# Patient Record
Sex: Female | Born: 1955 | Race: Black or African American | Hispanic: No | Marital: Married | Smoking: Former smoker
Health system: Western US, Academic
[De-identification: ages and names within clinical notes are randomized; demographics above are authoritative.]

## PROBLEM LIST (undated history)

## (undated) ENCOUNTER — Ambulatory Visit (HOSPITAL_BASED_OUTPATIENT_CLINIC_OR_DEPARTMENT_OTHER)
Admission: RE | Payer: No Typology Code available for payment source | Source: Ambulatory Visit | Admitting: Pain Medicine

## (undated) ENCOUNTER — Encounter (HOSPITAL_BASED_OUTPATIENT_CLINIC_OR_DEPARTMENT_OTHER): Admission: RE | Payer: Self-pay | Source: Ambulatory Visit

## (undated) DIAGNOSIS — I10 Essential (primary) hypertension: Secondary | ICD-10-CM

## (undated) DIAGNOSIS — R011 Cardiac murmur, unspecified: Secondary | ICD-10-CM

## (undated) DIAGNOSIS — E119 Type 2 diabetes mellitus without complications: Secondary | ICD-10-CM

## (undated) DIAGNOSIS — M1990 Unspecified osteoarthritis, unspecified site: Secondary | ICD-10-CM

## (undated) DIAGNOSIS — K5792 Diverticulitis of intestine, part unspecified, without perforation or abscess without bleeding: Secondary | ICD-10-CM

## (undated) DIAGNOSIS — D869 Sarcoidosis, unspecified: Secondary | ICD-10-CM

## (undated) DIAGNOSIS — D860 Sarcoidosis of lung: Secondary | ICD-10-CM

## (undated) HISTORY — PX: CHOLECYSTECTOMY: SHX55

## (undated) HISTORY — PX: ABDOMINAL EXPLORATION SURGERY: SHX538

## (undated) HISTORY — PX: ABDOMINAL HYSTERECTOMY: SHX81

## (undated) HISTORY — PX: APPENDECTOMY: SHX54

## (undated) HISTORY — PX: KNEE SURGERY: SHX244

## (undated) HISTORY — PX: HYSTERECTOMY: SHX81

## (undated) HISTORY — PX: KNEE ARTHROSCOPY: SHX1858B

## (undated) HISTORY — DX: Essential (primary) hypertension: I10

## (undated) HISTORY — DX: Sarcoidosis of lung (CMS-HCC): D86.0

## (undated) HISTORY — DX: Type 2 diabetes mellitus without complications (CMS-HCC): E11.9

## (undated) SURGERY — PAIN TPI (2 OR MORE MUSCLES)
Laterality: Bilateral

## (undated) MED ORDER — ALPRAZOLAM 0.5 MG OR TABS
ORAL_TABLET | ORAL | Status: AC
Start: 2020-10-12 — End: ?

## (undated) MED ORDER — LOSARTAN POTASSIUM 50 MG OR TABS
50.00 mg | ORAL_TABLET | Freq: Every day | ORAL | 0 refills | Status: AC
Start: 2022-02-18 — End: ?

## (undated) MED ORDER — INSULIN LISPRO (1 UNIT DIAL) 100 UNIT/ML SC SOPN
PEN_INJECTOR | SUBCUTANEOUS | 5 refills | Status: AC
Start: 2021-09-20 — End: ?

## (undated) MED ORDER — LOSARTAN POTASSIUM 50 MG OR TABS
50.00 mg | ORAL_TABLET | Freq: Every day | ORAL | 0 refills | Status: AC
Start: 2022-07-20 — End: ?

## (undated) MED ORDER — PREGABALIN 75 MG OR CAPS
ORAL_CAPSULE | ORAL | Status: AC
Start: 2021-11-05 — End: ?

## (undated) MED ORDER — PAROXETINE HCL 10 MG OR TABS
10.0000 mg | ORAL_TABLET | Freq: Every day | ORAL | 1 refills | Status: AC
Start: 2020-11-16 — End: ?

## (undated) MED ORDER — DEXCOM G6 SENSOR MISC
4 refills | Status: AC
Start: 2020-12-12 — End: ?

## (undated) MED ORDER — INSULIN GLARGINE SOLOSTAR 100 UNIT/ML SC SOPN
55.00 [IU] | PEN_INJECTOR | Freq: Every evening | SUBCUTANEOUS | 3 refills | Status: AC
Start: 2021-10-30 — End: ?

## (undated) MED ORDER — PREDNISONE 10 MG OR TABS
10.00 mg | ORAL_TABLET | Freq: Every day | ORAL | 1 refills | Status: AC
Start: 2021-05-16 — End: ?

## (undated) MED ORDER — ALPRAZOLAM 0.5 MG OR TABS
0.2500 mg | ORAL_TABLET | Freq: Every evening | ORAL | 0 refills | Status: AC | PRN
Start: 2020-11-16 — End: ?

## (undated) MED ORDER — TECHLITE PEN NEEDLES 32G X 4 MM MISC
1.00 | Freq: Every day | 3 refills | Status: AC
Start: 2021-10-30 — End: ?

## (undated) MED ORDER — LANTUS SOLOSTAR 100 UNIT/ML SC SOLN
PEN_INJECTOR | SUBCUTANEOUS | 0 refills | Status: AC
Start: 2022-05-27 — End: ?

## (undated) MED ORDER — PREGABALIN 75 MG OR CAPS
ORAL_CAPSULE | ORAL | Status: AC
Start: 2021-04-04 — End: ?

## (undated) MED ORDER — DEXCOM G6 RECEIVER DEVI
0 refills | Status: AC
Start: 2020-11-30 — End: ?

## (undated) MED ORDER — MUPIROCIN 2 % EX OINT
1.00 | TOPICAL_OINTMENT | Freq: Two times a day (BID) | CUTANEOUS | 1 refills | Status: AC
Start: 2021-09-20 — End: ?

## (undated) MED ORDER — OXYCODONE-ACETAMINOPHEN 10-325 MG OR TABS
0.50 | ORAL_TABLET | Freq: Four times a day (QID) | ORAL | 0 refills | Status: AC | PRN
Start: 2021-10-30 — End: ?

## (undated) MED ORDER — ELIQUIS 5 MG PO TABS
5.0000 mg | ORAL_TABLET | Freq: Two times a day (BID) | ORAL | 1 refills | Status: AC
Start: 2023-03-19 — End: ?

## (undated) MED ORDER — DEXCOM G6 TRANSMITTER MISC
0 refills | Status: AC
Start: 2020-10-26 — End: ?

## (undated) MED ORDER — MIRTAZAPINE 15 MG OR TABS
15.0000 mg | ORAL_TABLET | Freq: Every evening | ORAL | 1 refills | Status: AC | PRN
Start: 2022-03-19 — End: ?

## (undated) MED ORDER — DEXCOM G6 TRANSMITTER MISC
4 refills | Status: AC
Start: 2020-12-12 — End: ?

## (undated) MED ORDER — DEXCOM G6 SENSOR MISC
0 refills | Status: AC
Start: 2020-10-26 — End: ?

## (undated) MED ORDER — LOSARTAN POTASSIUM 50 MG OR TABS
ORAL_TABLET | ORAL | 1 refills | Status: AC
Start: 2021-02-05 — End: ?

## (undated) MED ORDER — OXYCODONE-ACETAMINOPHEN 10-325 MG OR TABS
1.0000 | ORAL_TABLET | Freq: Three times a day (TID) | ORAL | 0 refills | Status: AC | PRN
Start: 2020-10-12 — End: ?

## (undated) MED ORDER — INSULIN GLARGINE SOLOSTAR 100 UNIT/ML SC SOPN
PEN_INJECTOR | SUBCUTANEOUS | 0 refills | Status: AC
Start: 2021-09-25 — End: ?

## (undated) MED ORDER — HYDROXYZINE HCL 50 MG OR TABS
ORAL_TABLET | ORAL | 1 refills | Status: AC
Start: 2020-11-23 — End: ?

## (undated) MED ORDER — LOSARTAN POTASSIUM 50 MG OR TABS
50.0000 mg | ORAL_TABLET | Freq: Every day | ORAL | 3 refills | Status: AC
Start: 2020-11-05 — End: ?

## (undated) MED ORDER — GUAIFENESIN-CODEINE 100-10 MG/5ML OR SOLN
5.00 mL | Freq: Four times a day (QID) | ORAL | 0 refills | Status: AC | PRN
Start: 2022-02-06 — End: ?

## (undated) MED ORDER — NOVOLOG FLEXPEN 100 UNIT/ML SC SOPN
PEN_INJECTOR | SUBCUTANEOUS | 11 refills | Status: AC
Start: 2021-06-10 — End: ?

## (undated) MED ORDER — OXYCODONE-ACETAMINOPHEN 10-325 MG OR TABS
0.5000 | ORAL_TABLET | Freq: Four times a day (QID) | ORAL | 0 refills | Status: AC | PRN
Start: 2021-10-21 — End: ?

## (undated) MED ORDER — PREGABALIN 75 MG OR CAPS
ORAL_CAPSULE | ORAL | 2 refills | Status: AC
Start: 2021-10-21 — End: ?

## (undated) MED ORDER — ZOSTER VAC RECOMB ADJUVANTED 50 MCG/0.5ML IM SUSR
0.50 mL | Freq: Once | INTRAMUSCULAR | 1 refills | Status: AC
Start: 2020-07-09 — End: 2020-07-09

---

## 2015-11-01 ENCOUNTER — Observation Stay: Payer: Self-pay

## 2015-11-01 ENCOUNTER — Encounter: Payer: Self-pay | Admitting: Medical Oncology

## 2015-11-01 ENCOUNTER — Emergency Department: Payer: Self-pay

## 2015-11-01 ENCOUNTER — Observation Stay
Admission: EM | Admit: 2015-11-01 | Discharge: 2015-11-02 | Disposition: A | Discharge status: Home / Self Care | Payer: Self-pay | Attending: Internal Medicine | Admitting: Internal Medicine

## 2015-11-01 DIAGNOSIS — D869 Sarcoidosis, unspecified: Secondary | ICD-10-CM | POA: Insufficient documentation

## 2015-11-01 DIAGNOSIS — G459 Transient cerebral ischemic attack, unspecified: Secondary | ICD-10-CM

## 2015-11-01 DIAGNOSIS — Z882 Allergy status to sulfonamides status: Secondary | ICD-10-CM | POA: Insufficient documentation

## 2015-11-01 DIAGNOSIS — I700 Atherosclerosis of aorta: Secondary | ICD-10-CM | POA: Insufficient documentation

## 2015-11-01 DIAGNOSIS — K5730 Diverticulosis of large intestine without perforation or abscess without bleeding: Secondary | ICD-10-CM | POA: Insufficient documentation

## 2015-11-01 DIAGNOSIS — E119 Type 2 diabetes mellitus without complications: Secondary | ICD-10-CM | POA: Insufficient documentation

## 2015-11-01 DIAGNOSIS — I10 Essential (primary) hypertension: Secondary | ICD-10-CM | POA: Insufficient documentation

## 2015-11-01 DIAGNOSIS — Z90710 Acquired absence of both cervix and uterus: Secondary | ICD-10-CM | POA: Insufficient documentation

## 2015-11-01 DIAGNOSIS — F1910 Other psychoactive substance abuse, uncomplicated: Secondary | ICD-10-CM

## 2015-11-01 DIAGNOSIS — R51 Headache: Secondary | ICD-10-CM

## 2015-11-01 DIAGNOSIS — I371 Nonrheumatic pulmonary valve insufficiency: Secondary | ICD-10-CM | POA: Insufficient documentation

## 2015-11-01 DIAGNOSIS — I083 Combined rheumatic disorders of mitral, aortic and tricuspid valves: Secondary | ICD-10-CM | POA: Insufficient documentation

## 2015-11-01 DIAGNOSIS — R0789 Other chest pain: Secondary | ICD-10-CM

## 2015-11-01 DIAGNOSIS — E785 Hyperlipidemia, unspecified: Secondary | ICD-10-CM | POA: Insufficient documentation

## 2015-11-01 DIAGNOSIS — R079 Chest pain, unspecified: Secondary | ICD-10-CM

## 2015-11-01 DIAGNOSIS — Z9049 Acquired absence of other specified parts of digestive tract: Secondary | ICD-10-CM | POA: Insufficient documentation

## 2015-11-01 DIAGNOSIS — R42 Dizziness and giddiness: Secondary | ICD-10-CM | POA: Insufficient documentation

## 2015-11-01 DIAGNOSIS — R202 Paresthesia of skin: Secondary | ICD-10-CM

## 2015-11-01 DIAGNOSIS — R011 Cardiac murmur, unspecified: Secondary | ICD-10-CM | POA: Insufficient documentation

## 2015-11-01 DIAGNOSIS — G43909 Migraine, unspecified, not intractable, without status migrainosus: Principal | ICD-10-CM | POA: Insufficient documentation

## 2015-11-01 DIAGNOSIS — R1032 Left lower quadrant pain: Secondary | ICD-10-CM

## 2015-11-01 DIAGNOSIS — Z79899 Other long term (current) drug therapy: Secondary | ICD-10-CM | POA: Insufficient documentation

## 2015-11-01 DIAGNOSIS — G894 Chronic pain syndrome: Secondary | ICD-10-CM | POA: Insufficient documentation

## 2015-11-01 DIAGNOSIS — Z7982 Long term (current) use of aspirin: Secondary | ICD-10-CM | POA: Insufficient documentation

## 2015-11-01 DIAGNOSIS — I4581 Long QT syndrome: Secondary | ICD-10-CM | POA: Insufficient documentation

## 2015-11-01 DIAGNOSIS — R519 Headache, unspecified: Secondary | ICD-10-CM

## 2015-11-01 DIAGNOSIS — M1990 Unspecified osteoarthritis, unspecified site: Secondary | ICD-10-CM | POA: Insufficient documentation

## 2015-11-01 HISTORY — DX: Unspecified osteoarthritis, unspecified site: M19.90

## 2015-11-01 HISTORY — DX: Diverticulitis of intestine, part unspecified, without perforation or abscess without bleeding: K57.92

## 2015-11-01 HISTORY — DX: Essential (primary) hypertension: I10

## 2015-11-01 HISTORY — DX: Cardiac murmur, unspecified: R01.1

## 2015-11-01 HISTORY — DX: Sarcoidosis, unspecified: D86.9

## 2015-11-01 LAB — DIFFERENTIAL
BASOS ABS: 0.1 10*3/uL (ref 0–0.1)
BASOS PCT: 2 %
EOS ABS: 0.2 10*3/uL (ref 0–0.7)
Eosinophils Relative: 4 %
Lymphocytes Relative: 32 %
Lymphs Abs: 1.8 10*3/uL (ref 1.0–3.6)
MONOS PCT: 14 %
Monocytes Absolute: 0.8 10*3/uL (ref 0.2–0.9)
NEUTROS ABS: 2.7 10*3/uL (ref 1.4–6.5)
Neutrophils Relative %: 48 %

## 2015-11-01 LAB — URINE DRUG SCREEN, QUALITATIVE (ARMC ONLY)
Amphetamines, Ur Screen: NOT DETECTED
BARBITURATES, UR SCREEN: NOT DETECTED
BENZODIAZEPINE, UR SCRN: NOT DETECTED
CANNABINOID 50 NG, UR ~~LOC~~: NOT DETECTED
Cocaine Metabolite,Ur ~~LOC~~: NOT DETECTED
MDMA (ECSTASY) UR SCREEN: NOT DETECTED
Methadone Scn, Ur: NOT DETECTED
Opiate, Ur Screen: POSITIVE — AB
PHENCYCLIDINE (PCP) UR S: NOT DETECTED
TRICYCLIC, UR SCREEN: POSITIVE — AB

## 2015-11-01 LAB — CBC
HCT: 39.7 % (ref 35.0–47.0)
Hemoglobin: 13.4 g/dL (ref 12.0–16.0)
MCH: 29.5 pg (ref 26.0–34.0)
MCHC: 33.8 g/dL (ref 32.0–36.0)
MCV: 87.1 fL (ref 80.0–100.0)
PLATELETS: 241 10*3/uL (ref 150–440)
RBC: 4.56 MIL/uL (ref 3.80–5.20)
RDW: 13.8 % (ref 11.5–14.5)
WBC: 5.5 10*3/uL (ref 3.6–11.0)

## 2015-11-01 LAB — COMPREHENSIVE METABOLIC PANEL
ALT: 19 U/L (ref 14–54)
AST: 21 U/L (ref 15–41)
Albumin: 4.5 g/dL (ref 3.5–5.0)
Alkaline Phosphatase: 78 U/L (ref 38–126)
Anion gap: 8 (ref 5–15)
BUN: 15 mg/dL (ref 6–20)
CHLORIDE: 103 mmol/L (ref 101–111)
CO2: 27 mmol/L (ref 22–32)
CREATININE: 0.66 mg/dL (ref 0.44–1.00)
Calcium: 9.5 mg/dL (ref 8.9–10.3)
Glucose, Bld: 88 mg/dL (ref 65–99)
Potassium: 3.8 mmol/L (ref 3.5–5.1)
Sodium: 138 mmol/L (ref 135–145)
TOTAL PROTEIN: 7.8 g/dL (ref 6.5–8.1)
Total Bilirubin: 0.6 mg/dL (ref 0.3–1.2)

## 2015-11-01 LAB — PROTIME-INR
INR: 0.97
PROTHROMBIN TIME: 12.9 s (ref 11.4–15.2)

## 2015-11-01 LAB — TROPONIN I

## 2015-11-01 LAB — SEDIMENTATION RATE: Sed Rate: 23 mm/hr (ref 0–30)

## 2015-11-01 LAB — APTT: APTT: 31 s (ref 24–36)

## 2015-11-01 LAB — GLUCOSE, CAPILLARY: Glucose-Capillary: 71 mg/dL (ref 65–99)

## 2015-11-01 MED ORDER — BUTALBITAL-APAP-CAFFEINE 50-325-40 MG PO TABS
1.0000 | ORAL_TABLET | Freq: Once | ORAL | Status: AC
  Administered 2015-11-01: 20:00:00 1 via ORAL
  Filled 2015-11-01: qty 1

## 2015-11-01 MED ORDER — STROKE: EARLY STAGES OF RECOVERY BOOK
Freq: Once | Status: AC
  Administered 2015-11-01: 20:00:00

## 2015-11-01 MED ORDER — SENNOSIDES-DOCUSATE SODIUM 8.6-50 MG PO TABS: 1.0000 | ORAL_TABLET | Freq: Every evening | ORAL | Status: DC | PRN

## 2015-11-01 MED ORDER — KETOROLAC TROMETHAMINE 30 MG/ML IJ SOLN
30.0000 mg | Freq: Once | INTRAMUSCULAR | Status: DC
Start: 2015-11-01 — End: 2015-11-02
  Filled 2015-11-01 (×2): qty 1

## 2015-11-01 MED ORDER — LOSARTAN POTASSIUM 50 MG PO TABS
100.0000 mg | ORAL_TABLET | Freq: Every day | ORAL | Status: DC
  Administered 2015-11-02: 100 mg via ORAL
  Filled 2015-11-01: qty 2

## 2015-11-01 MED ORDER — OXYCODONE HCL 5 MG PO TABS
5.0000 mg | ORAL_TABLET | Freq: Four times a day (QID) | ORAL | Status: DC | PRN
  Administered 2015-11-01 – 2015-11-02 (×2): 5 mg via ORAL
  Filled 2015-11-01 (×2): qty 1

## 2015-11-01 MED ORDER — ASPIRIN EC 81 MG PO TBEC
81.0000 mg | DELAYED_RELEASE_TABLET | Freq: Every day | ORAL | Status: DC
Start: 2015-11-02 — End: 2015-11-02
  Administered 2015-11-02: 11:00:00 81 mg via ORAL
  Filled 2015-11-01: qty 1

## 2015-11-01 MED ORDER — ENOXAPARIN SODIUM 40 MG/0.4ML ~~LOC~~ SOLN
40.0000 mg | SUBCUTANEOUS | Status: DC
  Administered 2015-11-01: 40 mg via SUBCUTANEOUS
  Filled 2015-11-01: qty 0.4

## 2015-11-01 MED ORDER — SODIUM CHLORIDE 0.9 % IV SOLN
INTRAVENOUS | Status: DC
  Administered 2015-11-01: 20:00:00 via INTRAVENOUS

## 2015-11-01 MED ORDER — ONDANSETRON HCL 4 MG/2ML IJ SOLN
4.0000 mg | Freq: Once | INTRAMUSCULAR | Status: AC
  Administered 2015-11-01: 4 mg via INTRAVENOUS
  Filled 2015-11-01: qty 2

## 2015-11-01 MED ORDER — MORPHINE SULFATE (PF) 4 MG/ML IV SOLN
4.0000 mg | Freq: Once | INTRAVENOUS | Status: AC
  Administered 2015-11-01: 4 mg via INTRAVENOUS
  Filled 2015-11-01: qty 1

## 2015-11-01 NOTE — ED Notes (Signed)
[facility]

## 2015-11-01 NOTE — ED Notes (Signed)
[facility]16. Notified that it was specimens from code stroke.

## 2015-11-01 NOTE — ED Notes (Signed)
Pt back from CT

## 2015-11-01 NOTE — ED Triage Notes (Signed)
Pt reports that she began having headache around 1130 today. Pt states that with the headache she began having dizziness and tingling to the left side of her face. Pt talking in complete sentences.

## 2015-11-01 NOTE — H&P (Signed)
[place] @ [facility] Admission History and Physical [employer], D.O.  ---------------------------------------------------------------------------------------------------------------------   PATIENT NAMEJaquila Woods MR#: [id number] DATE OF BIRTH: Nov 15, 1955 DATE OF ADMISSION: 11/01/2015 PRIMARY CARE PHYSICIAN: No PCP Per Patient  REQUESTING/REFERRING PHYSICIAN: ED Dr. Cinda Quest   CHIEF COMPLAINT: Chief Complaint  Patient presents with  . Migraine  . Numbness    HISTORY OF PRESENT ILLNESS: Adriana Woods is a 60 y.o. female with a known history of HTN, sarcoidosis, OA was in a usual state of health until  11:30 this morning when she began experiencing headache associated with dizziness and tingling on the left side of her face in the temporal region. Symptoms are also associated with photophobia, but no nausea or vomiting.    She also reports that as the day is going on her left left feels "heavy" and weak.   She also reports central chest pressure which she states is chronic for her and is usually attributed to a strained muscle or reflux.  Pain is dull aching, nonradiating and not associated with SOB or palpitations.    Finally she complains of left lower quadrant abdominal pain similar to her diverticulitis in the past. Bowel movements have been normal, nonbloody with no diarrhea.   She has recently travelled to and from [place] for a funeral where she was in contact with her sick sister, but she herself has not been ill.   Otherwise there has been no change in status. Patient has been taking medication as prescribed and there has been no recent change in medication or diet.    Patient denies fevers/chills, weakness, shortness of breath, N/V/C/D, dysuria/frequency, changes in mental status.   PAST MEDICAL HISTORY: Past Medical History:  Diagnosis Date  . Arthritis   . Diverticulitis   . Heart murmur   . Hypertension   . Sarcoidosis ([place])        PAST SURGICAL HISTORY: Past Surgical History:  Procedure Laterality Date  . ABDOMINAL EXPLORATION SURGERY    . ABDOMINAL HYSTERECTOMY    . APPENDECTOMY    . CHOLECYSTECTOMY    . KNEE SURGERY        SOCIAL HISTORY: Social History  Substance Use Topics  . Smoking status: Never Smoker  . Smokeless tobacco: Never Used  . Alcohol use No     FAMILY HISTORY: No family history on file.   MEDICATIONS AT HOME: Prior to Admission medications   Medication Sig Start Date End Date Taking? Authorizing Provider  aspirin EC 81 MG tablet Take 81 mg by mouth daily.   Yes Historical Provider, MD  losartan (COZAAR) 100 MG tablet Take 100 mg by mouth daily.   Yes Historical Provider, MD      DRUG ALLERGIES: Allergies  Allergen Reactions  . Sulfa Antibiotics Itching and Swelling     REVIEW OF SYSTEMS: CONSTITUTIONAL: No fever/chills. No fatigue, weakness. No weight gain, no weight loss. (+) headache EYES: No blurry or double vision. (+) photophobia ENT: No tinnitus. No postnasal drip. No redness or soreness of the oropharynx. RESPIRATORY: No cough, no wheeze, no hemoptysis. No dyspnea. CARDIOVASCULAR: (+) chest pain. No orthopnea. No palpitations. No syncope. GASTROINTESTINAL: No nausea, no vomiting or diarrhea. No abdominal pain. No melena or hematochezia. GENITOURINARY: No dysuria or hematuria. ENDOCRINE: No polyuria or nocturia. No heat or cold intolerance. HEMATOLOGY: No anemia. No bruising. No bleeding. INTEGUMENTARY: No rashes. No lesions. MUSCULOSKELETAL: No arthritis. No swelling. No gout. NEUROLOGIC: (+) numbness/tingling, weakness or ataxia. No seizure-type activity. PSYCHIATRIC: No  anxiety. No depression. No insomnia.  PHYSICAL EXAMINATION: VITAL SIGNS: Blood pressure (!) 143/82, pulse 88, temperature 97.9 F (36.6 C), resp. rate 14, height 5' 4" (1.626 m), weight 88.9 kg (196 lb), SpO2 98 %.  GENERAL: 60 y.o.-year-old black female patient, well-developed,  well-nourished lying in the bed in no acute distress. EYES: Pupils equal, round, reactive to light and accommodation. (+) light sensitivity No scleral icterus. Extraocular muscles intact. HEENT: Head atraumatic, normocephalic. Oropharynx and nasopharynx clear. Mucus membranes moist.  (+) left temporal tenderness to palpation.   NECK: Supple, full range of motion. No JVD, no bruit heard. No thyroid enlargement, no tenderness, no lymphadenopathy. CHEST: Normal breath sounds bilaterally, no wheezing, rales, rhonchi or crepitation. No use of accessory muscles of respiration.  No reproducible chest wall tenderness.  CARDIOVASCULAR: S1, S2 normal. No murmurs, rubs, or gallops. Cap refill <2 seconds. ABDOMEN: Soft, nontender, nondistended. No rebound, guarding, rigidity. Normoactive bowel sounds present in all four quadrants. No organomegaly or mass. EXTREMITIES: Full range of motion. No pedal edema, cyanosis, or clubbing.  NEUROLOGIC: Cranial nerves II through XII are grossly intact with no focal sensorimotor deficit. Muscle strength 5/5 in all extremities. Sensation intact. Gait not checked.  RLE strength 5/5, LLE strength 4/5.  PSYCHIATRIC: The patient is alert and oriented x 3. Normal affect, mood, thought content. SKIN: Warm, dry, and intact without obvious rash, lesion, or ulcer.  LABORATORY PANEL:  CBC  Recent Labs Lab 11/01/15 1612  WBC 5.5  HGB 13.4  HCT 39.7  PLT 241   ----------------------------------------------------------------------------------------------------------------- Chemistries  Recent Labs Lab 11/01/15 1612  NA 138  K 3.8  CL 103  CO2 27  GLUCOSE 88  BUN 15  CREATININE 0.66  CALCIUM 9.5  AST 21  ALT 19  ALKPHOS 78  BILITOT 0.6   ------------------------------------------------------------------------------------------------------------------ Cardiac Enzymes  Recent Labs Lab 11/01/15 1612  TROPONINI <0.03    ------------------------------------------------------------------------------------------------------------------  RADIOLOGY: Ct Head Code Stroke W/o Cm  Result Date: 11/01/2015 CLINICAL DATA:  Code stroke. 60 year old female with headache, dizziness and left face tingling beginning at 1130 hours today. Initial encounter. EXAM: CT HEAD WITHOUT CONTRAST TECHNIQUE: Contiguous axial images were obtained from the base of the skull through the vertex without intravenous contrast. COMPARISON:  None. FINDINGS: Mild right sphenoid sinus mucosal thickening. Other visible sinuses and mastoid air cells are clear. No acute osseous abnormality identified. Negative orbit and scalp soft tissues. Calcified atherosclerosis at the skull base. Cerebral volume is within normal limits for age. No midline shift, ventriculomegaly, mass effect, evidence of mass lesion, intracranial hemorrhage or evidence of cortically based acute infarction. Gray-white matter differentiation is within normal limits throughout the brain. No suspicious intracranial vascular hyperdensity. ASPECTS Total score (0-10 with 10 being normal): 10 IMPRESSION: 1. Normal for age noncontrast CT appearance of the brain. 2. ASPECTS score 10 3. Study discussed by telephone with Dr. Wells Guiles LORD on 11/01/2015 at 16:26 . Electronically Signed   By: Genevie Ann M.D.   On: 11/01/2015 16:28    EKG: Sinus with LAD and non specific ST T wave changes.    IMPRESSION AND PLAN:  This is a 60 y.o. female with a history of HTN. OA, sarcoidosis now being admitted with: 1. TIA - Teleneurology recommendations appreciated.  Will admit to telemetry for workup including MRI, MRA, echo, carotids, lipids, TFTs. Aspirin already rec'd in ED will continue 81 mg daily.  2. Chest pain rule out ACS - trend troponins, cardiology consult.  3. Headache - likely migraine, rule out  temporal arteritis.  Will give trial of Fioricet and check ESR.  4. LLQ abdominal pain - Chest CT abdomen/  pelvis, UA.   Fluids: IVNS Diet/Nutrition: Heart healthy pending swallow screen DVT Px: Lovenox, SCDs  All the records are reviewed and case discussed with ED provider. Management plans discussed with the patient and/or family who express understanding and agree with plan of care.  CODE STATUS: Full  TOTAL TIME TAKING CARE OF THIS PATIENT: 60 minutes.   Ajayla Iglesias D.O. on 11/01/2015 at 5:56 PM Between 7am to 6pm - Pager - [phone] After 6pm go to [email or web address] - [employer] [place] [facility] Office [phone] CC: Primary care physician; No PCP Per Patient     Note: This dictation was prepared with Dragon dictation along with smaller phrase technology. Any transcriptional errors that result from this process are unintentional.

## 2015-11-01 NOTE — ED Notes (Signed)
EDP at bedside. Pt reports headache left sided facial numbness and tingling that began at 1100 today. Pt reports neck tenderness.

## 2015-11-01 NOTE — ED Notes (Signed)
Report to Malka, RN.  

## 2015-11-01 NOTE — ED Provider Notes (Signed)
[facility] Department Provider Note   ____________________________________________   First MD Initiated Contact with Patient 11/01/15 1621     (approximate)  I have reviewed the triage vital signs and the nursing notes.   HISTORY  Chief Complaint Migraine and Numbness    HPI Adriana Woods is a 60 y.o. female who reports gradual onset of a headache starting with neck tightness progressing up into the head at about 11:30 this morning she did then became somewhat dizzy feeling and had tingling in the left side of her face. At present she still has a bad headache which is mostly left-sided and tingling on the left side of her face. The headache is just a headache. Tingling feels like her face made a call to sleep. She thinks she may have a little bit of left leg weakness. Patient says the tingling is better than it was. Patient also reports a little bit of chest heaviness and left lower quadrant abdominal pain. The left lower quadrant abdominal pain has been going on for several days. She says she has a history of diabetes particular is.   Past Medical History:  Diagnosis Date  . Arthritis   . Diverticulitis   . Heart murmur   . Hypertension   . Sarcoidosis ([facility])     There are no active problems to display for this patient.   Past Surgical History:  Procedure Laterality Date  . ABDOMINAL EXPLORATION SURGERY    . ABDOMINAL HYSTERECTOMY    . APPENDECTOMY    . CHOLECYSTECTOMY    . KNEE SURGERY      Prior to Admission medications   Medication Sig Start Date End Date Taking? Authorizing Provider  aspirin EC 81 MG tablet Take 81 mg by mouth daily.   Yes Historical Provider, MD  losartan (COZAAR) 100 MG tablet Take 100 mg by mouth daily.   Yes Historical Provider, MD    Allergies Sulfa antibiotics  No family history on file.  Social History Social History  Substance Use Topics  . Smoking status: Never Smoker  . Smokeless tobacco:  Never Used  . Alcohol use No    Review of Systems Constitutional: No fever/chills Eyes: No visual changes. ENT: No sore throat. Cardiovascular:chest pain. Respiratory: Denies shortness of breath. Gastrointestinal: No abdominal pain.  No nausea, no vomiting.  No diarrhea.  No constipation. Genitourinary: Negative for dysuria. Musculoskeletal: Negative for back pain. Skin: Negative for rash. Neurological: Negative for headaches, focal weakness or numbness.  10-point ROS otherwise negative.  ____________________________________________   PHYSICAL EXAM:  VITAL SIGNS: ED Triage Vitals  Enc Vitals Group     BP 11/01/15 1603 132/73     Pulse Rate 11/01/15 1603 84     Resp 11/01/15 1603 18     Temp 11/01/15 1603 97.9 F (36.6 C)     Temp Source 11/01/15 1603 Oral     SpO2 11/01/15 1603 100 %     Weight 11/01/15 1603 196 lb (88.9 kg)     Height 11/01/15 1603 5\' 4"  (1.626 m)     Head Circumference --      Peak Flow --      Pain Score 11/01/15 1610 8     Pain Loc --      Pain Edu? --      Excl. in GC? --     Constitutional: Alert and oriented. Well appearing and in no acute distress. Eyes: Conjunctivae are normal. PERRL. EOMI. Head: Atraumatic. Nose: No congestion/rhinnorhea. Mouth/Throat:  Mucous membranes are moist.  Oropharynx non-erythematous. Neck: No stridor.  Cardiovascular: Normal rate, regular rhythm. Grossly normal heart sounds.  Good peripheral circulation. Respiratory: Normal respiratory effort.  No retractions. Lungs CTAB. Gastrointestinal: Soft and nontenderExcept for minimally in the extreme left lower quadrant. No distention. No abdominal bruits. No CVA tenderness. Musculoskeletal: No lower extremity tenderness nor edema.  No joint effusions. Neurologic:  Normal speech and language. No gross focal neurologic deficits are appreciated. Cranial nerves II through XII are intact cerebellar finger-nose rapid alternating movements and hand are normal motor strength  is 5 over 5 throughout sensory patient reports some tingling in the left side of the face. Skin:  Skin is warm, dry and intact. No rash noted. Psychiatric: Mood and affect are normal. Speech and behavior are normal.  ____________________________________________   LABS (all labs ordered are listed, but only abnormal results are displayed)  Labs Reviewed  PROTIME-INR  APTT  CBC  DIFFERENTIAL  COMPREHENSIVE METABOLIC PANEL  TROPONIN I  GLUCOSE, CAPILLARY  CBG MONITORING, ED   ____________________________________________  EKG  EKG read and interpreted by me shows sinus rhythm rate of 9085 left axis no acute ST-T wave changes there is poor R-wave progression ____________________________________________  RADIOLOGY  CT read by radiology as no acute disease except for some sphenoid thickening ____________________________________________   PROCEDURES  Procedure(s) performed: Neurologist on call recommends admission for resolving ischemic deficit  Procedures  Critical Care performed:   ____________________________________________   INITIAL IMPRESSION / ASSESSMENT AND PLAN / ED COURSE  Pertinent labs & imaging results that were available during my care of the patient were reviewed by me and considered in my medical decision making (see chart for details).    Clinical Course     ____________________________________________   FINAL CLINICAL IMPRESSION(S) / ED DIAGNOSES  Final diagnoses:  Paresthesia  Left lower quadrant pain  Chest pain, unspecified chest pain type      NEW MEDICATIONS STARTED DURING THIS VISIT:  New Prescriptions   No medications on file     Note:  This document was prepared using Dragon voice recognition software and may include unintentional dictation errors.    Arnaldo Natal, MD 11/01/15 [phone]

## 2015-11-02 ENCOUNTER — Encounter: Payer: Self-pay | Admitting: Physician Assistant

## 2015-11-02 ENCOUNTER — Observation Stay: Payer: Self-pay

## 2015-11-02 ENCOUNTER — Observation Stay (HOSPITAL_BASED_OUTPATIENT_CLINIC_OR_DEPARTMENT_OTHER)
Admit: 2015-11-02 | Discharge: 2015-11-02 | Disposition: A | Discharge status: Home / Self Care | Payer: Self-pay | Attending: Family Medicine | Admitting: Family Medicine

## 2015-11-02 DIAGNOSIS — R1032 Left lower quadrant pain: Secondary | ICD-10-CM

## 2015-11-02 DIAGNOSIS — R519 Headache, unspecified: Secondary | ICD-10-CM

## 2015-11-02 DIAGNOSIS — G43909 Migraine, unspecified, not intractable, without status migrainosus: Secondary | ICD-10-CM

## 2015-11-02 DIAGNOSIS — R0789 Other chest pain: Secondary | ICD-10-CM

## 2015-11-02 DIAGNOSIS — R202 Paresthesia of skin: Secondary | ICD-10-CM

## 2015-11-02 DIAGNOSIS — R42 Dizziness and giddiness: Secondary | ICD-10-CM

## 2015-11-02 DIAGNOSIS — G459 Transient cerebral ischemic attack, unspecified: Secondary | ICD-10-CM

## 2015-11-02 DIAGNOSIS — F1910 Other psychoactive substance abuse, uncomplicated: Secondary | ICD-10-CM

## 2015-11-02 DIAGNOSIS — R51 Headache: Secondary | ICD-10-CM

## 2015-11-02 LAB — URINALYSIS COMPLETE WITH MICROSCOPIC (ARMC ONLY)
Bilirubin Urine: NEGATIVE
GLUCOSE, UA: NEGATIVE mg/dL
Ketones, ur: NEGATIVE mg/dL
Nitrite: NEGATIVE
Protein, ur: NEGATIVE mg/dL
RBC / HPF: NONE SEEN RBC/hpf (ref 0–5)
Specific Gravity, Urine: 1.008 (ref 1.005–1.030)
pH: 5 (ref 5.0–8.0)

## 2015-11-02 LAB — TROPONIN I
Troponin I: <0.03 ng/mL (ref <0.03)
Troponin I: <0.03 ng/mL (ref <0.03)

## 2015-11-02 LAB — THYROID PANEL WITH TSH
Free Thyroxine Index: 1.6 (ref 1.2–4.9)
T3 UPTAKE RATIO: 23 % — AB (ref 24–39)
T4 TOTAL: 7 ug/dL (ref 4.5–12.0)
TSH: 3.43 u[IU]/mL (ref 0.450–4.500)

## 2015-11-02 LAB — HEMOGLOBIN A1C: Hgb A1c MFr Bld: 7.7 % — ABNORMAL HIGH (ref 4.0–6.0)

## 2015-11-02 LAB — LIPID PANEL
Cholesterol: 292 mg/dL — ABNORMAL HIGH (ref 0–200)
HDL: 33 mg/dL — ABNORMAL LOW (ref >40)
LDL CALC: 203 mg/dL — AB (ref 0–99)
Total CHOL/HDL Ratio: 8.8 RATIO
Triglycerides: 278 mg/dL — ABNORMAL HIGH (ref <150)
VLDL: 56 mg/dL — AB (ref 0–40)

## 2015-11-02 LAB — ECHOCARDIOGRAM COMPLETE
HEIGHTINCHES: 65 in
WEIGHTICAEL: 3040 [oz_av]

## 2015-11-02 MED ORDER — MECLIZINE HCL 25 MG PO TABS: 25.0000 mg | ORAL_TABLET | Freq: Three times a day (TID) | ORAL | 0 refills | Status: AC | PRN

## 2015-11-02 MED ORDER — KETOROLAC TROMETHAMINE 30 MG/ML IJ SOLN
30.0000 mg | Freq: Once | INTRAMUSCULAR | Status: AC
  Administered 2015-11-02: 14:00:00 30 mg via INTRAVENOUS

## 2015-11-02 MED ORDER — DIPHENHYDRAMINE HCL 25 MG PO CAPS
25.0000 mg | ORAL_CAPSULE | Freq: Every evening | ORAL | Status: DC | PRN
  Administered 2015-11-02: 25 mg via ORAL
  Filled 2015-11-02: qty 1

## 2015-11-02 MED ORDER — BUTALBITAL-APAP-CAFFEINE 50-325-40 MG PO TABS: 1.0000 | ORAL_TABLET | ORAL | 0 refills | Status: AC | PRN

## 2015-11-02 MED ORDER — ORPHENADRINE CITRATE ER 100 MG PO TB12: 100.0000 mg | ORAL_TABLET | Freq: Two times a day (BID) | ORAL | 0 refills | Status: DC

## 2015-11-02 MED ORDER — LOSARTAN POTASSIUM 100 MG PO TABS: 100.0000 mg | ORAL_TABLET | Freq: Every day | ORAL | 0 refills | Status: DC

## 2015-11-02 MED ORDER — ORPHENADRINE CITRATE ER 100 MG PO TB12
100.0000 mg | ORAL_TABLET | Freq: Two times a day (BID) | ORAL | Status: DC
  Administered 2015-11-02: 11:00:00 100 mg via ORAL
  Filled 2015-11-02 (×2): qty 1

## 2015-11-02 MED ORDER — OXYCODONE-ACETAMINOPHEN 5-325 MG PO TABS: 1.0000 | ORAL_TABLET | Freq: Three times a day (TID) | ORAL | 0 refills | Status: DC | PRN

## 2015-11-02 MED ORDER — BUTALBITAL-APAP-CAFFEINE 50-325-40 MG PO TABS
1.0000 | ORAL_TABLET | ORAL | Status: DC | PRN
  Administered 2015-11-02: 08:00:00 1 via ORAL
  Filled 2015-11-02: qty 1

## 2015-11-02 MED ORDER — GADOBENATE DIMEGLUMINE 529 MG/ML IV SOLN
20.0000 mL | Freq: Once | INTRAVENOUS | Status: AC | PRN
  Administered 2015-11-02: 18 mL via INTRAVENOUS

## 2015-11-02 MED ORDER — MECLIZINE HCL 25 MG PO TABS: 25.0000 mg | ORAL_TABLET | Freq: Three times a day (TID) | ORAL | Status: DC | PRN

## 2015-11-02 NOTE — Progress Notes (Addendum)
OT Cancellation Note  Patient Details Name: Adriana Woods MRN: [id number] DOB: Jul 22, 1955   Cancelled Treatment:    Reason Eval/Treat Not Completed: Patient at procedure or test/ unavailable. Attempted to see x3. Pt out of room for MRI. Will attempt to complete evaluation/tx at later date/time when pt is available.  Eliezer BottomJamie L Stiller, OTR/L 11/02/2015, 10:59 AM

## 2015-11-02 NOTE — Care Management (Signed)
Placed in observation for sx concerning for TIA.  Work up is negative. Independent in all adls, denies issues accessing medical care, obtaining medications or with transportation.  Current with her PCP.  Provided her with application to med management and [facility] along with contacts for sliding scale clinics.  States she and her husband are unemployed

## 2015-11-02 NOTE — Progress Notes (Signed)
*  PRELIMINARY RESULTS* Echocardiogram 2D Echocardiogram has been performed.  Cristela BlueHege, Daviel Allegretto 11/02/2015, 8:29 AM

## 2015-11-02 NOTE — Consult Note (Signed)
Cardiology Consultation Note  Patient ID: Adriana Woods, MRN: [id number], DOB/AGE: 11/13/1955 60 y.o. Admit date: 11/01/2015   Date of Consult: 11/02/2015 Primary Physician: No PCP Per Patient Primary Cardiologist: New to [facility] Requesting Physician: Dr. Emmit Pomfret, MD  Chief Complaint: headache, long standing chest pain, abdominal pain Reason for Consult: Chest pain  HPI: 60 y.o. female with h/o HTN, sarcoidosis, migraine disorder, and chronic pain disorder who presented to [facility] with numerous complaints as above. Cardiology is asked to see her for chronic chest pain.   Patient developed a left sided migraine with associated photophobia and left-sided weakness. This led for her to come to the hospital. While in the ED she mentioned having a history of chronic chest pain that is nonexertional and has been felt to be 2/2 MSK and GERD. No associated symptoms. Lastly, she mentioned LLQ abdominal pain that has been c/w her prior diverticulitis.   Upon the patient's arrival to [facility] they were found to have negative troponin x 4, normal sed rate, normal thyroid studies, normal cmet, normal cbc, LDL 203. ECG as below, head CT negative, abdominal/pelvic CT nonacute, CXR showed showed possible PAH otherwise was negative. She is pending for MRI/MRA of the brain/neck as well as carotid ultrasound. EKG as below. Currently,    Past Medical History:  Diagnosis Date  . Arthritis   . Diverticulitis   . Heart murmur   . Hypertension   . Sarcoidosis (HCC)       Most Recent Cardiac Studies: none   Surgical History:  Past Surgical History:  Procedure Laterality Date  . ABDOMINAL EXPLORATION SURGERY    . ABDOMINAL HYSTERECTOMY    . APPENDECTOMY    . CHOLECYSTECTOMY    . KNEE SURGERY       Home Meds: Prior to Admission medications   Medication Sig Start Date End Date Taking? Authorizing Provider  aspirin EC 81 MG tablet Take 81 mg by mouth daily.   Yes Historical Provider, MD  losartan (COZAAR)  100 MG tablet Take 100 mg by mouth daily.   Yes Historical Provider, MD  butalbital-acetaminophen-caffeine (FIORICET, ESGIC) 50-325-40 MG tablet Take 1 tablet by mouth every 4 (four) hours as needed for headache or migraine. 11/02/15   Katharina Caper, MD  meclizine (ANTIVERT) 25 MG tablet Take 1 tablet (25 mg total) by mouth 3 (three) times daily as needed for dizziness. 11/02/15   Katharina Caper, MD  orphenadrine (NORFLEX) 100 MG tablet Take 1 tablet (100 mg total) by mouth 2 (two) times daily. 11/02/15   Katharina Caper, MD    Inpatient Medications:  . aspirin EC  81 mg Oral Daily  . enoxaparin (LOVENOX) injection  40 mg Subcutaneous Q24H  . ketorolac  30 mg Intravenous Once  . losartan  100 mg Oral Daily  . orphenadrine  100 mg Oral BID   . sodium chloride 75 mL/hr at 11/01/15 1953    Allergies:  Allergies  Allergen Reactions  . Sulfa Antibiotics Itching and Swelling    Social History   Social History  . Marital status: Married    Spouse name: N/A  . Number of children: N/A  . Years of education: N/A   Occupational History  . Not on file.   Social History Main Topics  . Smoking status: Never Smoker  . Smokeless tobacco: Never Used  . Alcohol use No  . Drug use: Unknown  . Sexual activity: Not on file   Other Topics Concern  . Not on file   Social  History Narrative  . No narrative on file     Family History  Problem Relation Age of Onset  . Hypertension Mother      Review of Systems: Review of Systems  Constitutional: Positive for malaise/fatigue and weight loss. Negative for chills, diaphoresis and fever.  HENT: Negative for congestion.   Eyes: Negative for discharge and redness.  Respiratory: Negative for cough, hemoptysis, sputum production, shortness of breath and wheezing.   Cardiovascular: Positive for chest pain. Negative for palpitations, orthopnea, claudication, leg swelling and PND.  Gastrointestinal: Positive for abdominal pain, heartburn and nausea.  Negative for blood in stool, melena and vomiting.  Genitourinary: Negative for hematuria.  Musculoskeletal: Positive for joint pain and myalgias. Negative for falls.  Skin: Negative for rash.  Neurological: Positive for dizziness, tingling, sensory change, focal weakness, weakness and headaches. Negative for tremors, speech change and loss of consciousness.  Endo/Heme/Allergies: Does not bruise/bleed easily.  Psychiatric/Behavioral: Positive for substance abuse. The patient is nervous/anxious.   All other systems reviewed and are negative.   Labs:  Recent Labs  11/01/15 1612 11/01/15 1849 11/02/15 0045 11/02/15 0701  TROPONINI <0.03 <0.03 <0.03 <0.03   Lab Results  Component Value Date   WBC 5.5 11/01/2015   HGB 13.4 11/01/2015   HCT 39.7 11/01/2015   MCV 87.1 11/01/2015   PLT 241 11/01/2015     Recent Labs Lab 11/01/15 1612  NA 138  K 3.8  CL 103  CO2 27  BUN 15  CREATININE 0.66  CALCIUM 9.5  PROT 7.8  BILITOT 0.6  ALKPHOS 78  ALT 19  AST 21  GLUCOSE 88   Lab Results  Component Value Date   CHOL 292 (H) 11/02/2015   HDL 33 (L) 11/02/2015   LDLCALC 203 (H) 11/02/2015   TRIG 278 (H) 11/02/2015   No results found for: DDIMER  Radiology/Studies:  Ct Abdomen Pelvis Wo Contrast  Result Date: 11/01/2015 CLINICAL DATA:  History of hypertension, sarcoidosis. Started experiencing headache, dizziness, left-sided facial tingling, photophobia. Left lower quadrant abdominal pain similar to diverticulitis in the past. Symptoms started at 11:30 a.m. EXAM: CT ABDOMEN AND PELVIS WITHOUT CONTRAST TECHNIQUE: Multidetector CT imaging of the abdomen and pelvis was performed following the standard protocol without IV contrast. COMPARISON:  None. FINDINGS: Lower chest: Small calcified granuloma is identified in the right middle lobe. There is mild bibasilar atelectasis or fibrosis at the lung bases. Hepatobiliary: Small calcified granulomata are identified within the liver. No  suspicious liver lesions are present. Gallbladder is surgically absent. Pancreas: Within normal noncontrast appearance. Spleen: Small calcified splenic granulomata.  Otherwise, normal. Renal/Adrenal: Adrenal glands are normal in appearance. No intrarenal calculi. No hydronephrosis. Gastrointestinal tract: The stomach and small bowel loops are normal in appearance. Numerous colonic diverticula are present. No acute diverticulitis. Status post appendectomy. Reproductive/Pelvis: Status post hysterectomy. No adnexal mass. No free pelvic fluid. Vascular/Lymphatic: There is atherosclerotic calcification of the abdominal aorta. No aneurysm. Small, nonspecific lymph nodes are identified in the upper abdomen, largest identified in the gastrohepatic ligament measuring 1.5 x 0.8 cm. Musculoskeletal/Abdominal wall: Abdominal wall is unremarkable. Visualized osseous structures have a normal appearance. Other: none IMPRESSION: 1.  No evidence for acute  abnormality. 2. Calcified granulomata within the liver, spleen. 3. Fibrotic changes at the lung bases. 4. Nonspecific small upper abdominal lymph nodes. 5. Colonic diverticulosis without evidence for acute diverticulitis. 6. Status post appendectomy, hysterectomy, cholecystectomy. Electronically Signed   By: Norva PavlovElizabeth  Brown M.D.   On: 11/01/2015 19:31   Dg  Chest 2 View  Result Date: 11/01/2015 CLINICAL DATA:  Central chest pain. Weakness and shortness of breath. Numbness in the face and neck. EXAM: CHEST  2 VIEW COMPARISON:  None. FINDINGS: Cardiomediastinal silhouette is normal. Mediastinal contours appear intact. There is prominence of the bilateral pulmonary arteries. There is no evidence of focal airspace consolidation, pleural effusion or pneumothorax. Osseous structures are without acute abnormality. Soft tissues are grossly normal. IMPRESSION: Prominence of the bilateral pulmonary arteries which may be seen with pulmonary arterial hypertension. No evidence of  cardiomegaly or pulmonary edema. Electronically Signed   By: Ted Mcalpine M.D.   On: 11/01/2015 19:36   Ct Head Code Stroke W/o Cm  Result Date: 11/01/2015 CLINICAL DATA:  Code stroke. 60 year old female with headache, dizziness and left face tingling beginning at 1130 hours today. Initial encounter. EXAM: CT HEAD WITHOUT CONTRAST TECHNIQUE: Contiguous axial images were obtained from the base of the skull through the vertex without intravenous contrast. COMPARISON:  None. FINDINGS: Mild right sphenoid sinus mucosal thickening. Other visible sinuses and mastoid air cells are clear. No acute osseous abnormality identified. Negative orbit and scalp soft tissues. Calcified atherosclerosis at the skull base. Cerebral volume is within normal limits for age. No midline shift, ventriculomegaly, mass effect, evidence of mass lesion, intracranial hemorrhage or evidence of cortically based acute infarction. Gray-white matter differentiation is within normal limits throughout the brain. No suspicious intracranial vascular hyperdensity. ASPECTS Total score (0-10 with 10 being normal): 10 IMPRESSION: 1. Normal for age noncontrast CT appearance of the brain. 2. ASPECTS score 10 3. Study discussed by telephone with Dr. Lurena Joiner LORD on 11/01/2015 at 16:26 . Electronically Signed   By: Odessa Fleming M.D.   On: 11/01/2015 16:28    EKG: Interpreted by me showed: NSR, 85 bpm, nonspecific inferolateral st/t changes. Today EKG NSR, 66 bpm, no acute st;t changes  Weights: Filed Weights   11/01/15 1603 11/01/15 1851  Weight: 196 lb (88.9 kg) 190 lb (86.2 kg)     Physical Exam: Blood pressure (!) 142/84, pulse 73, temperature 97.7 F (36.5 C), temperature source Oral, resp. rate (!) 24, height 5\' 5"  (1.651 m), weight 190 lb (86.2 kg), SpO2 100 %. Body mass index is 31.62 kg/m. General: Well developed, well nourished, in no acute distress. Head: Normocephalic, atraumatic, sclera non-icteric, no xanthomas, nares are  without discharge.  Neck: Negative for carotid bruits. JVD not elevated. Lungs: Clear bilaterally to auscultation without wheezes, rales, or rhonchi. Breathing is unlabored. Heart: RRR with S1 S2. No murmurs, rubs, or gallops appreciated. Abdomen: Soft, non-tender, non-distended with normoactive bowel sounds. No hepatomegaly. No rebound/guarding. No obvious abdominal masses. Msk:  Strength and tone appear normal for age. Extremities: No clubbing or cyanosis. No edema. Distal pedal pulses are 2+ and equal bilaterally. Neuro: Alert and oriented X 3. No facial asymmetry. No focal deficit. Moves all extremities spontaneously. Psych:  Responds to questions appropriately with a normal affect.    Assessment and Plan:  Principal Problem:   TIA (transient ischemic attack) Active Problems:   Headache   Dizziness   Tingling sensation   Drug abuse   Atypical chest pain   LLQ abdominal pain    1. Atypical chest pain: -Long standing and unchanged from prior -She has ruled out -Check echo -Can plan for outpatient nuclear stress testing  2. Migraine disorder, chronic pain syndrome, possible undiagnosed fibromyalgia: -Migraine was the patient's main reason for coming to the hospital  -Per IM -Labs do not reveal any acute pathology  to date  3. HLD:   -Recommend statin  4. TIA: -Per IM -MRI/MRA pedning   Signed, Eula Listen, PA-C [facility] Pager: [phone] 11/02/2015, 9:31 AM

## 2015-11-02 NOTE — Progress Notes (Signed)
SLP Note  Patient Details Name: Darin EngelsCharmaine Honsinger MRN: [id number] DOB: 04-Mar-1956   SLP NOTE:       Reason Eval/Treat Not Completed: SLP screened, no needs identified, will sign off Reviewed chart and spoke with nsg and pt. Pt is a 60 y.o. female with h/o HTN, sarcoidosis, migraine disorder, heart murmur, and chronic pain disorder who [facility] with headache, neck tightness, dizziness, tingling L side of face, L sided heaviness and weakness, long standing chest pain, and abdominal pain. Pt denies any dysphagia or speech/language deficits. Nsg reports pt has been eating without difficulty. No ST indicated at this time. Please re-consult if further concerns arise.    [place],Rifky Lapre 11/02/2015, 12:57 PM

## 2015-11-02 NOTE — Progress Notes (Signed)
Pt has been discharged home. Discharge instructions given and explained to pt. Pt verbalized understanding. Meds reviewed with pt. No F/U appointment at this time. Pt [facility] for [facility]. RX given. Pt escorted in a wheelchair.

## 2015-11-02 NOTE — Evaluation (Addendum)
Occupational Therapy Evaluation Patient Details Name: Adriana Woods MRN: [id number] DOB: 08/01/1955 Today's Date: 11/02/2015    History of Present Illness Pt is a 60 y.o. female was admitted with a TIA. Pt. PMHx includes: HTN, sarcoidosis, migraine disorder, heart murmur, and chronic pain disorder who [facility] with headache, neck tightness, dizziness, tingling L side of face, L sided heaviness and weakness, long standing chest pain, and abdominal pain.   Clinical Impression   Pt. Is a 60 y.o. Female who was admitted with a TIA. Pt. Presents with 7/10 headache pain which hinders participation in ADL/IADL tasks. Pt. Is able to complete basic ADL care needs. Pt. plans to return home with her husband today. No further OT services are indicated. No further treatment plan, or goals are warranted at this time Pt. Is in agreement.     Follow Up Recommendations       Equipment Recommendations       Recommendations for Other Services       Precautions / Restrictions Precautions Precautions: Fall Restrictions Weight Bearing Restrictions: No              ADL Overall ADL's : Independent                                       General ADL Comments: Pt. reports being independent with Basic ADL tasks.     Vision     Perception     Praxis      Pertinent Vitals/Pain Pain Assessment: 0-10 Pain Score: 7  Pain Location: Headache Pain Descriptors / Indicators: Headache Pain Intervention(s): Limited activity within patient's tolerance     Hand Dominance Right   Extremity/Trunk Assessment Upper Extremity Assessment Upper Extremity Assessment: Overall WFL for tasks assessed         Communication Communication Communication: No difficulties   Cognition Arousal/Alertness: Awake/alert Behavior During Therapy: WFL for tasks assessed/performed Overall Cognitive Status: Within Functional Limits for tasks assessed                      General Comments       Exercises       Shoulder Instructions      Home Living Family/patient expects to be discharged to:: Private residence Living Arrangements: Spouse/significant other Available Help at Discharge: Family Type of Home: House Home Access: Stairs to enter Secretary/administratorntrance Stairs-Number of Steps: 5 Entrance Stairs-Rails: Left Home Layout: One level     Bathroom Shower/Tub: Walk-in shower;Door     Bathroom Accessibility: Yes   Home Equipment: None          Prior Functioning/Environment Level of Independence: Independent        Comments: Pt reports not working currently.    OT Diagnosis:     OT Problem List:     OT Treatment/Interventions:      OT Goals(Current goals can be found in the care plan section) Acute Rehab OT Goals Patient Stated Goal: To go home and get some rest. OT Goal Formulation: With patient Time For Goal Achievement: 11/02/15 Potential to Achieve Goals: Good  OT Frequency:     Barriers to D/C:            Co-evaluation              End of Session    Activity Tolerance:   Pt. Tolerated session well. Patient left:  In bed with a  call bell close by.   Time: 1430-1445 OT Time Calculation (min): 15 min Charges:  OT General Charges $OT Visit: 1 Procedure OT Evaluation $OT Eval Low Complexity: 1 Procedure G-Codes: OT G-codes **NOT FOR INPATIENT CLASS** Functional Assessment Tool Used: clinical judgement based upon pt. current functional stautus. Functional Limitation: Self care Self Care Current Status [phone]): At least 1 percent but less than 20 percent impaired, limited or restricted Self Care Goal Status (O9629): At least 1 percent but less than 20 percent impaired, limited or restricted Self Care Discharge Status [phone]): At least 1 percent but less than 20 percent impaired, limited or restricted  Olegario Messier, MS, OTR/L 11/02/2015, 3:14 PM

## 2015-11-02 NOTE — Progress Notes (Signed)
[facility] - [place] at [facility]   PATIENT NAME: Adriana Woods    MR#:  [id number]  DATE OF BIRTH:  03-19-1956  SUBJECTIVE:  CHIEF COMPLAINT:   Chief Complaint  Patient presents with  . Migraine  . Numbness  The patient is 60 year old Caucasian female with past medical history significant for history of Arthritis, diverticulitis, hypertension, sarcoidosis, who presents to the hospital with complaints of headache, dizziness, tingling sensation of left face and temporal region. chest pain, abdominal pain. On arrival to the hospital, she was found to have negative troponins 4, normal sedimentation rate, normal thyroid studies, normal seem bad and CBC, LDH of 203. EKG revealed normal sinus rhythm at 85 bpm, nonspecific inferior lateral ST-T changes. Patient still complains of some tingling sensation of left facial area, headache, surrounding all cranium. Review of Systems  Constitutional: Negative for chills, fever and weight loss.  HENT: Negative for congestion.   Eyes: Negative for blurred vision and double vision.  Respiratory: Negative for cough, sputum production, shortness of breath and wheezing.   Cardiovascular: Negative for chest pain, palpitations, orthopnea, leg swelling and PND.  Gastrointestinal: Negative for abdominal pain, blood in stool, constipation, diarrhea, nausea and vomiting.  Genitourinary: Negative for dysuria, frequency, hematuria and urgency.  Musculoskeletal: Negative for falls.  Neurological: Positive for sensory change and headaches. Negative for dizziness, tremors and focal weakness.  Endo/Heme/Allergies: Does not bruise/bleed easily.  Psychiatric/Behavioral: Negative for depression. The patient does not have insomnia.     VITAL SIGNS: Blood pressure 140/84, pulse 64, temperature 98.2 F (36.8 C), temperature source Oral, resp. rate 16, height 5\' 5"  (1.651 m), weight 86.2 kg (190 lb), SpO2 98 %.  PHYSICAL EXAMINATION:    GENERAL:  60 y.o.-year-old patient lying in the bed with no acute distress.  EYES: Pupils equal, round, reactive to light and accommodation. No scleral icterus. Extraocular muscles intact.  HEENT: Head atraumatic, normocephalic. Oropharynx and nasopharynx clear.  NECK:  Supple, no jugular venous distention. No thyroid enlargement, no tenderness.  LUNGS: Normal breath sounds bilaterally, no wheezing, rales,rhonchi or crepitation. No use of accessory muscles of respiration.  CARDIOVASCULAR: S1, S2 normal. No murmurs, rubs, or gallops.  ABDOMEN: Soft, mild discomfort in left lower quadrant, no rebound or guarding noted, nondistended. Bowel sounds present. No organomegaly or mass.  EXTREMITIES: No pedal edema, cyanosis, or clubbing.  NEUROLOGIC: Cranial nerves II through XII are intact. Muscle strength 5/5 in all extremities. Sensation intact. Gait not checked.  PSYCHIATRIC: The patient is alert and oriented x 3.  SKIN: No obvious rash, lesion, or ulcer.   ORDERS/RESULTS REVIEWED:   CBC  Recent Labs Lab 11/01/15 1612  WBC 5.5  HGB 13.4  HCT 39.7  PLT 241  MCV 87.1  MCH 29.5  MCHC 33.8  RDW 13.8  LYMPHSABS 1.8  MONOABS 0.8  EOSABS 0.2  BASOSABS 0.1   ------------------------------------------------------------------------------------------------------------------  Chemistries   Recent Labs Lab 11/01/15 1612  NA 138  K 3.8  CL 103  CO2 27  GLUCOSE 88  BUN 15  CREATININE 0.66  CALCIUM 9.5  AST 21  ALT 19  ALKPHOS 78  BILITOT 0.6   ------------------------------------------------------------------------------------------------------------------ estimated creatinine clearance is 83.1 mL/min (by C-G formula based on SCr of 0.8 mg/dL). ------------------------------------------------------------------------------------------------------------------  Recent Labs  11/01/15 1849  TSH 3.430  T4TOTAL 7.0    Cardiac Enzymes  Recent Labs Lab 11/01/15 1849  11/02/15 0045 11/02/15 0701  TROPONINI <0.03 <0.03 <0.03   ------------------------------------------------------------------------------------------------------------------ Invalid input(s): POCBNP ---------------------------------------------------------------------------------------------------------------  RADIOLOGY:  Ct Abdomen Pelvis Wo Contrast  Result Date: 11/01/2015 CLINICAL DATA:  History of hypertension, sarcoidosis. Started experiencing headache, dizziness, left-sided facial tingling, photophobia. Left lower quadrant abdominal pain similar to diverticulitis in the past. Symptoms started at 11:30 a.m. EXAM: CT ABDOMEN AND PELVIS WITHOUT CONTRAST TECHNIQUE: Multidetector CT imaging of the abdomen and pelvis was performed following the standard protocol without IV contrast. COMPARISON:  None. FINDINGS: Lower chest: Small calcified granuloma is identified in the right middle lobe. There is mild bibasilar atelectasis or fibrosis at the lung bases. Hepatobiliary: Small calcified granulomata are identified within the liver. No suspicious liver lesions are present. Gallbladder is surgically absent. Pancreas: Within normal noncontrast appearance. Spleen: Small calcified splenic granulomata.  Otherwise, normal. Renal/Adrenal: Adrenal glands are normal in appearance. No intrarenal calculi. No hydronephrosis. Gastrointestinal tract: The stomach and small bowel loops are normal in appearance. Numerous colonic diverticula are present. No acute diverticulitis. Status post appendectomy. Reproductive/Pelvis: Status post hysterectomy. No adnexal mass. No free pelvic fluid. Vascular/Lymphatic: There is atherosclerotic calcification of the abdominal aorta. No aneurysm. Small, nonspecific lymph nodes are identified in the upper abdomen, largest identified in the gastrohepatic ligament measuring 1.5 x 0.8 cm. Musculoskeletal/Abdominal wall: Abdominal wall is unremarkable. Visualized osseous structures have a normal  appearance. Other: none IMPRESSION: 1.  No evidence for acute  abnormality. 2. Calcified granulomata within the liver, spleen. 3. Fibrotic changes at the lung bases. 4. Nonspecific small upper abdominal lymph nodes. 5. Colonic diverticulosis without evidence for acute diverticulitis. 6. Status post appendectomy, hysterectomy, cholecystectomy. Electronically Signed   By: Norva Pavlov M.D.   On: 11/01/2015 19:31   Dg Chest 2 View  Result Date: 11/01/2015 CLINICAL DATA:  Central chest pain. Weakness and shortness of breath. Numbness in the face and neck. EXAM: CHEST  2 VIEW COMPARISON:  None. FINDINGS: Cardiomediastinal silhouette is normal. Mediastinal contours appear intact. There is prominence of the bilateral pulmonary arteries. There is no evidence of focal airspace consolidation, pleural effusion or pneumothorax. Osseous structures are without acute abnormality. Soft tissues are grossly normal. IMPRESSION: Prominence of the bilateral pulmonary arteries which may be seen with pulmonary arterial hypertension. No evidence of cardiomegaly or pulmonary edema. Electronically Signed   By: Ted Mcalpine M.D.   On: 11/01/2015 19:36   Mr Angiogram Neck W Contrast  Result Date: 11/02/2015 CLINICAL DATA:  New onset headache and tingling in the left side of the face. A photophobia. Heaviness in the left lower extremity. Chest pressure. EXAM: MRI HEAD WITHOUT  CONTRAST MRA HEAD WITHOUT CONTRAST MRA NECK WITHOUT AND WITH CONTRAST TECHNIQUE: Multiplanar, multiecho pulse sequences of the brain and surrounding structures were obtained without and with intravenous contrast. Angiographic images of the Circle of Willis were obtained using MRA technique without intravenous contrast. Angiographic images of the neck were obtained using MRA technique without and with intravenous contrast. Carotid stenosis measurements (when applicable) are obtained utilizing NASCET criteria, using the distal internal carotid diameter as  the denominator. CONTRAST:  18mL MULTIHANCE GADOBENATE DIMEGLUMINE 529 MG/ML IV SOLN COMPARISON:  CT head without contrast 11/01/2015 FINDINGS: MRI HEAD FINDINGS No acute infarct, hemorrhage, or mass lesion is present. The ventricles are of normal size. Mild periventricular and scattered subcortical T2 hyperintensities bilaterally are mildly advanced for age. No significant extraaxial fluid collection is present. The internal auditory canals are within normal limits bilaterally. Flow is present in the major intracranial arteries. The globes and orbits are intact. A polyp or mucous retention cyst is noted posteriorly in the right sphenoid sinus.  The paranasal sinuses and the mastoid air cells are otherwise clear. The skullbase is within normal limits. Midline sagittal images are unremarkable. MRA HEAD FINDINGS The internal carotid arteries are within normal limits from the high cervical segments through the ICA termini bilaterally. The A1 and M1 segments are normal. Anterior communicating artery is patent. ACA MCA branch vessels are within normal limits. The right vertebral artery is slightly dominant to the left. PICA origins are visualized and normal. The vertebrobasilar junction is normal. The basilar artery is within normal limits. Both posterior cerebral arteries originate from the basilar tip. PCA branch vessels are within normal limits. MRA NECK FINDINGS The time-of-flight images demonstrate no significant flow disturbance. Flow is antegrade in the vertebral arteries bilaterally. Postcontrast images demonstrate a 3 vessel arch configuration. The right common carotid artery is within normal limits. The bifurcation is unremarkable. Cervical right ICA is normal. The left common carotid artery is within normal limits. The bifurcation is within normal limits. The cervical left ICA is normal. Signal loss at the origins of the vertebral arteries bilaterally is likely artifactual. Both vertebral arteries originate  from the subclavian arteries. No focal stenosis is present in either vessel. IMPRESSION: 1. No acute intracranial abnormality. 2. Mild periventricular and subcortical white matter changes bilaterally are slightly advanced for age. The finding is nonspecific but can be seen in the setting of chronic microvascular ischemia, a demyelinating process such as multiple sclerosis, vasculitis, complicated migraine headaches, or as the sequelae of a prior infectious or inflammatory process. 3. Normal variant MRA circle of Willis without significant proximal stenosis, aneurysm, or branch vessel occlusion. 4. Negative MRA of the neck without and with contrast. Electronically Signed   By: Marin Roberts M.D.   On: 11/02/2015 10:52   Mr Brain Wo Contrast  Result Date: 11/02/2015 CLINICAL DATA:  New onset headache and tingling in the left side of the face. A photophobia. Heaviness in the left lower extremity. Chest pressure. EXAM: MRI HEAD WITHOUT  CONTRAST MRA HEAD WITHOUT CONTRAST MRA NECK WITHOUT AND WITH CONTRAST TECHNIQUE: Multiplanar, multiecho pulse sequences of the brain and surrounding structures were obtained without and with intravenous contrast. Angiographic images of the Circle of Willis were obtained using MRA technique without intravenous contrast. Angiographic images of the neck were obtained using MRA technique without and with intravenous contrast. Carotid stenosis measurements (when applicable) are obtained utilizing NASCET criteria, using the distal internal carotid diameter as the denominator. CONTRAST:  18mL MULTIHANCE GADOBENATE DIMEGLUMINE 529 MG/ML IV SOLN COMPARISON:  CT head without contrast 11/01/2015 FINDINGS: MRI HEAD FINDINGS No acute infarct, hemorrhage, or mass lesion is present. The ventricles are of normal size. Mild periventricular and scattered subcortical T2 hyperintensities bilaterally are mildly advanced for age. No significant extraaxial fluid collection is present. The internal  auditory canals are within normal limits bilaterally. Flow is present in the major intracranial arteries. The globes and orbits are intact. A polyp or mucous retention cyst is noted posteriorly in the right sphenoid sinus. The paranasal sinuses and the mastoid air cells are otherwise clear. The skullbase is within normal limits. Midline sagittal images are unremarkable. MRA HEAD FINDINGS The internal carotid arteries are within normal limits from the high cervical segments through the ICA termini bilaterally. The A1 and M1 segments are normal. Anterior communicating artery is patent. ACA MCA branch vessels are within normal limits. The right vertebral artery is slightly dominant to the left. PICA origins are visualized and normal. The vertebrobasilar junction is normal. The basilar artery is  within normal limits. Both posterior cerebral arteries originate from the basilar tip. PCA branch vessels are within normal limits. MRA NECK FINDINGS The time-of-flight images demonstrate no significant flow disturbance. Flow is antegrade in the vertebral arteries bilaterally. Postcontrast images demonstrate a 3 vessel arch configuration. The right common carotid artery is within normal limits. The bifurcation is unremarkable. Cervical right ICA is normal. The left common carotid artery is within normal limits. The bifurcation is within normal limits. The cervical left ICA is normal. Signal loss at the origins of the vertebral arteries bilaterally is likely artifactual. Both vertebral arteries originate from the subclavian arteries. No focal stenosis is present in either vessel. IMPRESSION: 1. No acute intracranial abnormality. 2. Mild periventricular and subcortical white matter changes bilaterally are slightly advanced for age. The finding is nonspecific but can be seen in the setting of chronic microvascular ischemia, a demyelinating process such as multiple sclerosis, vasculitis, complicated migraine headaches, or as the  sequelae of a prior infectious or inflammatory process. 3. Normal variant MRA circle of Willis without significant proximal stenosis, aneurysm, or branch vessel occlusion. 4. Negative MRA of the neck without and with contrast. Electronically Signed   By: Marin Roberts M.D.   On: 11/02/2015 10:52   Mr Maxine Glenn Head/brain JX Cm  Result Date: 11/02/2015 CLINICAL DATA:  New onset headache and tingling in the left side of the face. A photophobia. Heaviness in the left lower extremity. Chest pressure. EXAM: MRI HEAD WITHOUT  CONTRAST MRA HEAD WITHOUT CONTRAST MRA NECK WITHOUT AND WITH CONTRAST TECHNIQUE: Multiplanar, multiecho pulse sequences of the brain and surrounding structures were obtained without and with intravenous contrast. Angiographic images of the Circle of Willis were obtained using MRA technique without intravenous contrast. Angiographic images of the neck were obtained using MRA technique without and with intravenous contrast. Carotid stenosis measurements (when applicable) are obtained utilizing NASCET criteria, using the distal internal carotid diameter as the denominator. CONTRAST:  18mL MULTIHANCE GADOBENATE DIMEGLUMINE 529 MG/ML IV SOLN COMPARISON:  CT head without contrast 11/01/2015 FINDINGS: MRI HEAD FINDINGS No acute infarct, hemorrhage, or mass lesion is present. The ventricles are of normal size. Mild periventricular and scattered subcortical T2 hyperintensities bilaterally are mildly advanced for age. No significant extraaxial fluid collection is present. The internal auditory canals are within normal limits bilaterally. Flow is present in the major intracranial arteries. The globes and orbits are intact. A polyp or mucous retention cyst is noted posteriorly in the right sphenoid sinus. The paranasal sinuses and the mastoid air cells are otherwise clear. The skullbase is within normal limits. Midline sagittal images are unremarkable. MRA HEAD FINDINGS The internal carotid arteries are  within normal limits from the high cervical segments through the ICA termini bilaterally. The A1 and M1 segments are normal. Anterior communicating artery is patent. ACA MCA branch vessels are within normal limits. The right vertebral artery is slightly dominant to the left. PICA origins are visualized and normal. The vertebrobasilar junction is normal. The basilar artery is within normal limits. Both posterior cerebral arteries originate from the basilar tip. PCA branch vessels are within normal limits. MRA NECK FINDINGS The time-of-flight images demonstrate no significant flow disturbance. Flow is antegrade in the vertebral arteries bilaterally. Postcontrast images demonstrate a 3 vessel arch configuration. The right common carotid artery is within normal limits. The bifurcation is unremarkable. Cervical right ICA is normal. The left common carotid artery is within normal limits. The bifurcation is within normal limits. The cervical left ICA is normal. Signal loss  at the origins of the vertebral arteries bilaterally is likely artifactual. Both vertebral arteries originate from the subclavian arteries. No focal stenosis is present in either vessel. IMPRESSION: 1. No acute intracranial abnormality. 2. Mild periventricular and subcortical white matter changes bilaterally are slightly advanced for age. The finding is nonspecific but can be seen in the setting of chronic microvascular ischemia, a demyelinating process such as multiple sclerosis, vasculitis, complicated migraine headaches, or as the sequelae of a prior infectious or inflammatory process. 3. Normal variant MRA circle of Willis without significant proximal stenosis, aneurysm, or branch vessel occlusion. 4. Negative MRA of the neck without and with contrast. Electronically Signed   By: Marin Roberts M.D.   On: 11/02/2015 10:52   Ct Head Code Stroke W/o Cm  Result Date: 11/01/2015 CLINICAL DATA:  Code stroke. 60 year old female with headache,  dizziness and left face tingling beginning at 1130 hours today. Initial encounter. EXAM: CT HEAD WITHOUT CONTRAST TECHNIQUE: Contiguous axial images were obtained from the base of the skull through the vertex without intravenous contrast. COMPARISON:  None. FINDINGS: Mild right sphenoid sinus mucosal thickening. Other visible sinuses and mastoid air cells are clear. No acute osseous abnormality identified. Negative orbit and scalp soft tissues. Calcified atherosclerosis at the skull base. Cerebral volume is within normal limits for age. No midline shift, ventriculomegaly, mass effect, evidence of mass lesion, intracranial hemorrhage or evidence of cortically based acute infarction. Gray-white matter differentiation is within normal limits throughout the brain. No suspicious intracranial vascular hyperdensity. ASPECTS Total score (0-10 with 10 being normal): 10 IMPRESSION: 1. Normal for age noncontrast CT appearance of the brain. 2. ASPECTS score 10 3. Study discussed by telephone with Dr. Lurena Joiner LORD on 11/01/2015 at 16:26 . Electronically Signed   By: Odessa Fleming M.D.   On: 11/01/2015 16:28    EKG:  Orders placed or performed during the hospital encounter of 11/01/15  . ED EKG  . ED EKG  . EKG 12-Lead  . EKG 12-Lead  . EKG 12-Lead  . EKG 12-Lead    ASSESSMENT AND PLAN:  Principal Problem:   TIA (transient ischemic attack) Active Problems:   Headache   Tingling sensation   Dizziness   Drug abuse   Atypical chest pain   LLQ abdominal pain #1. Headache with tingling Sensation of the left face, concerning for complicated migraine versus tension headache, rule out TIA, patient had MRI of the brain, which revealed no acute intracranial abnormality, possible microvascular ischemia was noted, although demyelinating processes such as MS was also concerned, MRA was unremarkable., neurology consultation is pending, continue aspirin therapy, add Norflex, follow clinically, possible discharge home today #2,  dizziness, initiate patient on a cousin #3. Drug abuse, urine drug screen was positive for opiates and tricyclics, unclear if this study was done after opiates were given in emergency room, however, patient is not on tricyclics at home, may benefit from a psychologist evaluation as outpatient #4. Chest pain , no cardiac injury. According to troponins, echocardiogram is pending, outpatient nuclear stress test was recommended by cardiologist, appreciate cardiologist input, patient is to follow up with cardiologist outpatient #5. Left lower quadrant abdominal pain, could be muscular, CT scan of abdomen showed no abnormalities, urinalysis is unremarkable, supportive therapy was recommended   Management plans discussed with the patient, family and they are in agreement.   DRUG ALLERGIES:  Allergies  Allergen Reactions  . Sulfa Antibiotics Itching and Swelling    CODE STATUS:     Code Status Orders  Start     Ordered   11/01/15 1843  Full code  Continuous     11/01/15 1842    Code Status History    Date Active Date Inactive Code Status Order ID Comments User Context   11/01/2015  6:42 PM 11/02/2015  7:50 AM Full Code [id number]  Tonye RoyaltyAlexis Hugelmeyer, DO Inpatient      TOTAL TIME TAKING CARE OF THIS PATIENT: 40 minutes.    Katharina CaperVAICKUTE,Chaniya Genter M.D on 11/02/2015 at 11:18 AM  Between 7am to 6pm - Pager - [phone]  After 6pm go to [email or web address] - password [email or web address]  Office  [phone]  CC: Primary care physician; No PCP Per Patient

## 2015-11-02 NOTE — Consult Note (Signed)
Referring Physician: Winona Legato    Chief Complaint: Headache, left sided numbness  HPI: Adriana Woods is an 59 y.o. female with a history of headaches who reports onset of headache associated with dizziness and tingling on the left side of her face in the temporal region. Headache started yesterday.  Symptoms were also associated with photophobia, but no nausea or vomiting.    She also reports that as the day progressed her left left felt "heavy" and weak.  Today this weakness has improved but continues to have left facial tingling and headache.  Headache is rated at a 6-7/10.  Described as being frontal, occipital and radiating down the neck.  Headaches are usually just frontal and not associated with focal symptoms.  Uses OTC sinus medications usually.    Date last known well: 11/01/2015 Time last known well: Time: 11:30 tPA Given: No: Not felt to be a stroke  Past Medical History:  Diagnosis Date  . Arthritis   . Diverticulitis   . Heart murmur   . Hypertension   . Sarcoidosis [facility])     Past Surgical History:  Procedure Laterality Date  . ABDOMINAL EXPLORATION SURGERY    . ABDOMINAL HYSTERECTOMY    . APPENDECTOMY    . CHOLECYSTECTOMY    . KNEE SURGERY      Family History  Problem Relation Age of Onset  . Hypertension Mother    Social History:  reports that she has never smoked. She has never used smokeless tobacco. She reports that she does not drink alcohol. Her drug history is not on file.  Allergies:  Allergies  Allergen Reactions  . Sulfa Antibiotics Itching and Swelling    Medications:  I have reviewed the patient's current medications. Prior to Admission:  Prescriptions Prior to Admission  Medication Sig Dispense Refill Last Dose  . aspirin EC 81 MG tablet Take 81 mg by mouth daily.   11/01/2015 at 1100  . losartan (COZAAR) 100 MG tablet Take 100 mg by mouth daily.   11/01/2015 at am   Scheduled: . aspirin EC  81 mg Oral Daily  . enoxaparin (LOVENOX)  injection  40 mg Subcutaneous Q24H  . ketorolac  30 mg Intravenous Once  . losartan  100 mg Oral Daily  . orphenadrine  100 mg Oral BID    ROS: History obtained from the patient  General ROS: negative for - chills, fatigue, fever, night sweats, weight gain or weight loss Psychological ROS: negative for - behavioral disorder, hallucinations, memory difficulties, mood swings or suicidal ideation Ophthalmic ROS: negative for - blurry vision, double vision, eye pain or loss of vision ENT ROS: negative for - epistaxis, nasal discharge, oral lesions, sore throat, tinnitus or vertigo Allergy and Immunology ROS: negative for - hives or itchy/watery eyes Hematological and Lymphatic ROS: negative for - bleeding problems, bruising or swollen lymph nodes Endocrine ROS: negative for - galactorrhea, hair pattern changes, polydipsia/polyuria or temperature intolerance Respiratory ROS: negative for - cough, hemoptysis, shortness of breath or wheezing Cardiovascular ROS: negative for - chest pain, dyspnea on exertion, edema or irregular heartbeat Gastrointestinal ROS: negative for - abdominal pain, diarrhea, hematemesis, nausea/vomiting or stool incontinence Genito-Urinary ROS: negative for - dysuria, hematuria, incontinence or urinary frequency/urgency Musculoskeletal ROS: negative for - joint swelling or muscular weakness Neurological ROS: as noted in HPI Dermatological ROS: negative for rash and skin lesion changes   Physical Examination: Blood pressure (!) 110/52, pulse 74, temperature 98.4 F (36.9 C), temperature source Oral, resp. rate 18, height 5\' 5"  (  1.651 m), weight 86.2 kg (190 lb), SpO2 99 %.  HEENT-  Normocephalic, no lesions, without obvious abnormality.  Normal external eye and conjunctiva.  Normal TM's bilaterally.  Normal auditory canals and external ears. Normal external nose, mucus membranes and septum.  Normal pharynx. Cardiovascular- S1, S2 normal, pulses palpable throughout    Lungs- chest clear, no wheezing, rales, normal symmetric air entry Abdomen- soft, non-tender; bowel sounds normal; no masses,  no organomegaly Extremities- no edema Lymph-no adenopathy palpable Musculoskeletal-no joint tenderness, deformity or swelling Skin-warm and dry, no hyperpigmentation, vitiligo, or suspicious lesions  Neurological Examination Mental Status: Alert, oriented, thought content appropriate.  Speech fluent without evidence of aphasia.  Able to follow 3 step commands without difficulty. Cranial Nerves: II: Discs flat bilaterally; Visual fields grossly normal, pupils equal, round, reactive to light and accommodation III,IV, VI: ptosis not present, extra-ocular motions intact bilaterally V,VII: smile symmetric, facial light touch sensation decreased on the lateral portion of the left eye VIII: hearing normal bilaterally IX,X: gag reflex present XI: bilateral shoulder shrug XII: midline tongue extension Motor: Right : Upper extremity   5/5    Left:     Upper extremity   5/5  Lower extremity   5/5     Lower extremity   5/5 Tone and bulk:normal tone throughout; no atrophy noted.  Some give-way weakness noted on the left Sensory: Pinprick and light touch intact throughout, bilaterally Deep Tendon Reflexes: 2+ and symmetric throughout Plantars: Right: downgoing   Left: downgoing Cerebellar: normal finger-to-nose and normal heel-to-shin testing bilaterally Gait: normal gait and station    Laboratory Studies:  Basic Metabolic Panel:  Recent Labs Lab 11/01/15 1612  NA 138  K 3.8  CL 103  CO2 27  GLUCOSE 88  BUN 15  CREATININE 0.66  CALCIUM 9.5    Liver Function Tests:  Recent Labs Lab 11/01/15 1612  AST 21  ALT 19  ALKPHOS 78  BILITOT 0.6  PROT 7.8  ALBUMIN 4.5   No results for input(s): LIPASE, AMYLASE in the last 168 hours. No results for input(s): AMMONIA in the last 168 hours.  CBC:  Recent Labs Lab 11/01/15 1612  WBC 5.5  NEUTROABS 2.7   HGB 13.4  HCT 39.7  MCV 87.1  PLT 241    Cardiac Enzymes:  Recent Labs Lab 11/01/15 1612 11/01/15 1849 11/02/15 0045 11/02/15 0701  TROPONINI <0.03 <0.03 <0.03 <0.03    BNP: Invalid input(s): POCBNP  CBG:  Recent Labs Lab 11/01/15 1613  GLUCAP 71    Microbiology: No results found for this or any previous visit.  Coagulation Studies:  Recent Labs  11/01/15 1612  LABPROT 12.9  INR 0.97    Urinalysis:  Recent Labs Lab 11/02/15 0637  COLORURINE STRAW*  LABSPEC 1.008  PHURINE 5.0  GLUCOSEU NEGATIVE  HGBUR 1+*  BILIRUBINUR NEGATIVE  KETONESUR NEGATIVE  PROTEINUR NEGATIVE  NITRITE NEGATIVE  LEUKOCYTESUR 3+*    Lipid Panel:    Component Value Date/Time   CHOL 292 (H) 11/02/2015 0701   TRIG 278 (H) 11/02/2015 0701   HDL 33 (L) 11/02/2015 0701   CHOLHDL 8.8 11/02/2015 0701   VLDL 56 (H) 11/02/2015 0701   LDLCALC 203 (H) 11/02/2015 0701    HgbA1C: No results found for: HGBA1C  Urine Drug Screen:     Component Value Date/Time   LABOPIA POSITIVE (A) 11/01/2015 2135   COCAINSCRNUR NONE DETECTED 11/01/2015 2135   LABBENZ NONE DETECTED 11/01/2015 2135   AMPHETMU NONE DETECTED 11/01/2015 2135  THCU NONE DETECTED 11/01/2015 2135   LABBARB NONE DETECTED 11/01/2015 2135    Alcohol Level: No results for input(s): ETH in the last 168 hours.  Other results: EKG: normal sinus rhythm at 66 bpm.  Imaging: Ct Abdomen Pelvis Wo Contrast  Result Date: 11/01/2015 CLINICAL DATA:  History of hypertension, sarcoidosis. Started experiencing headache, dizziness, left-sided facial tingling, photophobia. Left lower quadrant abdominal pain similar to diverticulitis in the past. Symptoms started at 11:30 a.m. EXAM: CT ABDOMEN AND PELVIS WITHOUT CONTRAST TECHNIQUE: Multidetector CT imaging of the abdomen and pelvis was performed following the standard protocol without IV contrast. COMPARISON:  None. FINDINGS: Lower chest: Small calcified granuloma is identified in the  right middle lobe. There is mild bibasilar atelectasis or fibrosis at the lung bases. Hepatobiliary: Small calcified granulomata are identified within the liver. No suspicious liver lesions are present. Gallbladder is surgically absent. Pancreas: Within normal noncontrast appearance. Spleen: Small calcified splenic granulomata.  Otherwise, normal. Renal/Adrenal: Adrenal glands are normal in appearance. No intrarenal calculi. No hydronephrosis. Gastrointestinal tract: The stomach and small bowel loops are normal in appearance. Numerous colonic diverticula are present. No acute diverticulitis. Status post appendectomy. Reproductive/Pelvis: Status post hysterectomy. No adnexal mass. No free pelvic fluid. Vascular/Lymphatic: There is atherosclerotic calcification of the abdominal aorta. No aneurysm. Small, nonspecific lymph nodes are identified in the upper abdomen, largest identified in the gastrohepatic ligament measuring 1.5 x 0.8 cm. Musculoskeletal/Abdominal wall: Abdominal wall is unremarkable. Visualized osseous structures have a normal appearance. Other: none IMPRESSION: 1.  No evidence for acute  abnormality. 2. Calcified granulomata within the liver, spleen. 3. Fibrotic changes at the lung bases. 4. Nonspecific small upper abdominal lymph nodes. 5. Colonic diverticulosis without evidence for acute diverticulitis. 6. Status post appendectomy, hysterectomy, cholecystectomy. Electronically Signed   By: Norva Pavlov M.D.   On: 11/01/2015 19:31   Dg Chest 2 View  Result Date: 11/01/2015 CLINICAL DATA:  Central chest pain. Weakness and shortness of breath. Numbness in the face and neck. EXAM: CHEST  2 VIEW COMPARISON:  None. FINDINGS: Cardiomediastinal silhouette is normal. Mediastinal contours appear intact. There is prominence of the bilateral pulmonary arteries. There is no evidence of focal airspace consolidation, pleural effusion or pneumothorax. Osseous structures are without acute abnormality. Soft  tissues are grossly normal. IMPRESSION: Prominence of the bilateral pulmonary arteries which may be seen with pulmonary arterial hypertension. No evidence of cardiomegaly or pulmonary edema. Electronically Signed   By: Ted Mcalpine M.D.   On: 11/01/2015 19:36   Mr Angiogram Neck W Contrast  Result Date: 11/02/2015 CLINICAL DATA:  New onset headache and tingling in the left side of the face. A photophobia. Heaviness in the left lower extremity. Chest pressure. EXAM: MRI HEAD WITHOUT  CONTRAST MRA HEAD WITHOUT CONTRAST MRA NECK WITHOUT AND WITH CONTRAST TECHNIQUE: Multiplanar, multiecho pulse sequences of the brain and surrounding structures were obtained without and with intravenous contrast. Angiographic images of the Circle of Willis were obtained using MRA technique without intravenous contrast. Angiographic images of the neck were obtained using MRA technique without and with intravenous contrast. Carotid stenosis measurements (when applicable) are obtained utilizing NASCET criteria, using the distal internal carotid diameter as the denominator. CONTRAST:  18mL MULTIHANCE GADOBENATE DIMEGLUMINE 529 MG/ML IV SOLN COMPARISON:  CT head without contrast 11/01/2015 FINDINGS: MRI HEAD FINDINGS No acute infarct, hemorrhage, or mass lesion is present. The ventricles are of normal size. Mild periventricular and scattered subcortical T2 hyperintensities bilaterally are mildly advanced for age. No significant extraaxial fluid collection  is present. The internal auditory canals are within normal limits bilaterally. Flow is present in the major intracranial arteries. The globes and orbits are intact. A polyp or mucous retention cyst is noted posteriorly in the right sphenoid sinus. The paranasal sinuses and the mastoid air cells are otherwise clear. The skullbase is within normal limits. Midline sagittal images are unremarkable. MRA HEAD FINDINGS The internal carotid arteries are within normal limits from the high  cervical segments through the ICA termini bilaterally. The A1 and M1 segments are normal. Anterior communicating artery is patent. ACA MCA branch vessels are within normal limits. The right vertebral artery is slightly dominant to the left. PICA origins are visualized and normal. The vertebrobasilar junction is normal. The basilar artery is within normal limits. Both posterior cerebral arteries originate from the basilar tip. PCA branch vessels are within normal limits. MRA NECK FINDINGS The time-of-flight images demonstrate no significant flow disturbance. Flow is antegrade in the vertebral arteries bilaterally. Postcontrast images demonstrate a 3 vessel arch configuration. The right common carotid artery is within normal limits. The bifurcation is unremarkable. Cervical right ICA is normal. The left common carotid artery is within normal limits. The bifurcation is within normal limits. The cervical left ICA is normal. Signal loss at the origins of the vertebral arteries bilaterally is likely artifactual. Both vertebral arteries originate from the subclavian arteries. No focal stenosis is present in either vessel. IMPRESSION: 1. No acute intracranial abnormality. 2. Mild periventricular and subcortical white matter changes bilaterally are slightly advanced for age. The finding is nonspecific but can be seen in the setting of chronic microvascular ischemia, a demyelinating process such as multiple sclerosis, vasculitis, complicated migraine headaches, or as the sequelae of a prior infectious or inflammatory process. 3. Normal variant MRA circle of Willis without significant proximal stenosis, aneurysm, or branch vessel occlusion. 4. Negative MRA of the neck without and with contrast. Electronically Signed   By: Marin Roberts M.D.   On: 11/02/2015 10:52   Mr Brain Wo Contrast  Result Date: 11/02/2015 CLINICAL DATA:  New onset headache and tingling in the left side of the face. A photophobia. Heaviness in  the left lower extremity. Chest pressure. EXAM: MRI HEAD WITHOUT  CONTRAST MRA HEAD WITHOUT CONTRAST MRA NECK WITHOUT AND WITH CONTRAST TECHNIQUE: Multiplanar, multiecho pulse sequences of the brain and surrounding structures were obtained without and with intravenous contrast. Angiographic images of the Circle of Willis were obtained using MRA technique without intravenous contrast. Angiographic images of the neck were obtained using MRA technique without and with intravenous contrast. Carotid stenosis measurements (when applicable) are obtained utilizing NASCET criteria, using the distal internal carotid diameter as the denominator. CONTRAST:  18mL MULTIHANCE GADOBENATE DIMEGLUMINE 529 MG/ML IV SOLN COMPARISON:  CT head without contrast 11/01/2015 FINDINGS: MRI HEAD FINDINGS No acute infarct, hemorrhage, or mass lesion is present. The ventricles are of normal size. Mild periventricular and scattered subcortical T2 hyperintensities bilaterally are mildly advanced for age. No significant extraaxial fluid collection is present. The internal auditory canals are within normal limits bilaterally. Flow is present in the major intracranial arteries. The globes and orbits are intact. A polyp or mucous retention cyst is noted posteriorly in the right sphenoid sinus. The paranasal sinuses and the mastoid air cells are otherwise clear. The skullbase is within normal limits. Midline sagittal images are unremarkable. MRA HEAD FINDINGS The internal carotid arteries are within normal limits from the high cervical segments through the ICA termini bilaterally. The A1 and M1 segments are  normal. Anterior communicating artery is patent. ACA MCA branch vessels are within normal limits. The right vertebral artery is slightly dominant to the left. PICA origins are visualized and normal. The vertebrobasilar junction is normal. The basilar artery is within normal limits. Both posterior cerebral arteries originate from the basilar tip. PCA  branch vessels are within normal limits. MRA NECK FINDINGS The time-of-flight images demonstrate no significant flow disturbance. Flow is antegrade in the vertebral arteries bilaterally. Postcontrast images demonstrate a 3 vessel arch configuration. The right common carotid artery is within normal limits. The bifurcation is unremarkable. Cervical right ICA is normal. The left common carotid artery is within normal limits. The bifurcation is within normal limits. The cervical left ICA is normal. Signal loss at the origins of the vertebral arteries bilaterally is likely artifactual. Both vertebral arteries originate from the subclavian arteries. No focal stenosis is present in either vessel. IMPRESSION: 1. No acute intracranial abnormality. 2. Mild periventricular and subcortical white matter changes bilaterally are slightly advanced for age. The finding is nonspecific but can be seen in the setting of chronic microvascular ischemia, a demyelinating process such as multiple sclerosis, vasculitis, complicated migraine headaches, or as the sequelae of a prior infectious or inflammatory process. 3. Normal variant MRA circle of Willis without significant proximal stenosis, aneurysm, or branch vessel occlusion. 4. Negative MRA of the neck without and with contrast. Electronically Signed   By: Marin Robertshristopher  Mattern M.D.   On: 11/02/2015 10:52   Mr Maxine GlennMra Head/brain WUWo Cm  Result Date: 11/02/2015 CLINICAL DATA:  New onset headache and tingling in the left side of the face. A photophobia. Heaviness in the left lower extremity. Chest pressure. EXAM: MRI HEAD WITHOUT  CONTRAST MRA HEAD WITHOUT CONTRAST MRA NECK WITHOUT AND WITH CONTRAST TECHNIQUE: Multiplanar, multiecho pulse sequences of the brain and surrounding structures were obtained without and with intravenous contrast. Angiographic images of the Circle of Willis were obtained using MRA technique without intravenous contrast. Angiographic images of the neck were obtained  using MRA technique without and with intravenous contrast. Carotid stenosis measurements (when applicable) are obtained utilizing NASCET criteria, using the distal internal carotid diameter as the denominator. CONTRAST:  18mL MULTIHANCE GADOBENATE DIMEGLUMINE 529 MG/ML IV SOLN COMPARISON:  CT head without contrast 11/01/2015 FINDINGS: MRI HEAD FINDINGS No acute infarct, hemorrhage, or mass lesion is present. The ventricles are of normal size. Mild periventricular and scattered subcortical T2 hyperintensities bilaterally are mildly advanced for age. No significant extraaxial fluid collection is present. The internal auditory canals are within normal limits bilaterally. Flow is present in the major intracranial arteries. The globes and orbits are intact. A polyp or mucous retention cyst is noted posteriorly in the right sphenoid sinus. The paranasal sinuses and the mastoid air cells are otherwise clear. The skullbase is within normal limits. Midline sagittal images are unremarkable. MRA HEAD FINDINGS The internal carotid arteries are within normal limits from the high cervical segments through the ICA termini bilaterally. The A1 and M1 segments are normal. Anterior communicating artery is patent. ACA MCA branch vessels are within normal limits. The right vertebral artery is slightly dominant to the left. PICA origins are visualized and normal. The vertebrobasilar junction is normal. The basilar artery is within normal limits. Both posterior cerebral arteries originate from the basilar tip. PCA branch vessels are within normal limits. MRA NECK FINDINGS The time-of-flight images demonstrate no significant flow disturbance. Flow is antegrade in the vertebral arteries bilaterally. Postcontrast images demonstrate a 3 vessel arch configuration. The right  common carotid artery is within normal limits. The bifurcation is unremarkable. Cervical right ICA is normal. The left common carotid artery is within normal limits. The  bifurcation is within normal limits. The cervical left ICA is normal. Signal loss at the origins of the vertebral arteries bilaterally is likely artifactual. Both vertebral arteries originate from the subclavian arteries. No focal stenosis is present in either vessel. IMPRESSION: 1. No acute intracranial abnormality. 2. Mild periventricular and subcortical white matter changes bilaterally are slightly advanced for age. The finding is nonspecific but can be seen in the setting of chronic microvascular ischemia, a demyelinating process such as multiple sclerosis, vasculitis, complicated migraine headaches, or as the sequelae of a prior infectious or inflammatory process. 3. Normal variant MRA circle of Willis without significant proximal stenosis, aneurysm, or branch vessel occlusion. 4. Negative MRA of the neck without and with contrast. Electronically Signed   By: Marin Roberts M.D.   On: 11/02/2015 10:52   Ct Head Code Stroke W/o Cm  Result Date: 11/01/2015 CLINICAL DATA:  Code stroke. 60 year old female with headache, dizziness and left face tingling beginning at 1130 hours today. Initial encounter. EXAM: CT HEAD WITHOUT CONTRAST TECHNIQUE: Contiguous axial images were obtained from the base of the skull through the vertex without intravenous contrast. COMPARISON:  None. FINDINGS: Mild right sphenoid sinus mucosal thickening. Other visible sinuses and mastoid air cells are clear. No acute osseous abnormality identified. Negative orbit and scalp soft tissues. Calcified atherosclerosis at the skull base. Cerebral volume is within normal limits for age. No midline shift, ventriculomegaly, mass effect, evidence of mass lesion, intracranial hemorrhage or evidence of cortically based acute infarction. Gray-white matter differentiation is within normal limits throughout the brain. No suspicious intracranial vascular hyperdensity. ASPECTS Total score (0-10 with 10 being normal): 10 IMPRESSION: 1. Normal for age  noncontrast CT appearance of the brain. 2. ASPECTS score 10 3. Study discussed by telephone with Dr. Lurena Joiner LORD on 11/01/2015 at 16:26 . Electronically Signed   By: Odessa Fleming M.D.   On: 11/01/2015 16:28    Assessment: 60 y.o. female presenting with headache and focal left sided symptoms.  MRI of the brain personally reviewed and shows no acute changes.  Do not suspect acute infarct or TIA.  MRA shows no evidence of dissection.  Echocardiogram shows no cardiac source of emboli with an EF of 55-60%.  A1c pending, LDL 203.  Possible complicated migraine.  Patient reports minimal to no benefit from Oxy.  Has received some benefit from Norflex.  May also benefit from a nonsteroidal adjunct.    Stroke Risk Factors - hypertension  Plan: 1. Toradol 30mg  IV now.  Patient has been recently dosed with Norflex and decrease in pain severity from 9/10-6/10.   2.  Would discontinue use of Oxy since patient without benefit 3.  Continue ASA daily  Thana Farr, MD Neurology [phone] 11/02/2015, 2:16 PM

## 2015-11-02 NOTE — Evaluation (Signed)
Physical Therapy Evaluation Patient Details Name: Adriana Woods MRN: [id number] DOB: 18-Feb-1956 Today's Date: 11/02/2015   History of Present Illness  Pt is a 60 y.o. female with h/o HTN, sarcoidosis, migraine disorder, heart murmur, and chronic pain disorder who presented to [facility] with headache, neck tightness, dizziness, tingling L side of face, L sided heaviness and weakness, long standing chest pain, and abdominal pain.  Clinical Impression  Prior to admission, pt was independent with functional mobility (currently unemployed).  Pt lives with her husband in 1 level home with 5 STE L rail.  Currently pt is independent with bed mobility and transfers and SBA with ambulation 110 feet no AD (pt with slower gait speed d/t HA but overall steady without loss of balance).  Pt scored 26/28 on Tinetti balance assessment indicating pt is at low risk for falls.  Pt unsteady/increased sway with static standing eyes closed activities; pt also demonstrates increased BOS with standing and ambulation.  Recommend pt discharge to home when medically appropriate; no further PT needs anticipated upon discharge home.     Follow Up Recommendations No PT follow up    Equipment Recommendations  None recommended by PT    Recommendations for Other Services       Precautions / Restrictions Precautions Precautions: Fall Restrictions Weight Bearing Restrictions: No      Mobility  Bed Mobility Overal bed mobility: Independent             General bed mobility comments: Supine to/from sit  Transfers Overall transfer level: Independent Equipment used: None             General transfer comment: steady without loss of balance  Ambulation/Gait Ambulation/Gait assistance: Supervision Ambulation Distance (Feet): 110 Feet Assistive device: None   Gait velocity: decreased (d/t HA)   General Gait Details: mildly increased BOS; decreased B step length; slower gait (pt reporting d/t significant HA  and lights in hallway making head hurt); no loss of balance noted  Stairs Stairs:  (Pt declined d/t HA)          Wheelchair Mobility    Modified Rankin (Stroke Patients Only)       Balance Overall balance assessment: Needs assistance Sitting-balance support: No upper extremity supported;Feet supported Sitting balance-Leahy Scale: Normal     Standing balance support: No upper extremity supported;During functional activity Standing balance-Leahy Scale: Good                   Standardized Balance Assessment Standardized Balance Assessment :  (Tinetti balance assessment (see note for details))   TINETTI BALANCE ASSESSMENT TOOL BALANCE SECTION Sitting Balance:     1/1  Rises from Chair:   2/2   Attempts to Rise:   2/2  Standing Balance (1st 5 seconds) 2/2  Standing balance:   2/2 Nudged:    2/2 Eyes Closed:    0/1 (loss of balance-pt grabbed onto therapist to steady/increased lateral sway) Turning 360 degrees   1/1      1/1 Sitting down (getting seated):    2/2  Balance Score:    15/16  GAIT SECTION Indication of Gait:   1/1 Step Length and Height  2/2 Foot Clearance:   2/2 Step Symmetry:   1/1 Step Continuity:   1/1 Path:     2/2 Trunk:     2/2 Walking Time:    0/1 (increased BOS) Gait Score:    11/12  Combined Score:   26/28         Pertinent  Vitals/Pain Pain Assessment: 0-10 Pain Score: 8  Pain Location: Headache Pain Descriptors / Indicators: Headache Pain Intervention(s): Limited activity within patient's tolerance;Monitored during session (RN aware with plans to give pain meds)  Vitals stable and WFL throughout treatment session.    Home Living Family/patient expects to be discharged to:: Private residence Living Arrangements: Spouse/significant other Available Help at Discharge: Family Type of Home: House Home Access: Stairs to enter Entrance Stairs-Rails: Left Entrance Stairs-Number of Steps: 5 Home Layout: One level Home Equipment:  None      Prior Function Level of Independence: Independent         Comments: Pt reports not working currently.     Hand Dominance        Extremity/Trunk Assessment   Upper Extremity Assessment: Overall WFL for tasks assessed (Full assessment deferred to OT.)           Lower Extremity Assessment:  (B hip flexion 4+/5; B knee flexion/extension 5/5; B DF 4+/5.  Intact B LE proprioception, light touch, and heel to shin coordination.)         Communication   Communication: No difficulties  Cognition Arousal/Alertness: Awake/alert Behavior During Therapy: WFL for tasks assessed/performed Overall Cognitive Status: Within Functional Limits for tasks assessed                      General Comments General comments (skin integrity, edema, etc.): Pt just returned from bathroom with NA present.  Nursing cleared pt for participation in physical therapy.  Pt agreeable to PT session (even before getting pain meds).    Exercises        Assessment/Plan    PT Assessment Patient needs continued PT services  PT Diagnosis Difficulty walking;Acute pain   PT Problem List Decreased activity tolerance;Decreased balance;Decreased mobility;Pain  PT Treatment Interventions Gait training;Stair training;Balance training;Patient/family education   PT Goals (Current goals can be found in the Care Plan section) Acute Rehab PT Goals Patient Stated Goal: to go home PT Goal Formulation: With patient Time For Goal Achievement: 11/16/15 Potential to Achieve Goals: Good    Frequency Min 2X/week   Barriers to discharge        Co-evaluation               End of Session Equipment Utilized During Treatment: Gait belt Activity Tolerance:  (Limited d/t HA) Patient left: in bed;with call bell/phone within reach;with bed alarm set;with nursing/sitter in room Nurse Communication: Mobility status;Precautions (Nursing aware of pt's HA)    Functional Assessment Tool Used: AM-PAC  without stairs Functional Limitation: Mobility: Walking and moving around Mobility: Walking and Moving Around Current Status ([id number]): At least 20 percent but less than 40 percent impaired, limited or restricted Mobility: Walking and Moving Around Goal Status [phone](G8979): 0 percent impaired, limited or restricted    Time: 1100-1118 PT Time Calculation (min) (ACUTE ONLY): 18 min   Charges:   PT Evaluation $PT Eval Low Complexity: 1 Procedure     PT G Codes:   PT G-Codes **NOT FOR INPATIENT CLASS** Functional Assessment Tool Used: AM-PAC without stairs Functional Limitation: Mobility: Walking and moving around Mobility: Walking and Moving Around Current Status ([id number](G8978): At least 20 percent but less than 40 percent impaired, limited or restricted Mobility: Walking and Moving Around Goal Status [phone]([id number]): 0 percent impaired, limited or restricted    Hendricks Limesmily Frieda Arnall 11/02/2015, 12:04 PM Hendricks LimesEmily Trust Crago, PT [phone]

## 2015-11-11 NOTE — Discharge Summary (Signed)
[facility] [place] [facility]   PATIENT NAME: Adriana Woods    MR#:  [id number]  DATE OF BIRTH:  Dec 27, 1955  DATE OF ADMISSION:  11/01/2015 ADMITTING PHYSICIAN: Milagros Loll, MD  DATE OF DISCHARGE: 11/02/2015  4:18 PM  PRIMARY CARE PHYSICIAN: No PCP Per Patient     ADMISSION DIAGNOSIS:  Paresthesia [R20.2] Left lower quadrant pain [R10.32] Chest pain, unspecified chest pain type [R07.9]  DISCHARGE DIAGNOSIS:  Principal Problem:   TIA (transient ischemic attack) Active Problems:   Headache   Tingling sensation   Dizziness   Drug abuse   Atypical chest pain   LLQ abdominal pain   SECONDARY DIAGNOSIS:   Past Medical History:  Diagnosis Date  . Arthritis   . Diverticulitis   . Heart murmur   . Hypertension   . Sarcoidosis (HCC)     .pro HOSPITAL COURSE:  The patient is 60 year old Caucasian female with past medical history significant for history of arthritis, diverticulitis, hypertension, sarcoidosis, who presents to the hospital with complaints of headache, dizziness, tingling sensation of left face and temporal region. chest pain, abdominal pain. On arrival to the hospital, she was found to have negative troponins 4, normal sedimentation rate, normal thyroid studies, normal seem bad and CBC, LDH of 203. EKG revealed normal sinus rhythm at 85 bpm, nonspecific inferior lateral ST-T changes. Patient complained of residual tingling sensation of left facial area, headache, surrounding all cranium. Her left arm felt heavy and weak. MRI of brain was performed and it showed no changes, MRA showed no dissection. Echo revealed no cardiac source of emboli with EF 55-60%. Hgb A1c was 7.7, LDL 203, triglycerides 278. The patient was seen by neurologist who felt she likely had complicated migraine, recommended not to give oxycodone, but continue norflex as it gave some relief, patient was also advised to continue NSAIDS prn, continue low dose of  aspirin daily. The patient was seen by cardiologist for chest pain, recommended poutpatient stress test for risk stratification. NO PT follow up was recommended by PT.  Discussion by problem: #1. Migraine Headache with tingling Sensation of the left face,  complicated migraine, can not completely rule out TIA, brain MRA/ MRI  revealed no acute intracranial abnormality, possible microvascular ischemia was noted, although demyelinating processes such as MS was also of concerned, neurologist saw the patient in consultation , recommended to continue asa, norflex, NSAIDS, the patient improved  clinically, discharged home .  #2, dizziness, initiate patient on meclizine with improvement.  #3. Drug abuse?, urine drug screen was positive for opiates and tricyclics, unclear if this study was done after opiates were given in emergency room, however, patient was not on tricyclics at home, may benefit from a psychologist evaluation as outpatient, drug abuse program if it is determined that patient is abusing.  #4. Chest pain , no cardiac injury. According to troponins, echocardiogram was [facility], outpatient nuclear stress test was recommended by cardiologist, patient is to follow up with cardiologist as outpatient #5. Left lower quadrant abdominal pain, likely  muscular, CT scan of abdomen showed no abnormalities, urinalysis is unremarkable, supportive therapy was provided, symptoms resolved  DISCHARGE CONDITIONS:   stable  CONSULTS OBTAINED:  Treatment Team:  Thana Farr, MD Iran Ouch, MD  DRUG ALLERGIES:   Allergies  Allergen Reactions  . Sulfa Antibiotics Itching and Swelling    DISCHARGE MEDICATIONS:   Discharge Medication List as of 11/02/2015  3:33 PM    START taking these medications   Details  butalbital-acetaminophen-caffeine (FIORICET, ESGIC) 50-325-40 MG tablet Take 1 tablet by mouth every 4 (four) hours as needed for headache or migraine., Starting Fri 11/02/2015, Print     meclizine (ANTIVERT) 25 MG tablet Take 1 tablet (25 mg total) by mouth 3 (three) times daily as needed for dizziness., Starting Fri 11/02/2015, Normal    orphenadrine (NORFLEX) 100 MG tablet Take 1 tablet (100 mg total) by mouth 2 (two) times daily., Starting Fri 11/02/2015, Normal      CONTINUE these medications which have CHANGED   Details  losartan (COZAAR) 100 MG tablet Take 1 tablet (100 mg total) by mouth daily., Starting Fri 11/02/2015, Print      CONTINUE these medications which have NOT CHANGED   Details  aspirin EC 81 MG tablet Take 81 mg by mouth daily., Historical Med         DISCHARGE INSTRUCTIONS:    Follow up with cardiology and PCP  If you experience worsening of your admission symptoms, develop shortness of breath, life threatening emergency, suicidal or homicidal thoughts you must seek medical attention immediately by calling 911 or calling your MD immediately  if symptoms less severe.  You Must read complete instructions/literature along with all the possible adverse reactions/side effects for all the Medicines you take and that have been prescribed to you. Take any new Medicines after you have completely understood and accept all the possible adverse reactions/side effects.   Please note  You were cared for by a hospitalist during your hospital stay. If you have any questions about your discharge medications or the care you received while you were in the hospital after you are discharged, you can call the unit and asked to speak with the hospitalist on call if the hospitalist that took care of you is not available. Once you are discharged, your primary care physician will handle any further medical issues. Please note that NO REFILLS for any discharge medications will be authorized once you are discharged, as it is imperative that you return to your primary care physician (or establish a relationship with a primary care physician if you do not have one) for your  aftercare needs so that they can reassess your need for medications and monitor your lab values.    Today   CHIEF COMPLAINT:   Chief Complaint  Patient presents with  . Migraine  . Numbness    HISTORY OF PRESENT ILLNESS:  Adriana Woods  is a 60 y.o. female with a known history of arthritis, diverticulitis, hypertension, sarcoidosis, who presents to the hospital with complaints of headache, dizziness, tingling sensation of left face and temporal region. chest pain, abdominal pain. On arrival to the hospital, she was found to have negative troponins 4, normal sedimentation rate, normal thyroid studies, normal seem bad and CBC, LDH of 203. EKG revealed normal sinus rhythm at 85 bpm, nonspecific inferior lateral ST-T changes. Patient complained of residual tingling sensation of left facial area, headache, surrounding all cranium. Her left arm felt heavy and weak. MRI of brain was performed and it showed no changes, MRA showed no dissection. Echo revealed no cardiac source of emboli with EF 55-60%. Hgb A1c was 7.7, LDL 203, triglycerides 278. The patient was seen by neurologist who felt she likely had complicated migraine, recommended not to give oxycodone, but continue norflex as it gave some relief, patient was also advised to continue NSAIDS prn, continue low dose of aspirin daily. The patient was seen by cardiologist for chest pain, recommended poutpatient stress test for risk  stratification. NO PT follow up was recommended by PT.  Discussion by problem: #1. Migraine Headache with tingling Sensation of the left face,  complicated migraine, can not completely rule out TIA, brain MRA/ MRI  revealed no acute intracranial abnormality, possible microvascular ischemia was noted, although demyelinating processes such as MS was also of concerned, neurologist saw the patient in consultation , recommended to continue asa, norflex, NSAIDS, the patient improved  clinically, discharged home .  #2,  dizziness, initiate patient on meclizine with improvement.  #3. Drug abuse?, urine drug screen was positive for opiates and tricyclics, unclear if this study was done after opiates were given in emergency room, however, patient was not on tricyclics at home, may benefit from a psychologist evaluation as outpatient, drug abuse program if it is determined that patient is abusing.  #4. Chest pain , no cardiac injury. According to troponins, echocardiogram was [facility], outpatient nuclear stress test was recommended by cardiologist, patient is to follow up with cardiologist as outpatient #5. Left lower quadrant abdominal pain, likely  muscular, CT scan of abdomen showed no abnormalities, urinalysis is unremarkable, supportive therapy was provided, symptoms resolved    VITAL SIGNS:  Blood pressure (!) 110/52, pulse 74, temperature 98.4 F (36.9 C), temperature source Oral, resp. rate 18, height 5\' 5"  (1.651 m), weight 86.2 kg (190 lb), SpO2 99 %.  I/O:  No intake or output data in the 24 hours ending 11/11/15 1744  PHYSICAL EXAMINATION:  GENERAL:  60 y.o.-year-old patient lying in the bed with no acute distress.  EYES: Pupils equal, round, reactive to light and accommodation. No scleral icterus. Extraocular muscles intact.  HEENT: Head atraumatic, normocephalic. Oropharynx and nasopharynx clear.  NECK:  Supple, no jugular venous distention. No thyroid enlargement, no tenderness.  LUNGS: Normal breath sounds bilaterally, no wheezing, rales,rhonchi or crepitation. No use of accessory muscles of respiration.  CARDIOVASCULAR: S1, S2 normal. No murmurs, rubs, or gallops.  ABDOMEN: Soft, non-tender, non-distended. Bowel sounds present. No organomegaly or mass.  EXTREMITIES: No pedal edema, cyanosis, or clubbing.  NEUROLOGIC: Cranial nerves II through XII are intact. Muscle strength 5/5 in all extremities. Sensation intact. Gait not checked.  PSYCHIATRIC: The patient is alert and oriented x 3.   SKIN: No obvious rash, lesion, or ulcer.   DATA REVIEW:   CBC No results for input(s): WBC, HGB, HCT, PLT in the last 168 hours.  Chemistries  No results for input(s): NA, K, CL, CO2, GLUCOSE, BUN, CREATININE, CALCIUM, MG, AST, ALT, ALKPHOS, BILITOT in the last 168 hours.  Invalid input(s): GFRCGP  Cardiac Enzymes No results for input(s): TROPONINI in the last 168 hours.  Microbiology Results  No results found for this or any previous visit.  RADIOLOGY:  No results found.  EKG:   Orders placed or performed during the hospital encounter of 11/01/15  . ED EKG  . ED EKG  . EKG 12-Lead  . EKG 12-Lead  . EKG 12-Lead  . EKG 12-Lead  . EKG      Management plans discussed with the patient, family and they are in agreement.  CODE STATUS:  Code Status History    Date Active Date Inactive Code Status Order ID Comments User Context   11/01/2015  6:42 PM 11/02/2015  7:50 AM Full Code [id number]  Tonye Royalty, DO Inpatient      TOTAL TIME TAKING CARE OF THIS PATIENT: 40 minutes.    Katharina Caper M.D on 11/11/2015 at 5:44 PM  Between 7am to 6pm - Pager - [phone]  After 6pm go to [email or web address] - password [email or web address] [facility]  [facility]  Office  [phone]  CC: Primary care physician; No PCP Per Patient

## 2015-11-12 ENCOUNTER — Encounter: Payer: Self-pay | Admitting: Emergency Medicine

## 2015-11-12 ENCOUNTER — Emergency Department
Admission: EM | Admit: 2015-11-12 | Discharge: 2015-11-12 | Disposition: A | Discharge status: Home / Self Care | Payer: Self-pay | Attending: Emergency Medicine | Admitting: Emergency Medicine

## 2015-11-12 DIAGNOSIS — I10 Essential (primary) hypertension: Secondary | ICD-10-CM | POA: Insufficient documentation

## 2015-11-12 DIAGNOSIS — N12 Tubulo-interstitial nephritis, not specified as acute or chronic: Secondary | ICD-10-CM | POA: Insufficient documentation

## 2015-11-12 DIAGNOSIS — Z7982 Long term (current) use of aspirin: Secondary | ICD-10-CM | POA: Insufficient documentation

## 2015-11-12 LAB — CBC
HCT: 40.3 % (ref 35.0–47.0)
Hemoglobin: 13.3 g/dL (ref 12.0–16.0)
MCH: 28.7 pg (ref 26.0–34.0)
MCHC: 33.1 g/dL (ref 32.0–36.0)
MCV: 86.9 fL (ref 80.0–100.0)
PLATELETS: 248 10*3/uL (ref 150–440)
RBC: 4.64 MIL/uL (ref 3.80–5.20)
RDW: 13.5 % (ref 11.5–14.5)
WBC: 7.4 10*3/uL (ref 3.6–11.0)

## 2015-11-12 LAB — URINALYSIS COMPLETE WITH MICROSCOPIC (ARMC ONLY)
Bilirubin Urine: NEGATIVE
GLUCOSE, UA: NEGATIVE mg/dL
KETONES UR: NEGATIVE mg/dL
Nitrite: NEGATIVE
PROTEIN: NEGATIVE mg/dL
Specific Gravity, Urine: 1.01 (ref 1.005–1.030)
pH: 5 (ref 5.0–8.0)

## 2015-11-12 LAB — COMPREHENSIVE METABOLIC PANEL
ALBUMIN: 4.6 g/dL (ref 3.5–5.0)
ALK PHOS: 81 U/L (ref 38–126)
ALT: 23 U/L (ref 14–54)
AST: 25 U/L (ref 15–41)
Anion gap: 11 (ref 5–15)
BUN: 17 mg/dL (ref 6–20)
CALCIUM: 9.3 mg/dL (ref 8.9–10.3)
CHLORIDE: 102 mmol/L (ref 101–111)
CO2: 24 mmol/L (ref 22–32)
CREATININE: 0.75 mg/dL (ref 0.44–1.00)
GFR calc non Af Amer: >60 mL/min (ref >60)
GLUCOSE: 97 mg/dL (ref 65–99)
Potassium: 3.6 mmol/L (ref 3.5–5.1)
SODIUM: 137 mmol/L (ref 135–145)
Total Bilirubin: 0.3 mg/dL (ref 0.3–1.2)
Total Protein: 8.2 g/dL — ABNORMAL HIGH (ref 6.5–8.1)

## 2015-11-12 LAB — LIPASE, BLOOD: LIPASE: 19 U/L (ref 11–51)

## 2015-11-12 LAB — POCT PREGNANCY, URINE: PREG TEST UR: NEGATIVE

## 2015-11-12 MED ORDER — MORPHINE SULFATE (PF) 2 MG/ML IV SOLN
2.0000 mg | Freq: Once | INTRAVENOUS | Status: AC
  Administered 2015-11-12: 2 mg via INTRAVENOUS
  Filled 2015-11-12: qty 1

## 2015-11-12 MED ORDER — CEPHALEXIN 500 MG PO CAPS: 500.0000 mg | ORAL_CAPSULE | Freq: Three times a day (TID) | ORAL | 0 refills | Status: DC

## 2015-11-12 MED ORDER — CEFTRIAXONE SODIUM 1 G IJ SOLR
1.0000 g | Freq: Once | INTRAMUSCULAR | Status: AC
  Administered 2015-11-12: 1 g via INTRAVENOUS
  Filled 2015-11-12: qty 10

## 2015-11-12 MED ORDER — NAPROXEN 500 MG PO TABS
500.0000 mg | ORAL_TABLET | Freq: Two times a day (BID) | ORAL | 0 refills | Status: AC
Start: 2015-11-12 — End: 2016-11-11

## 2015-11-12 MED ORDER — TRAMADOL HCL 50 MG PO TABS: 50.0000 mg | ORAL_TABLET | Freq: Four times a day (QID) | ORAL | 0 refills | Status: AC | PRN

## 2015-11-12 NOTE — ED Notes (Signed)
Pt reports right sided flank pain that is worse with movement and deep breaths - pain started yesterday and increasingly gotten worse - pt states she is having difficulty urinating (today only) - reports pain in right flank and lower groin area with urination - pt denies nausea/vomiting/diarrhea - pt reports history of kidney stone but states the pain does not feel the same

## 2015-11-12 NOTE — ED Triage Notes (Addendum)
C/O pain to left mid back x 1 day.  Pain worse with deep breathing or moving. Also c/o lower abdominal cramping x 1 hour

## 2015-11-12 NOTE — Discharge Instructions (Signed)
Please seek medical attention for any high fevers, chest pain, shortness of breath, change in behavior, persistent vomiting, bloody stool or any other new or concerning symptoms.  

## 2015-11-12 NOTE — ED Provider Notes (Signed)
[facility] Emergency Department Provider Note    ____________________________________________   I have reviewed the triage vital signs and the nursing notes.   HISTORY  Chief Complaint Back Pain and Abdominal Pain   History limited by: Not Limited   HPI Adriana Woods is a 60 y.o. female who presents to the emergency department today because of concerns for left lower back pain. She states that the pain started yesterday and is progressively gotten worse. It will become severe. It does wax and wane in its intensity. She states it is worse with movement and deep breaths. Also noticed that she has had decreased urination today. She thinks she might of noticed some bad odor to it as well. She says she does have a history of kidney stones and urinary tract infections. She denies any fevers. Denies any nausea or vomiting.   Past Medical History:  Diagnosis Date  . Arthritis   . Diverticulitis   . Heart murmur   . Hypertension   . Sarcoidosis [facility](HCC)     Patient Active Problem List   Diagnosis Date Noted  . Headache 11/02/2015  . Dizziness 11/02/2015  . Tingling sensation 11/02/2015  . Drug abuse 11/02/2015  . Atypical chest pain 11/02/2015  . LLQ abdominal pain 11/02/2015  . TIA (transient ischemic attack) 11/01/2015    Past Surgical History:  Procedure Laterality Date  . ABDOMINAL EXPLORATION SURGERY    . ABDOMINAL HYSTERECTOMY    . APPENDECTOMY    . CHOLECYSTECTOMY    . KNEE SURGERY      Prior to Admission medications   Medication Sig Start Date End Date Taking? Authorizing Provider  aspirin EC 81 MG tablet Take 81 mg by mouth daily.    Historical Provider, MD  butalbital-acetaminophen-caffeine (FIORICET, ESGIC) 50-325-40 MG tablet Take 1 tablet by mouth every 4 (four) hours as needed for headache or migraine. 11/02/15   Katharina Caperima Vaickute, MD  losartan (COZAAR) 100 MG tablet Take 1 tablet (100 mg total) by mouth daily. 11/02/15   Altamese DillingVaibhavkumar  Vachhani, MD  meclizine (ANTIVERT) 25 MG tablet Take 1 tablet (25 mg total) by mouth 3 (three) times daily as needed for dizziness. 11/02/15   Katharina Caperima Vaickute, MD  orphenadrine (NORFLEX) 100 MG tablet Take 1 tablet (100 mg total) by mouth 2 (two) times daily. 11/02/15   Katharina Caperima Vaickute, MD    Allergies Sulfa antibiotics  Family History  Problem Relation Age of Onset  . Hypertension Mother     Social History Social History  Substance Use Topics  . Smoking status: Never Smoker  . Smokeless tobacco: Never Used  . Alcohol use No    Review of Systems  Constitutional: Negative for fever. Cardiovascular: Negative for chest pain. Respiratory: Negative for shortness of breath. Gastrointestinal: Negative for abdominal pain, vomiting and diarrhea. Genitourinary: Negative for dysuria.Positive for decrease in urination Musculoskeletal: Positive for left lower back. Skin: Negative for rash. Neurological: Negative for headaches, focal weakness or numbness.   10-point ROS otherwise negative.  ____________________________________________   PHYSICAL EXAM:  VITAL SIGNS: ED Triage Vitals  Enc Vitals Group     BP 11/12/15 1817 (!) 158/78     Pulse Rate 11/12/15 1817 88     Resp 11/12/15 1817 16     Temp 11/12/15 1817 98.8 F (37.1 C)     Temp Source 11/12/15 1817 Oral     SpO2 11/12/15 1817 100 %     Weight 11/12/15 1818 198 lb (89.8 kg)     Height  11/12/15 1818 5\' 5"  (1.651 m)     Head Circumference --      Peak Flow --      Pain Score 11/12/15 1818 10   Constitutional: Alert and oriented. Well appearing and in no distress. Eyes: Conjunctivae are normal. PERRL. Normal extraocular movements. ENT   Head: Normocephalic and atraumatic.   Nose: No congestion/rhinnorhea.   Mouth/Throat: Mucous membranes are moist.   Neck: No stridor. Hematological/Lymphatic/Immunilogical: No cervical lymphadenopathy. Cardiovascular: Normal rate, regular rhythm.  No murmurs, rubs, or  gallops. Respiratory: Normal respiratory effort without tachypnea nor retractions. Breath sounds are clear and equal bilaterally. No wheezes/rales/rhonchi. Gastrointestinal: Soft and nontender. No distention. Mild left-sided CVA tenderness Genitourinary: Deferred Musculoskeletal: Normal range of motion in all extremities. No joint effusions.  No lower extremity tenderness nor edema. Neurologic:  Normal speech and language. No gross focal neurologic deficits are appreciated.  Skin:  Skin is warm, dry and intact. No rash noted. Psychiatric: Mood and affect are normal. Speech and behavior are normal. Patient exhibits appropriate insight and judgment.  ____________________________________________    LABS (pertinent positives/negatives)  Labs Reviewed  COMPREHENSIVE METABOLIC PANEL - Abnormal; Notable for the following:       Result Value   Total Protein 8.2 (*)    All other components within normal limits  URINALYSIS COMPLETEWITH MICROSCOPIC ([facility] ONLY) - Abnormal; Notable for the following:    Color, Urine YELLOW (*)    APPearance HAZY (*)    Hgb urine dipstick 1+ (*)    Leukocytes, UA 3+ (*)    Bacteria, UA RARE (*)    Squamous Epithelial / LPF 6-30 (*)    All other components within normal limits  LIPASE, BLOOD  CBC  POC URINE PREG, ED  POCT PREGNANCY, URINE     ____________________________________________   EKG  None  ____________________________________________    RADIOLOGY  None  ____________________________________________   PROCEDURES  Procedures  ____________________________________________   INITIAL IMPRESSION / ASSESSMENT AND PLAN / ED COURSE  Pertinent labs & imaging results that were available during my care of the patient were reviewed by me and considered in my medical decision making (see chart for details).  Presented to the emergency department today with left lower back pain. On exam patient has some mild left-sided CVA tenderness. Urine  is consistent with infection. At this point I think patient likely with pyelonephritis. Will give patient a dose of IV antibiotics here in prescription for antibiotics and pain medication. Did discuss return precautions.  ____________________________________________   FINAL CLINICAL IMPRESSION(S) / ED DIAGNOSES  Final diagnoses:  Pyelonephritis     Note: This dictation was prepared with Dragon dictation. Any transcriptional errors that result from this process are unintentional    Phineas SemenGraydon Jonte Wollam, MD 11/12/15 2133

## 2015-11-12 NOTE — ED Notes (Signed)
Pt is aware of need for urine sample but is unable to void at this time

## 2015-12-27 ENCOUNTER — Telehealth: Payer: Self-pay | Admitting: Internal Medicine

## 2015-12-27 NOTE — Telephone Encounter (Signed)
l mom for pt to schedule NP appt per fax request from PCP. Dr. Beverely LowElena Adamo

## 2016-01-15 ENCOUNTER — Ambulatory Visit: Payer: Self-pay | Admitting: Cardiology

## 2016-01-22 ENCOUNTER — Encounter: Payer: Self-pay | Admitting: Cardiology

## 2016-01-22 ENCOUNTER — Ambulatory Visit (INDEPENDENT_AMBULATORY_CARE_PROVIDER_SITE_OTHER): Payer: Self-pay | Admitting: Cardiology

## 2016-01-22 VITALS — BP 120/74 | HR 83 | Ht 64.0 in | Wt 197.0 lb

## 2016-01-22 DIAGNOSIS — E785 Hyperlipidemia, unspecified: Secondary | ICD-10-CM

## 2016-01-22 DIAGNOSIS — R079 Chest pain, unspecified: Secondary | ICD-10-CM

## 2016-01-22 DIAGNOSIS — E6609 Other obesity due to excess calories: Secondary | ICD-10-CM

## 2016-01-22 DIAGNOSIS — I351 Nonrheumatic aortic (valve) insufficiency: Secondary | ICD-10-CM

## 2016-01-22 DIAGNOSIS — R9431 Abnormal electrocardiogram [ECG] [EKG]: Secondary | ICD-10-CM

## 2016-01-22 DIAGNOSIS — I10 Essential (primary) hypertension: Secondary | ICD-10-CM

## 2016-01-22 DIAGNOSIS — Z6833 Body mass index (BMI) 33.0-33.9, adult: Secondary | ICD-10-CM

## 2016-01-22 NOTE — Progress Notes (Signed)
Cardiology Office Note   Date:  01/22/2016   ID:  [name], DOB 1955-12-19, MRN [id number]  Referring Doctor:  Abram SanderElena M Adamo, MD   Cardiologist:   Almond LintAileen Blaike Newburn, MD   Reason for consultation:  Chief Complaint  Patient presents with  . other    New patient. chest pressure.  . Shortness of Breath  . Chest Pain      History of Present Illness: Adriana Woods is a 60 y.o. female who presents for Evaluation of chest pain  Chest pain has been ongoing for several years now. She has never had a stress is in the past. She was in the hospital August 2017. Her chest pain was deemed to be more musculoskeletal in nature. It is described as sometimes a heaviness or tightness in the center of the chest, radiating to the back, may occur with exertion but not always exertional. Sometimes relief with change in position. Severity is moderate in intensity, lasting minutes but sometimes continuous for days. She was ruled out for acute coronary syndrome at that time and was scheduled to see us in the office for an outpatient stress test.  Patient also has some shortness of breath with exertion. No palpitations, PND, orthopnea. She will be discussing with PCP regarding some leg pains that occur at night.  ROS:  Please see the history of present illness. Aside from mentioned under HPI, all other systems are reviewed and negative.     Past Medical History:  Diagnosis Date  . Arthritis   . Diverticulitis   . Heart murmur   . Hypertension   . Sarcoidosis [facility](HCC)     Past Surgical History:  Procedure Laterality Date  . ABDOMINAL EXPLORATION SURGERY    . ABDOMINAL HYSTERECTOMY    . APPENDECTOMY    . CHOLECYSTECTOMY    . KNEE SURGERY       reports that she has quit smoking. She has never used smokeless tobacco. She reports that she does not drink alcohol.   family history includes Hypertension in her mother.   Outpatient Medications Prior to Visit  Medication Sig Dispense Refill   . aspirin EC 81 MG tablet Take 81 mg by mouth daily.    . butalbital-acetaminophen-caffeine (FIORICET, ESGIC) 50-325-40 MG tablet Take 1 tablet by mouth every 4 (four) hours as needed for headache or migraine. 14 tablet 0  . losartan (COZAAR) 100 MG tablet Take 1 tablet (100 mg total) by mouth daily. 30 tablet 0  . meclizine (ANTIVERT) 25 MG tablet Take 1 tablet (25 mg total) by mouth 3 (three) times daily as needed for dizziness. 30 tablet 0  . naproxen (NAPROSYN) 500 MG tablet Take 1 tablet (500 mg total) by mouth 2 (two) times daily with a meal. 30 tablet 0  . orphenadrine (NORFLEX) 100 MG tablet Take 1 tablet (100 mg total) by mouth 2 (two) times daily. 40 tablet 0  . traMADol (ULTRAM) 50 MG tablet Take 1 tablet (50 mg total) by mouth every 6 (six) hours as needed. 20 tablet 0  . cephALEXin (KEFLEX) 500 MG capsule Take 1 capsule (500 mg total) by mouth 3 (three) times daily. 30 capsule 0   No facility-administered medications prior to visit.      Allergies: Sulfa antibiotics and Tramadol    PHYSICAL EXAM: VS:  BP 120/74   Pulse 83   Ht 5\' 4"  (1.626 m)   Wt 197 lb (89.4 kg)   BMI 33.81 kg/m  , Body mass index is  33.81 kg/m. Wt Readings from Last 3 Encounters:  01/22/16 197 lb (89.4 kg)  11/12/15 198 lb (89.8 kg)  11/01/15 190 lb (86.2 kg)    GENERAL:  well developed, well nourished, obese, not in acute distress HEENT: normocephalic, pink conjunctivae, anicteric sclerae, no xanthelasma, normal dentition, oropharynx clear NECK:  no neck vein engorgement, JVP normal, no hepatojugular reflux, carotid upstroke brisk and symmetric, no bruit, no thyromegaly, no lymphadenopathy LUNGS:  good respiratory effort, clear to auscultation bilaterally CV:  PMI not displaced, no thrills, no lifts, S1 and S2 within normal limits, no palpable S3 or S4, no murmurs, no rubs, no gallops ABD:  Soft, nontender, nondistended, normoactive bowel sounds, no abdominal aortic bruit, no hepatomegaly, no  splenomegaly MS: nontender back, no kyphosis, no scoliosis, no joint deformities EXT:  2+ DP/PT pulses, no edema, no varicosities, no cyanosis, no clubbing SKIN: warm, nondiaphoretic, normal turgor, no ulcers NEUROPSYCH: alert, oriented to person, place, and time, sensory/motor grossly intact, normal mood, appropriate affect  Recent Labs: 11/01/2015: TSH 3.430 11/12/2015: ALT 23; BUN 17; Creatinine, Ser 0.75; Hemoglobin 13.3; Platelets 248; Potassium 3.6; Sodium 137   Lipid Panel    Component Value Date/Time   CHOL 292 (H) 11/02/2015 0701   TRIG 278 (H) 11/02/2015 0701   HDL 33 (L) 11/02/2015 0701   CHOLHDL 8.8 11/02/2015 0701   VLDL 56 (H) 11/02/2015 0701   LDLCALC 203 (H) 11/02/2015 0701     Other studies Reviewed:  EKG:  The ekg from 01/22/2016 was personally reviewed by me and it revealed sinus rhythm, 83 BPM. LVH with repolarization abnormality, Q waves in V1 to V5, possible anterolateral infarct. Abnormal EKG.  Additional studies/ records that were reviewed personally reviewed by me today include:  Echo 11/02/2015: Left ventricle: The cavity size was normal. There was mild   concentric hypertrophy. Systolic function was normal. The   estimated ejection fraction was in the range of 55% to 60%. Wall   motion was normal; there were no regional wall motion   abnormalities. Doppler parameters are consistent with abnormal   left ventricular relaxation (grade 1 diastolic dysfunction). - Aortic valve: There was trivial regurgitation. - Mitral valve: There was mild regurgitation. - Pulmonary arteries: Systolic pressure was within the normal   range.   ASSESSMENT AND PLAN: Chest pain, with some atypical features Risk factors for CAD include hypertension, diabetes, hyperlipidemia, family history of premature CAD, previous smoking history, obesity neck slight recommend further evaluation with nuclear stress test. Patient will like to walk on the treadmill. If unable to reach target  heart rate, recommend pharmacologic nuclear stress test.  Trivial AI Serial monitoring with echocardiogram likely in 2 years.  Hypertension BP is well controlled. Continue monitoring BP. Continue current medical therapy and lifestyle changes.  Hyperlipidemia Recommended LDL goal is less than 70.  Obesity Body mass index is 33.81 kg/m.Marland Kitchen. Recommend aggressive weight loss through diet and increased physical activity. Once cardiac workup is completed    Current medicines are reviewed at length with the patient today.  The patient does not have concerns regarding medicines.  Labs/ tests ordered today include:  Orders Placed This Encounter  Procedures  . NM Myocar Multi W/Spect W/Wall Motion / EF  . EKG 12-Lead    I had a lengthy and detailed discussion with the patient regarding diagnoses, prognosis, diagnostic options, treatment options , and side effects of medications.   I counseled the patient on importance of lifestyle modification including heart healthy diet, regular physical activity once  cardiac workup is complete   Disposition:   FU with undersigned after tests   I spent at least 40 minutes with the patient today and more than 50% of the time was spent counseling the patient and coordinating care.    Signed, Almond Lint, MD  01/22/2016 11:32 AM    [facility]  This note was generated in part with voice recognition software and I apologize for any typographical errors that were not detected and corrected.

## 2016-01-22 NOTE — Patient Instructions (Signed)
Testing/Procedures: [facility] MY[facility]  Your caregiver has ordered a Stress Test with nuclear imaging. The purpose of this test is to evaluate the blood supply to your heart muscle. This procedure is referred to as a "Non-Invasive Stress Test." This is because other than having an IV started in your vein, nothing is inserted or "invades" your body. Cardiac stress tests are done to find areas of poor blood flow to the heart by determining the extent of coronary artery disease (CAD). Some patients exercise on a treadmill, which naturally increases the blood flow to your heart, while others who are  unable to walk on a treadmill due to physical limitations have a pharmacologic/chemical stress agent called Lexiscan . This medicine will mimic walking on a treadmill by temporarily increasing your coronary blood flow.   Please note: these test may take anywhere between 2-4 hours to complete  PLEASE REPORT TO [facility] ENTRANCE  THE VOLUNTEERS AT THE FIRST DESK WILL DIRECT YOU WHERE TO GO  Date of Procedure:_Wednesday January 30, 2016 at 08:30AM_  Arrival Time for Procedure:_Arrive at 08:15AM to register__  Instructions regarding medication:   __X__ : Hold diabetes medication Metformin (Glucophage) morning of procedure  __X__:  Hold Losartan-hydrochlorothiazide the morning of your test.    PLEASE NOTIFY THE OFFICE AT LEAST 24 HOURS IN ADVANCE IF YOU ARE UNABLE TO KEEP YOUR APPOINTMENT.  [phone] AND  PLEASE NOTIFY [facility]IC[facility] AT [facility] AT LEAST 24 HOURS IN ADVANCE IF YOU ARE UNABLE TO KEEP YOUR APPOINTMENT. [phone]  How to prepare for your Myoview test:  1. Do not eat or drink after midnight 2. No caffeine for 24 hours prior to test 3. No smoking 24 hours prior to test. 4. Your medication may be taken with water.  If your doctor stopped a medication because of this test, do not take that medication. 5. Ladies, please do not wear dresses.  Skirts or pants are appropriate. Please  wear a short sleeve shirt. 6. No perfume, cologne or lotion. 7. Wear comfortable walking shoes. No heels!   Follow-Up: Your physician wants you to follow-up in: 6 months with Dr. Alvino ChapelIngal. You will receive a reminder letter in the mail two months in advance. If you don't receive a letter, please call our office to schedule the follow-up appointment.  It was a pleasure seeing you today here in the office. Please do not hesitate to give us a call back if you have any further questions. [phone]  Thurston CellarPamela A. RN, BSN     Cardiac Nuclear Scanning A cardiac nuclear scan is used to check your heart for problems, such as the following:  A portion of the heart is not getting enough blood.  Part of the heart muscle has died, which happens with a heart attack.  The heart wall is not working normally.  In this test, a radioactive dye (tracer) is injected into your bloodstream. After the tracer has traveled to your heart, a scanning device is used to measure how much of the tracer is absorbed by or distributed to various areas of your heart. LET [facility] KNOW ABOUT:  Any allergies you have.  All medicines you are taking, including vitamins, herbs, eye drops, creams, and over-the-counter medicines.  Previous problems you or members of your family have had with the use of anesthetics.  Any blood disorders you have.  Previous surgeries you have had.  Medical conditions you have.  RISKS AND COMPLICATIONS Generally, this is a safe procedure. However, as  with any procedure, problems can occur. Possible problems include:   Serious chest pain.  Rapid heartbeat.  Sensation of warmth in your chest. This usually passes quickly. BEFORE THE PROCEDURE Ask your health care provider about changing or stopping your regular medicines. PROCEDURE This procedure is usually done at a hospital and takes 2-4 hours.  An IV tube is inserted into one of your veins.  Your health care  provider will inject a small amount of radioactive tracer through the tube.  You will then wait for 20-40 minutes while the tracer travels through your bloodstream.  You will lie down on an exam table so images of your heart can be taken. Images will be taken for about 15-20 minutes.  You will exercise on a treadmill or stationary bike. While you exercise, your heart activity will be monitored with an electrocardiogram (ECG), and your blood pressure will be checked.  If you are unable to exercise, you may be given a medicine to make your heart beat faster.  When blood flow to your heart has peaked, tracer will again be injected through the IV tube.  After 20-40 minutes, you will get back on the exam table and have more images taken of your heart.  When the procedure is over, your IV tube will be removed. AFTER THE PROCEDURE  You will likely be able to leave shortly after the test. Unless your health care provider tells you otherwise, you may return to your normal schedule, including diet, activities, and medicines.  Make sure you find out how and when you will get your test results.   This information is not intended to replace advice given to you by your health care provider. Make sure you discuss any questions you have with your health care provider.   Document Released: 04/04/2004 Document Revised: 03/15/2013 Document Reviewed: 02/16/2013 Elsevier Interactive Patient Education [employer]2016 [employer]

## 2016-01-29 ENCOUNTER — Telehealth: Payer: Self-pay | Admitting: Cardiology

## 2016-01-29 NOTE — Telephone Encounter (Signed)
Spoke with patient and was able to reschedule stress test for next week. She verbalized understanding of date, time, location, and instructions with no further questions at this time.

## 2016-01-29 NOTE — Telephone Encounter (Signed)
Left voicemail message to call back  

## 2016-01-29 NOTE — Telephone Encounter (Signed)
Pt calling stating she needs to reschedule her Myoview for tomorrow  She states she is not feeling well Please call back to reschedule

## 2016-01-30 ENCOUNTER — Encounter: Admission: RE | Admit: 2016-01-30 | Payer: Self-pay | Source: Ambulatory Visit

## 2016-02-07 ENCOUNTER — Encounter
Admission: RE | Admit: 2016-02-07 | Discharge: 2016-02-07 | Disposition: A | Discharge status: Home / Self Care | Payer: Self-pay | Source: Ambulatory Visit | Attending: Cardiology | Admitting: Cardiology

## 2016-02-07 DIAGNOSIS — R9431 Abnormal electrocardiogram [ECG] [EKG]: Secondary | ICD-10-CM | POA: Insufficient documentation

## 2016-02-07 DIAGNOSIS — R079 Chest pain, unspecified: Secondary | ICD-10-CM | POA: Insufficient documentation

## 2016-02-07 HISTORY — DX: Type 2 diabetes mellitus without complications: E11.9

## 2016-02-07 LAB — NM MYOCAR MULTI W/SPECT W/WALL MOTION / EF
CHL CUP NUCLEAR SDS: 0
CHL CUP NUCLEAR SRS: 5
CHL CUP NUCLEAR SSS: 0
Estimated workload: 6.7 METS
Exercise duration (min): 4 min
Exercise duration (sec): 47 s
LVDIAVOL: 57 mL (ref 46–106)
LVSYSVOL: 20 mL
Peak HR: 144 {beats}/min
Percent HR: 88 %
Rest HR: 76 {beats}/min
TID: 0.71

## 2016-02-07 MED ORDER — TECHNETIUM TC 99M TETROFOSMIN IV KIT
13.0000 | PACK | Freq: Once | INTRAVENOUS | Status: AC | PRN
  Administered 2016-02-07: 12.478 via INTRAVENOUS

## 2016-02-07 MED ORDER — TECHNETIUM TC 99M TETROFOSMIN IV KIT
32.7840 | PACK | Freq: Once | INTRAVENOUS | Status: AC | PRN
  Administered 2016-02-07: 32.784 via INTRAVENOUS

## 2016-09-14 ENCOUNTER — Emergency Department: Payer: Medicare Other

## 2016-09-14 ENCOUNTER — Emergency Department
Admission: EM | Admit: 2016-09-14 | Discharge: 2016-09-14 | Disposition: A | Discharge status: Home / Self Care | Payer: Medicare Other | Attending: Emergency Medicine | Admitting: Emergency Medicine

## 2016-09-14 ENCOUNTER — Encounter: Payer: Self-pay | Admitting: Emergency Medicine

## 2016-09-14 DIAGNOSIS — E119 Type 2 diabetes mellitus without complications: Secondary | ICD-10-CM | POA: Insufficient documentation

## 2016-09-14 DIAGNOSIS — I10 Essential (primary) hypertension: Secondary | ICD-10-CM | POA: Insufficient documentation

## 2016-09-14 DIAGNOSIS — Z7982 Long term (current) use of aspirin: Secondary | ICD-10-CM | POA: Insufficient documentation

## 2016-09-14 DIAGNOSIS — Z79899 Other long term (current) drug therapy: Secondary | ICD-10-CM | POA: Diagnosis not present

## 2016-09-14 DIAGNOSIS — Z87891 Personal history of nicotine dependence: Secondary | ICD-10-CM | POA: Diagnosis not present

## 2016-09-14 DIAGNOSIS — M791 Myalgia: Secondary | ICD-10-CM | POA: Insufficient documentation

## 2016-09-14 DIAGNOSIS — R0789 Other chest pain: Secondary | ICD-10-CM | POA: Insufficient documentation

## 2016-09-14 DIAGNOSIS — Z7984 Long term (current) use of oral hypoglycemic drugs: Secondary | ICD-10-CM | POA: Insufficient documentation

## 2016-09-14 DIAGNOSIS — Z8673 Personal history of transient ischemic attack (TIA), and cerebral infarction without residual deficits: Secondary | ICD-10-CM | POA: Insufficient documentation

## 2016-09-14 DIAGNOSIS — R079 Chest pain, unspecified: Secondary | ICD-10-CM

## 2016-09-14 LAB — BASIC METABOLIC PANEL
Anion gap: 11 (ref 5–15)
BUN: 14 mg/dL (ref 6–20)
CALCIUM: 9.6 mg/dL (ref 8.9–10.3)
CO2: 29 mmol/L (ref 22–32)
CREATININE: 0.66 mg/dL (ref 0.44–1.00)
Chloride: 96 mmol/L — ABNORMAL LOW (ref 101–111)
GFR calc non Af Amer: >60 mL/min (ref >60)
GLUCOSE: 158 mg/dL — AB (ref 65–99)
Potassium: 3.3 mmol/L — ABNORMAL LOW (ref 3.5–5.1)
Sodium: 136 mmol/L (ref 135–145)

## 2016-09-14 LAB — CBC
HCT: 38.2 % (ref 35.0–47.0)
Hemoglobin: 12.8 g/dL (ref 12.0–16.0)
MCH: 28.1 pg (ref 26.0–34.0)
MCHC: 33.6 g/dL (ref 32.0–36.0)
MCV: 83.5 fL (ref 80.0–100.0)
PLATELETS: 270 10*3/uL (ref 150–440)
RBC: 4.57 MIL/uL (ref 3.80–5.20)
RDW: 14.1 % (ref 11.5–14.5)
WBC: 6 10*3/uL (ref 3.6–11.0)

## 2016-09-14 LAB — TROPONIN I

## 2016-09-14 MED ORDER — MORPHINE SULFATE (PF) 4 MG/ML IV SOLN
4.0000 mg | Freq: Once | INTRAVENOUS | Status: AC
  Administered 2016-09-14: 4 mg via INTRAVENOUS
  Filled 2016-09-14: qty 1

## 2016-09-14 MED ORDER — PREDNISONE 10 MG PO TABS: 10.0000 mg | ORAL_TABLET | Freq: Every day | ORAL | 0 refills | Status: AC

## 2016-09-14 MED ORDER — ONDANSETRON HCL 4 MG/2ML IJ SOLN
4.0000 mg | Freq: Once | INTRAMUSCULAR | Status: AC
  Administered 2016-09-14: 4 mg via INTRAVENOUS
  Filled 2016-09-14: qty 2

## 2016-09-14 MED ORDER — HYDROCODONE-ACETAMINOPHEN 5-325 MG PO TABS: 1.0000 | ORAL_TABLET | ORAL | 0 refills | Status: DC | PRN

## 2016-09-14 MED ORDER — METHYLPREDNISOLONE SODIUM SUCC 125 MG IJ SOLR
125.0000 mg | Freq: Once | INTRAMUSCULAR | Status: AC
  Administered 2016-09-14: 125 mg via INTRAVENOUS
  Filled 2016-09-14: qty 2

## 2016-09-14 NOTE — ED Provider Notes (Signed)
[facility] Emergency Department Provider Note  Time seen: 6:07 PM  I have reviewed the triage vital signs and the nursing notes.   HISTORY  Chief Complaint Chest Pain    HPI Adriana Woods is a 61 y.o. female with a past medical history of arthritis, diabetes, hypertension, sarcoidosis, presents to the emergency department for right chest and shoulder pain. According to the patient for the past several months she has been experiencing right chest and right shoulder pain although worse over the past 2 weeks. Denies any shortness of breath, nausea or diaphoresis. States the pain is much worse with movement of her neck or right arm/shoulder. Denies any trauma. Denies any fever. Review of systems largely negative.  Past Medical History:  Diagnosis Date  . Arthritis   . Diabetes mellitus without complication (HCC)   . Diverticulitis   . Heart murmur   . Hypertension   . Sarcoidosis     Patient Active Problem List   Diagnosis Date Noted  . Headache 11/02/2015  . Dizziness 11/02/2015  . Tingling sensation 11/02/2015  . Drug abuse 11/02/2015  . Atypical chest pain 11/02/2015  . LLQ abdominal pain 11/02/2015  . TIA (transient ischemic attack) 11/01/2015    Past Surgical History:  Procedure Laterality Date  . ABDOMINAL EXPLORATION SURGERY    . ABDOMINAL HYSTERECTOMY    . APPENDECTOMY    . CHOLECYSTECTOMY    . KNEE SURGERY      Prior to Admission medications   Medication Sig Start Date End Date Taking? Authorizing Provider  aspirin EC 81 MG tablet Take 81 mg by mouth daily.    [provider]  atorvastatin (LIPITOR) 40 MG tablet Take 40 mg by mouth daily at 6 PM.    [provider]  butalbital-acetaminophen-caffeine (FIORICET, ESGIC) 50-325-40 MG tablet Take 1 tablet by mouth every 4 (four) hours as needed for headache or migraine. 11/02/15   Katharina Caper, MD  DULoxetine (CYMBALTA) 30 MG capsule Take 30 mg by mouth daily.     [provider]  hydrOXYzine (ATARAX/VISTARIL) 10 MG tablet Take 10 mg by mouth 3 (three) times daily as needed.    [provider]  ibuprofen (ADVIL,MOTRIN) 600 MG tablet Take 600 mg by mouth every 6 (six) hours as needed.    [provider]  losartan (COZAAR) 100 MG tablet Take 1 tablet (100 mg total) by mouth daily. 11/02/15   Altamese Dilling, MD  losartan-hydrochlorothiazide (HYZAAR) 100-25 MG tablet Take 1 tablet by mouth daily.    [provider]  meclizine (ANTIVERT) 25 MG tablet Take 1 tablet (25 mg total) by mouth 3 (three) times daily as needed for dizziness. 11/02/15   Katharina Caper, MD  metFORMIN (GLUCOPHAGE) 500 MG tablet Take 500 mg by mouth 2 (two) times daily with a meal.    [provider]  naproxen (NAPROSYN) 500 MG tablet Take 1 tablet (500 mg total) by mouth 2 (two) times daily with a meal. 11/12/15 11/11/16  Phineas Semen, MD  orphenadrine (NORFLEX) 100 MG tablet Take 1 tablet (100 mg total) by mouth 2 (two) times daily. 11/02/15   Katharina Caper, MD  traMADol (ULTRAM) 50 MG tablet Take 1 tablet (50 mg total) by mouth every 6 (six) hours as needed. 11/12/15 11/11/16  Phineas Semen, MD    Allergies  Allergen Reactions  . Sulfa Antibiotics Itching and Swelling  . Tramadol Other (See Comments)    Upset stomach    Family History  Problem Relation Age  of Onset  . Hypertension Mother     Social History Social History  Substance Use Topics  . Smoking status: Former Games developermoker  . Smokeless tobacco: Never Used  . Alcohol use No    Review of Systems Constitutional: Negative for fever. Cardiovascular: Negative for chest pain. Respiratory: Negative for shortness of breath. Gastrointestinal: Negative for abdominal pain, vomiting  Musculoskeletal: Negative for back pain. Skin: Negative for rash. Neurological: Negative for headache All other ROS negative  ____________________________________________   PHYSICAL  EXAM:  VITAL SIGNS: ED Triage Vitals  Enc Vitals Group     BP 09/14/16 1502 118/74     Pulse Rate 09/14/16 1502 95     Resp 09/14/16 1502 18     Temp 09/14/16 1502 98.6 F (37 C)     Temp Source 09/14/16 1502 Oral     SpO2 09/14/16 1502 100 %     Weight 09/14/16 1500 195 lb (88.5 kg)     Height 09/14/16 1500 5\' 5"  (1.651 m)     Head Circumference --      Peak Flow --      Pain Score 09/14/16 1459 9     Pain Loc --      Pain Edu? --      Excl. in GC? --     Constitutional: Alert and oriented. Well appearing and in no distress. Eyes: Normal exam ENT   Head: Normocephalic and atraumatic.   Mouth/Throat: Mucous membranes are moist. Cardiovascular: Normal rate, regular rhythm. No murmur Respiratory: Normal respiratory effort without tachypnea nor retractions. Breath sounds are clear  Gastrointestinal: Soft and nontender. No distention. Musculoskeletal: Moderate right shoulder tenderness, right trapezius tenderness. Worse with range of motion, but does have good range of motion in the right shoulder. No concern for fracture or dislocation. Neurologic:  Normal speech and language. No gross focal neurologic deficits  Skin:  Skin is warm, dry and intact.  Psychiatric: Mood and affect are normal.   ____________________________________________    EKG  EKG reviewed and interpreted by myself shows normal sinus rhythm at 90 bpm, narrow QRS, left axis deviation, slight QTC prolongation, nonspecific ST changes.  ____________________________________________    RADIOLOGY  Chest x-ray negative, bilateral mediastinal adenopathy, but not significant weight change.  ____________________________________________   INITIAL IMPRESSION / ASSESSMENT AND PLAN / ED COURSE  Pertinent labs & imaging results that were available during my care of the patient were reviewed by me and considered in my medical decision making (see chart for details).  Patient presents the emergency  department for months of right shoulder/right chest pain worse with movement. Patient's labs including heart enzymes are negative. X-ray is unchanged. Patient does have a history of sarcoidosis. Pain is very reproducible with palpation or movement of the right shoulder. Highly suspect muscle skeletal-type pain. We will discharge the patient on a tapered prednisone, Norco to be used as needed for pain relief. Patient agreeable to this plan.  ____________________________________________   FINAL CLINICAL IMPRESSION(S) / ED DIAGNOSES  Musculoskeletal pain Chest pain    Minna AntisPaduchowski, Yuniel Blaney, MD 09/14/16 1811

## 2016-09-14 NOTE — ED Triage Notes (Signed)
Pt c/o right chest pain radiating to right arm and axillary area for several months. Pt had procedure several years ago to right axillary for cyst. Pt c/o some shortness of breath with the chest pain also.  Pt states chest pain worsened in the past several days.

## 2016-09-14 NOTE — Discharge Instructions (Signed)
You have been seen in the emergency department today for chest pain. Your pain could very likely be due to sarcoidosis or musculoskeletal pain. As we discussed please follow-up with your primary care physician in the next 1-2 days for recheck. Return to the emergency department for any further chest pain, trouble breathing, or any other symptom personally concerning to yourself.

## 2016-10-16 ENCOUNTER — Telehealth: Payer: Self-pay | Admitting: Cardiology

## 2016-10-16 NOTE — Telephone Encounter (Signed)
3 attempts to schedule fu appt from recall list.   Deleting recall.   6 month fu per ckout 01/22/16

## 2017-06-27 NOTE — Progress Notes (Signed)
Cardiology Office Note  Date:  06/30/2017   ID:  Shital Crayton, DOB May 31, 1955, MRN [id number]  PCP:  Abram Sander, MD   Chief Complaint  Patient presents with  . Other    Former [facility] pt LS 2017 c/o chest pain, anxious, low sugar levels and trembling, pt went to [facility] due to pneumonia 06/18/17. Meds reviewed verbally with pt.    HPI:  Adriana Woods is a 62 y.o. 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 CT chest and biopsies which revealed sarcoid ([place] at [facility])  Her mixed obstructive and restrictive lung disease Long history of chronic atypical chest pain for several years  Previously in the hospital August 2017 felt to have  chest pain that was musculoskeletal in nature. Who presents for follow-up for her chest pain symptoms  Previous records reviewed in detail Describing long history of chest pain, heaviness, radiating to the left shoulder Sometimes at rest, sometimes with exertion, sometimes with relief on change in position  Stress test 01/2016 No ischemia 6.7 METS  Seen in the ER 09/14/2016 with chest pain Atypical  Echo 10/2015: normal EF, normal RVSP  In follow-up today she reports having recent  PNA,  Was given in the  [facility]  Still feels poorly, was only discharged on 3 days of antibiotics Seen by primary care doctor today, placed back on antibiotics for 2 weeks  Records reviewed from the hospital including: Echo showing normal LV function, no significant valve disease Cardiac MRI: Essentially normal study with no cardiac sarcoid  CT chest: Bulky confluent mediastinal and bilateral hilar lymphadenopathy compatible with underlying sarcoidosis.  Also having episodes of dizziness, orthostasis Medications changed by primary care, HCTZ held now on losartan 100, has not started yet  EKG personally reviewed by myself on  todays visit Shows normal sinus rhythm rate 84 bpm left axis deviation  Recently started on Lipitor dosing increased up to 80 mg daily  Total cholesterol recently was 300 with LDL 213   PMH:   has a past medical history of Arthritis, Diabetes mellitus without complication (HCC), Diverticulitis, Heart murmur, Hypertension, and Sarcoidosis.  PSH:    Past Surgical History:  Procedure Laterality Date  . ABDOMINAL EXPLORATION SURGERY    . ABDOMINAL HYSTERECTOMY    . APPENDECTOMY    . CHOLECYSTECTOMY    . KNEE SURGERY      Current Outpatient Medications  Medication Sig Dispense Refill  . albuterol (PROVENTIL) (2.5 MG/3ML) 0.083% nebulizer solution as needed.    Marland Kitchen amitriptyline (ELAVIL) 100 MG tablet Take by mouth daily.    Marland Kitchen aspirin EC 81 MG tablet Take 81 mg by mouth daily.    Marland Kitchen atorvastatin (LIPITOR) 80 MG tablet Take by mouth daily.    . baclofen (LIORESAL) 10 MG tablet Take by mouth as needed.    . butalbital-acetaminophen-caffeine (FIORICET, ESGIC) 50-325-40 MG tablet Take 1 tablet by mouth every 4 (four) hours as needed for headache or migraine. 14 tablet 0  . Fluticasone-Salmeterol (ADVAIR DISKUS) 250-50 MCG/DOSE AEPB Inhale into the lungs 2 (two) times daily.    Marland Kitchen gabapentin (NEURONTIN) 300 MG capsule Take by mouth daily.    Marland Kitchen glipiZIDE (GLUCOTROL) 10 MG tablet Take by mouth 3 (three) times daily.    . hydrOXYzine (ATARAX/VISTARIL) 25 MG tablet Take 25 mg by mouth as needed.    . insulin glargine (LANTUS) 100 UNIT/ML injection Inject 20  Units into the skin daily.    Marland Kitchen losartan-hydrochlorothiazide (HYZAAR) 100-25 MG tablet Take 1 tablet by mouth daily.    . meclizine (ANTIVERT) 25 MG tablet Take 1 tablet (25 mg total) by mouth 3 (three) times daily as needed for dizziness. 30 tablet 0  . predniSONE (DELTASONE) 10 MG tablet Take 1 tablet (10 mg total) by mouth daily. Day 1-3: take 4 tablets PO daily Day 4-6: take 3 tablets PO daily Day 7-9: take 2 tablets PO daily Day 10-12:  take 1 tablet PO daily 30 tablet 0   No current facility-administered medications for this visit.      Allergies:   Sulfa antibiotics and Tramadol   Social History:  The patient  reports that she has quit smoking. She has never used smokeless tobacco. She reports that she does not drink alcohol.   Family History:   family history includes Hypertension in her mother.    Review of Systems: Review of Systems  Constitutional: Negative.   Respiratory: Negative.   Cardiovascular: Positive for chest pain.  Gastrointestinal: Negative.   Musculoskeletal: Negative.   Neurological: Negative.   Psychiatric/Behavioral: Negative.   All other systems reviewed and are negative.    PHYSICAL EXAM: VS:  BP 124/76 (BP Location: Left Arm, Patient Position: Sitting, Cuff Size: Large)   Pulse 84   Ht 5\' 4"  (1.626 m)   Wt 204 lb 4 oz (92.6 kg)   BMI 35.06 kg/m  , BMI Body mass index is 35.06 kg/m. GEN: Well nourished, well developed, in no acute distress , obese HEENT: normal  Neck: no JVD, carotid bruits, or masses Cardiac: RRR; no murmurs, rubs, or gallops,no edema  Respiratory:  clear to auscultation bilaterally, normal work of breathing GI: soft, nontender, nondistended, + BS MS: no deformity or atrophy  Skin: warm and dry, no rash Neuro:  Strength and sensation are intact Psych: euthymic mood, full affect    Recent Labs: 09/14/2016: BUN 14; Creatinine, Ser 0.66; Hemoglobin 12.8; Platelets 270; Potassium 3.3; Sodium 136    Lipid Panel Lab Results  Component Value Date   CHOL 292 (H) 11/02/2015   HDL 33 (L) 11/02/2015   LDLCALC 203 (H) 11/02/2015   TRIG 278 (H) 11/02/2015      Wt Readings from Last 3 Encounters:  06/30/17 204 lb 4 oz (92.6 kg)  09/14/16 195 lb (88.5 kg)  01/22/16 197 lb (89.4 kg)       ASSESSMENT AND PLAN:  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 Again documented on recent hospitalization at [facility] Scheduled to see pulmonary  at [facility] Significant mediastinal lymphadenopathy  Atypical chest pain - Plan: EKG 12-Lead Previous stress test with no ischemia, CT scans with no coronary calcification mentioned Recent cardiac MRI essentially normal Atypical in presentation,   No ischemic workup at this time More  concerning for musculoskeletal etiology  Hyperlipidemia, unspecified hyperlipidemia type Tolerating Lipitor 80 mg so far If numbers continue to run high could add Zetia 10 mg daily Discussed medication side effects of statins  Essential hypertension Recent medications by primary care, HCTZ held given orthostasis symptoms She will continue on losartan 100 daily If she continues to have dizziness, recommended she call us.  We may need to decrease losartan down to 50 mg daily   Orthostasis As above, having orthostasis symptoms Plan as above  Disposition:   F/U  12 months  As needed   Total encounter time more than 45 minutes  Greater than 50% was spent  in counseling and coordination of care with the patient   Orders Placed This Encounter  Procedures  . EKG 12-Lead     Signed, Dossie Arbourim Teigen Parslow, M.D., Ph.D. 06/30/2017  [facility] [place] [phone]

## 2017-06-30 ENCOUNTER — Ambulatory Visit (INDEPENDENT_AMBULATORY_CARE_PROVIDER_SITE_OTHER): Payer: Medicare Other | Admitting: Cardiovascular Disease

## 2017-06-30 ENCOUNTER — Encounter: Payer: Self-pay | Admitting: Cardiovascular Disease

## 2017-06-30 VITALS — BP 124/76 | HR 84 | Ht 64.0 in | Wt 204.2 lb

## 2017-06-30 DIAGNOSIS — R0789 Other chest pain: Secondary | ICD-10-CM

## 2017-06-30 DIAGNOSIS — D860 Sarcoidosis of lung: Secondary | ICD-10-CM | POA: Diagnosis not present

## 2017-06-30 DIAGNOSIS — I10 Essential (primary) hypertension: Secondary | ICD-10-CM | POA: Insufficient documentation

## 2017-06-30 DIAGNOSIS — E785 Hyperlipidemia, unspecified: Secondary | ICD-10-CM | POA: Diagnosis not present

## 2017-06-30 DIAGNOSIS — I951 Orthostatic hypotension: Secondary | ICD-10-CM | POA: Insufficient documentation

## 2017-06-30 DIAGNOSIS — R42 Dizziness and giddiness: Secondary | ICD-10-CM | POA: Diagnosis not present

## 2017-06-30 NOTE — Patient Instructions (Signed)
Medication Instructions:   No medication changes made  Labwork:  No new labs needed  Testing/Procedures:  No further testing at this time   Follow-Up: It was a pleasure seeing you in the office today. Please call us if you have new issues that need to be addressed before your next appt.  [phone]  Your physician wants you to follow-up in: 12 month as needed.  You will receive a reminder letter in the mail two months in advance. If you don't receive a letter, please call our office to schedule the follow-up appointment.  If you need a refill on your cardiac medications before your next appointment, please call your pharmacy.  For educational health videos Log in to : [email or web address] Or : [email or web address], password : [email or web address]

## 2017-12-24 IMAGING — CT CT HEAD CODE STROKE
3 series · 15 of 45 positions shown, 18 images · non-contrast
Comparison: None.

CLINICAL DATA: Code stroke. 60-year-old female with headache,
dizziness and left face tingling beginning at 9996 hours today.
Initial encounter.

EXAM:
CT HEAD WITHOUT CONTRAST
TECHNIQUE: Contiguous axial images were obtained from the base of the skull
through the vertex without intravenous contrast.

[Series 2: head wo · axial · 0.39mm/px · z∈[-71,+44]mm · 9 of 28 slices shown, 12 images]
[im 3/28  brain]
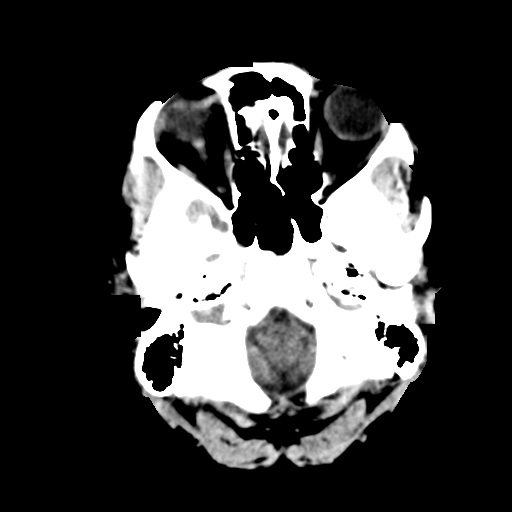
[im 3/28  bone]
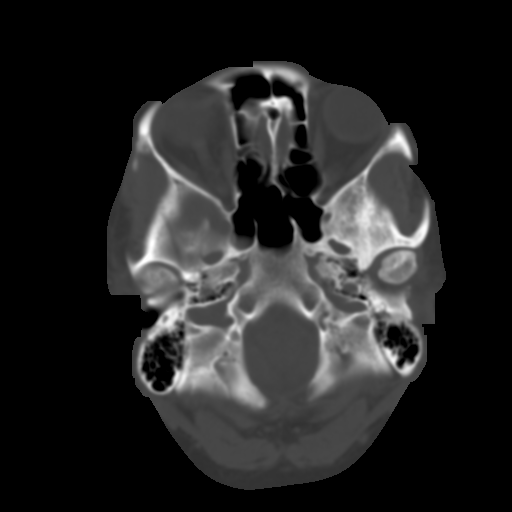
[im 6/28  brain]
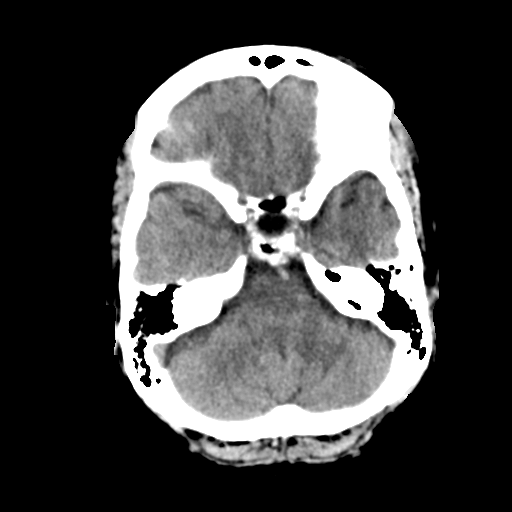
[im 9/28  brain]
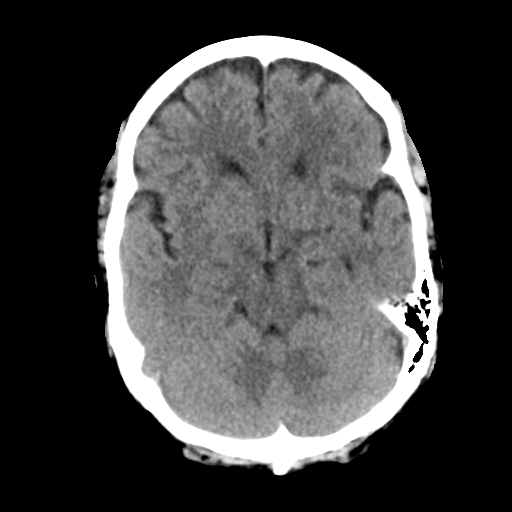
[im 12/28  brain]
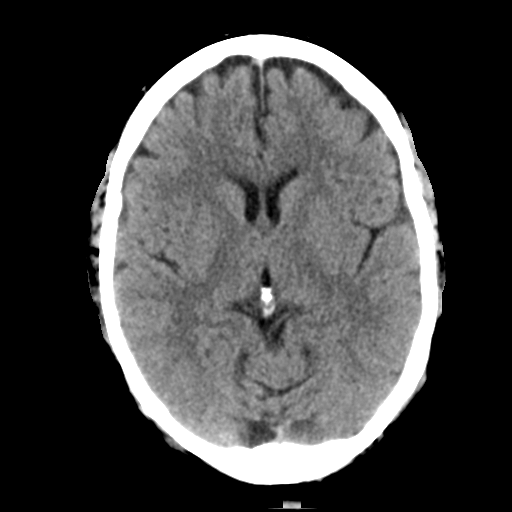
[im 15/28  brain]
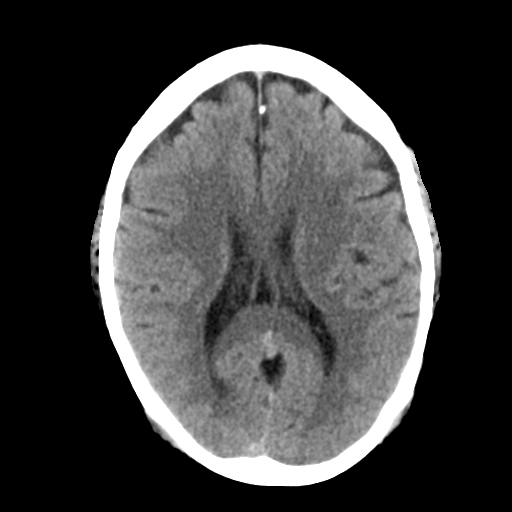
[im 15/28  bone]
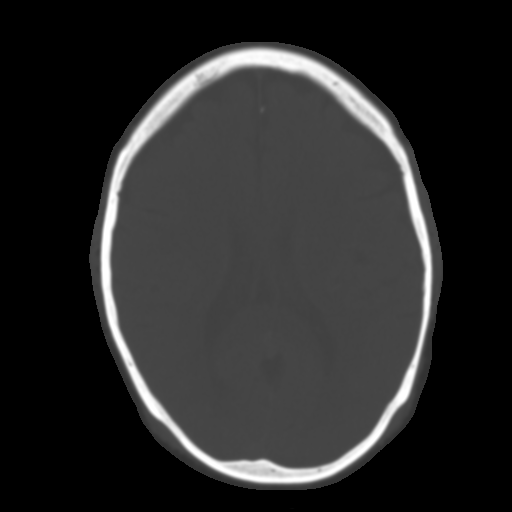
[im 17/28  brain]
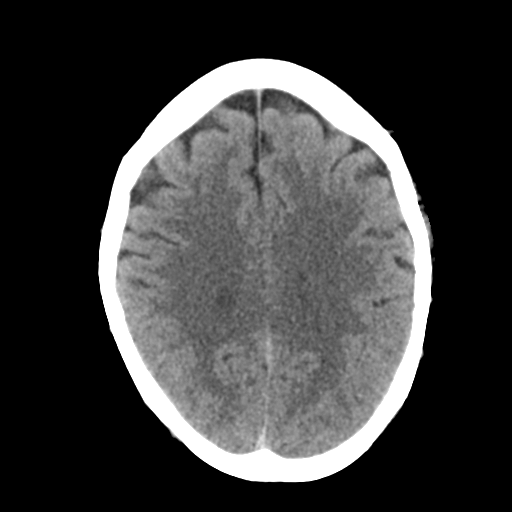
[im 20/28  brain]
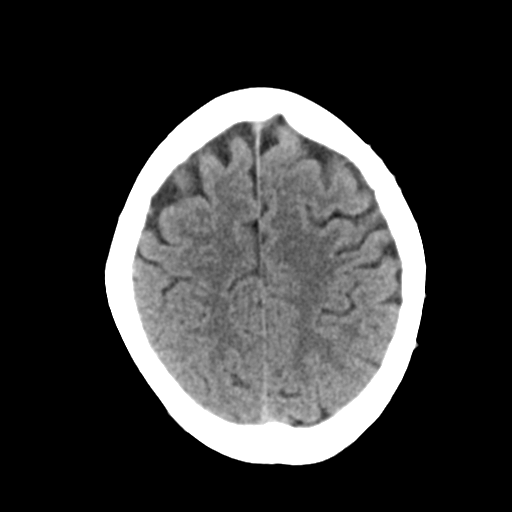
[im 23/28  brain]
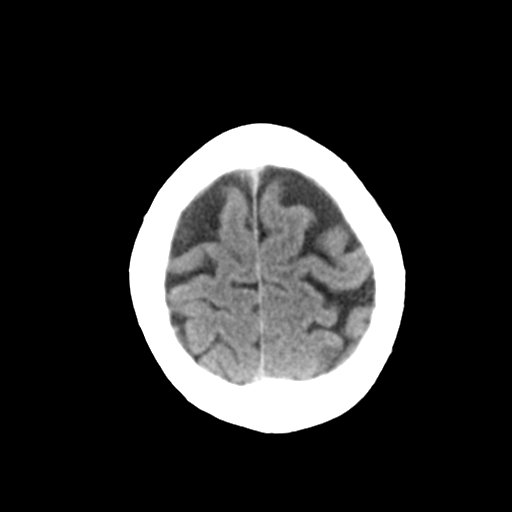
[im 26/28  brain]
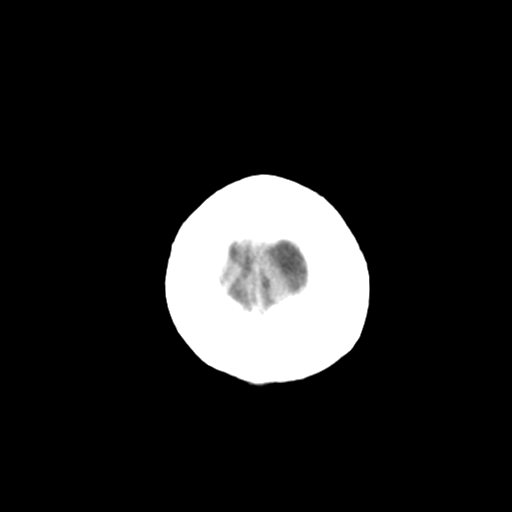
[im 26/28  bone]
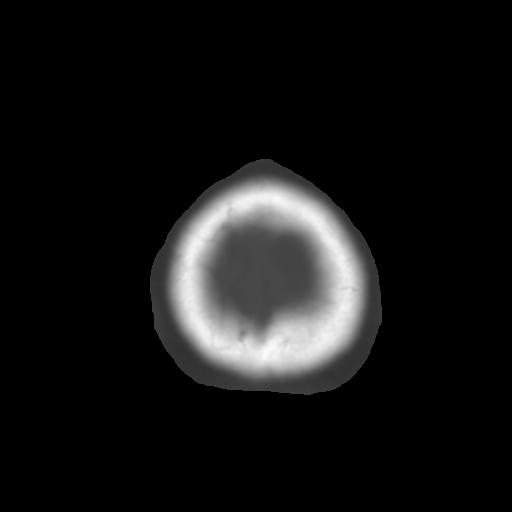

[Series 4: coronal soft tissue · coronal · 0.27mm/px · 3 of 58 slices shown]
[im 20/58  brain]
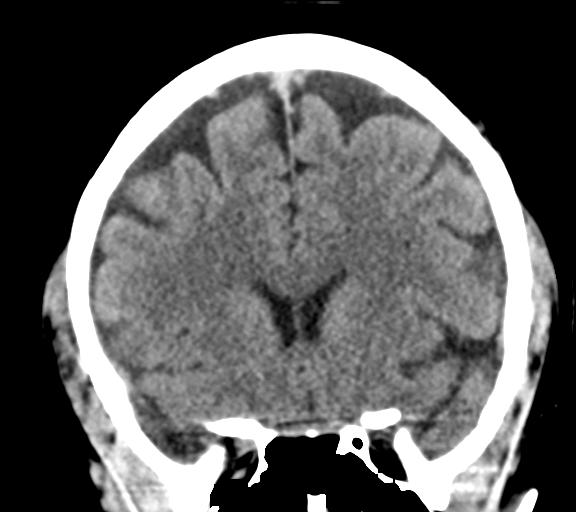
[im 26/58  brain]
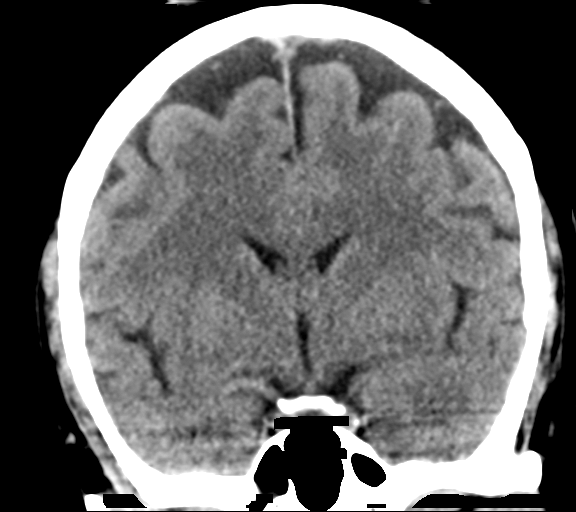
[im 32/58  brain]
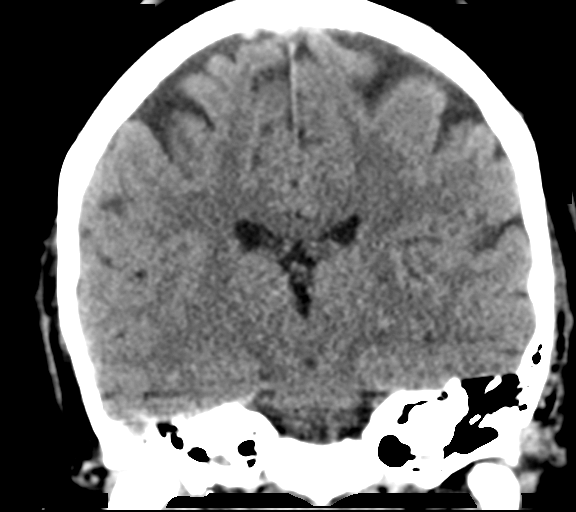

[Series 5: sagittal soft tissue · sagittal · 0.27mm/px · 3 of 50 slices shown]
[im 17/50  brain]
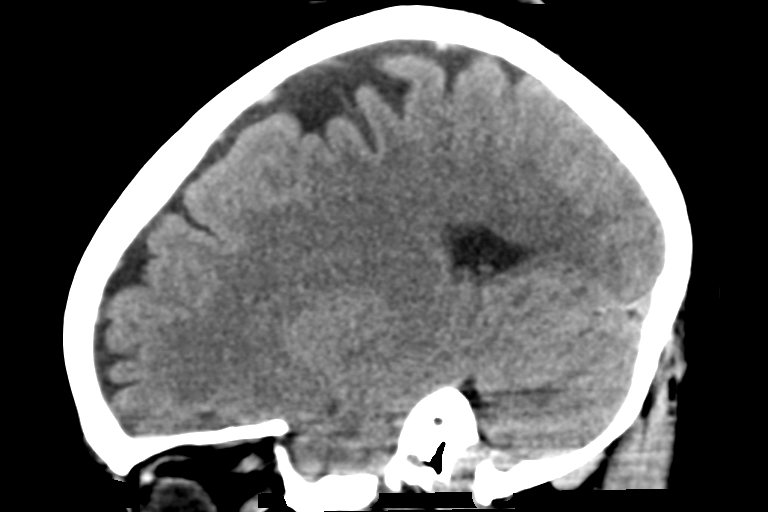
[im 25/50  brain]
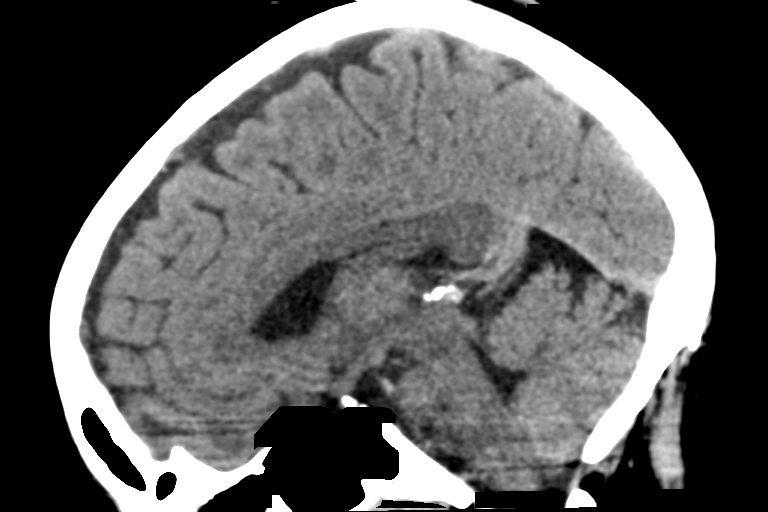
[im 33/50  brain]
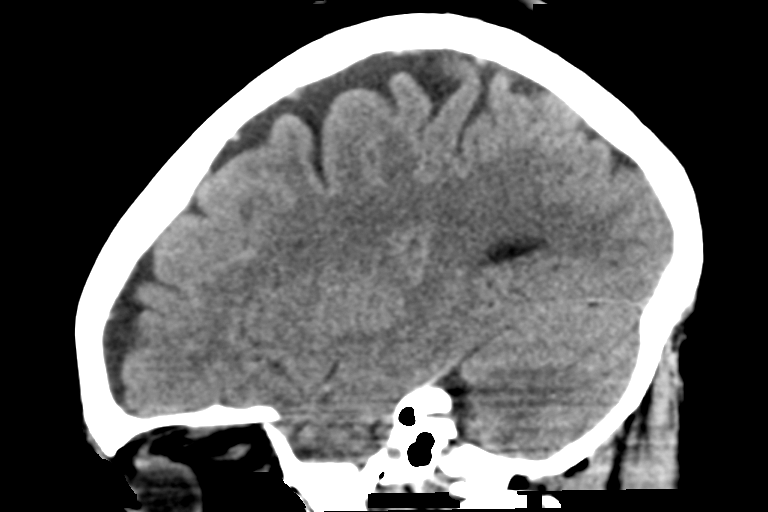

[15 of 45 positions shown; findings below may reference images not displayed]

FINDINGS: Mild right sphenoid sinus mucosal thickening. Other visible sinuses
and mastoid air cells are clear. No acute osseous abnormality
identified. Negative orbit and scalp soft tissues. Calcified
atherosclerosis at the skull base.

Cerebral volume is within normal limits for age. No midline shift,
ventriculomegaly, mass effect, evidence of mass lesion, intracranial
hemorrhage or evidence of cortically based acute infarction.
Gray-white matter differentiation is within normal limits throughout
the brain. No suspicious intracranial vascular hyperdensity.

ASPECTS

Total score (0-10 with 10 being normal): 10
IMPRESSION: 1. Normal for age noncontrast CT appearance of the brain.
2. ASPECTS score 10
3. Study discussed by telephone with Dr. MADILYN EISELE on 11/01/2015

## 2017-12-24 IMAGING — CT CT ABD-PELV W/O CM
2 of 4 series · 16 of 46 positions shown, 18 images · non-contrast
Comparison: None.

CLINICAL DATA: History of hypertension, sarcoidosis. Started
experiencing headache, dizziness, left-sided facial tingling,
photophobia. Left lower quadrant abdominal pain similar to
diverticulitis in the past. Symptoms started at [DATE] a.m..

EXAM:
CT ABDOMEN AND PELVIS WITHOUT CONTRAST
TECHNIQUE: Multidetector CT imaging of the abdomen and pelvis was performed
following the standard protocol without IV contrast.

[Series 2: axial st · axial · 0.83mm/px · z∈[-849,-429]mm · 13 of 92 slices shown, 15 images]
[im 4/92  soft-tissue]
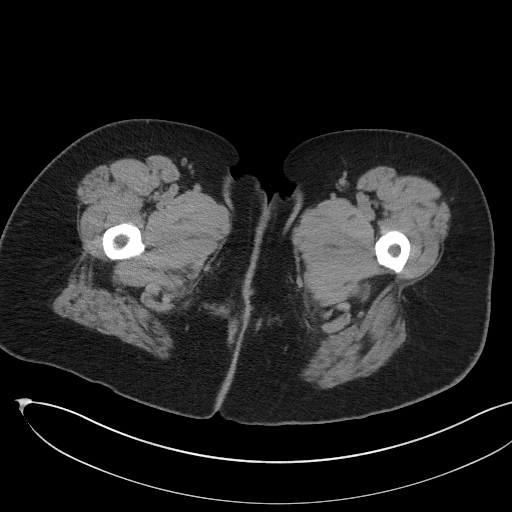
[im 4/92  bone]
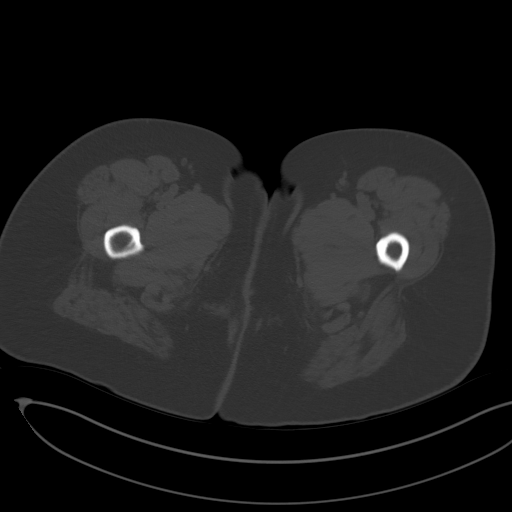
[im 11/92  soft-tissue]
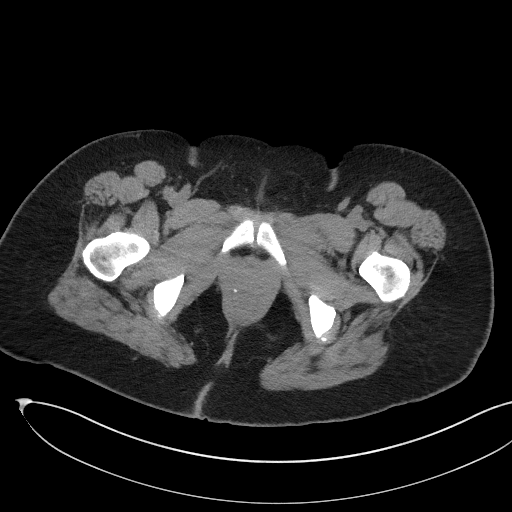
[im 19/92  soft-tissue]
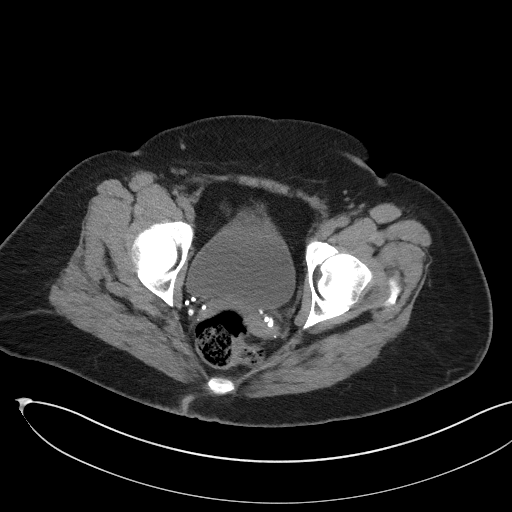
[im 26/92  soft-tissue]
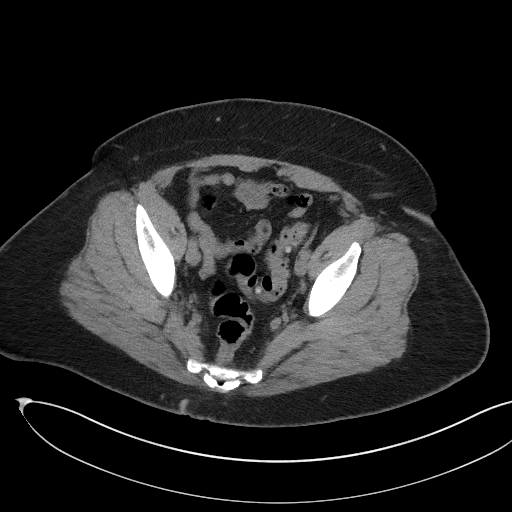
[im 33/92  soft-tissue]
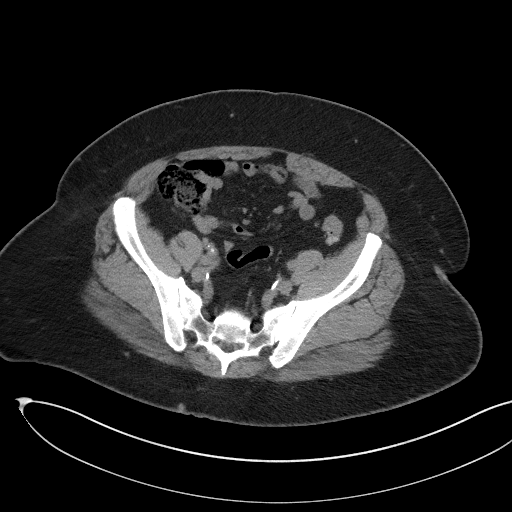
[im 41/92  soft-tissue]
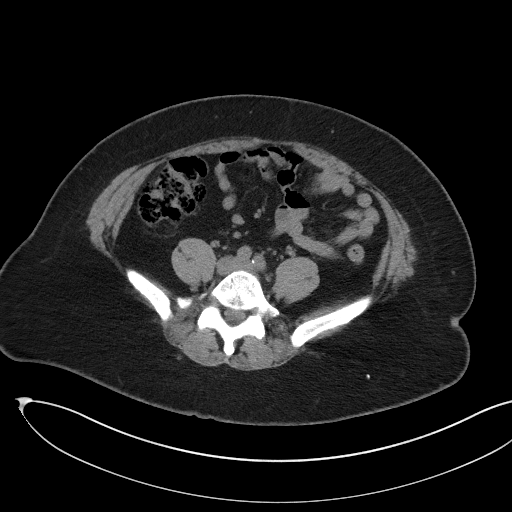
[im 48/92  soft-tissue]
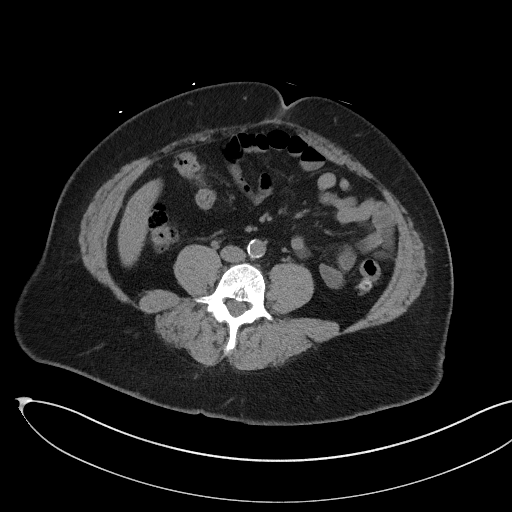
[im 51/92  soft-tissue]
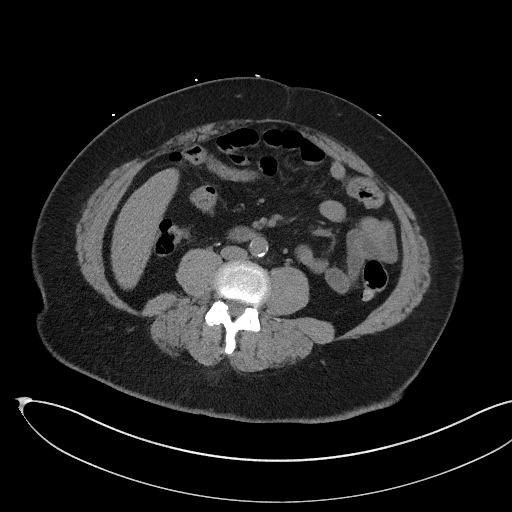
[im 59/92  soft-tissue]
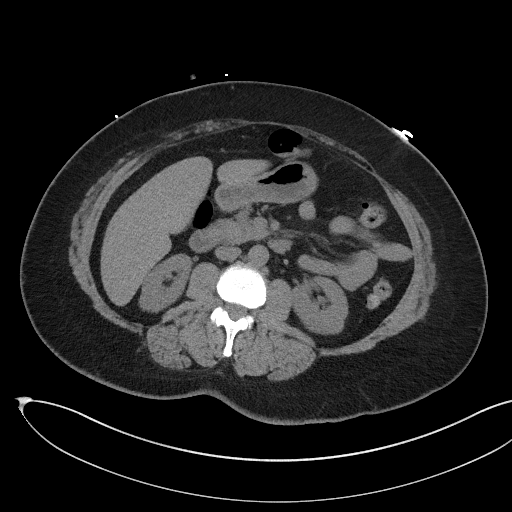
[im 59/92  bone]
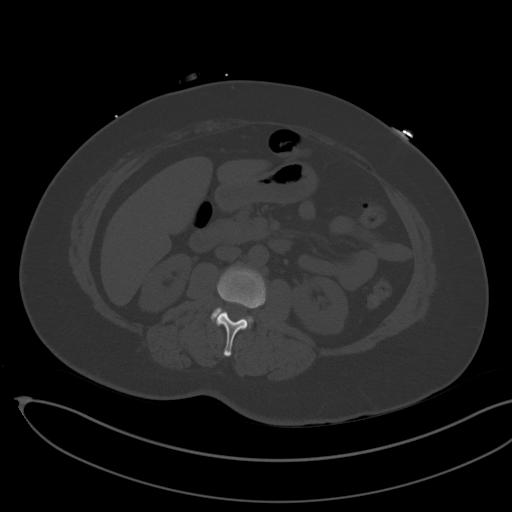
[im 66/92  soft-tissue]
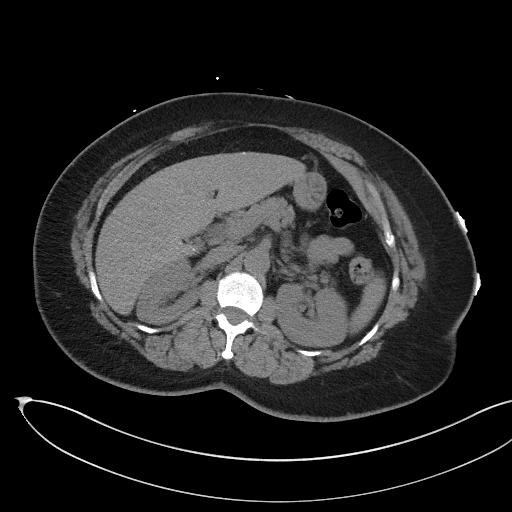
[im 73/92  soft-tissue]
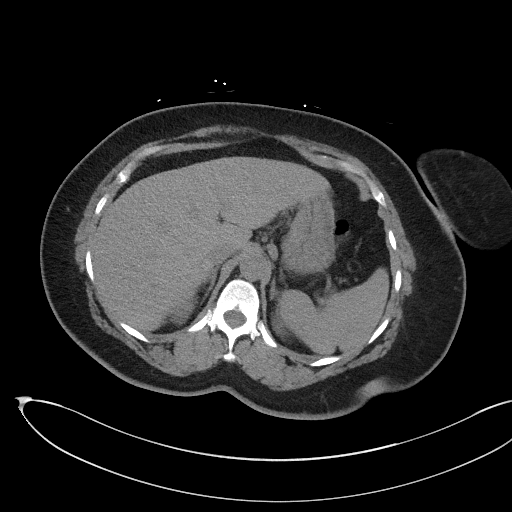
[im 81/92  soft-tissue]
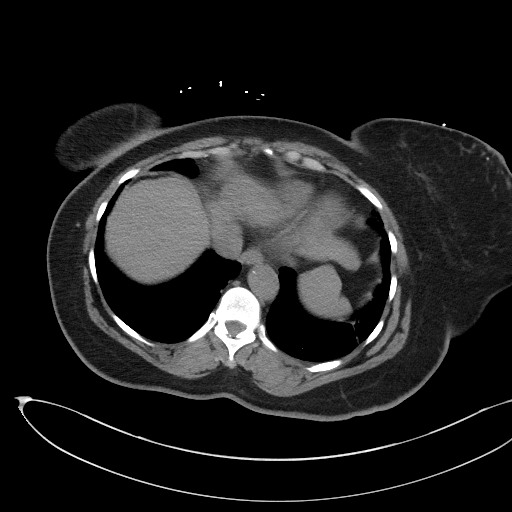
[im 88/92  soft-tissue]
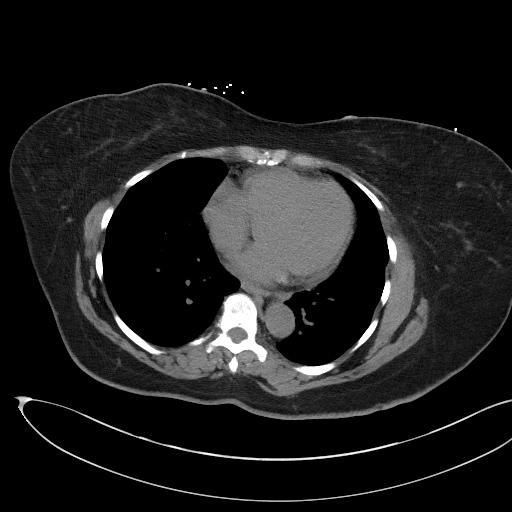

[Series 5: coronal st · coronal · 0.86mm/px · 3 of 90 slices shown]
[im 30/90  soft-tissue]
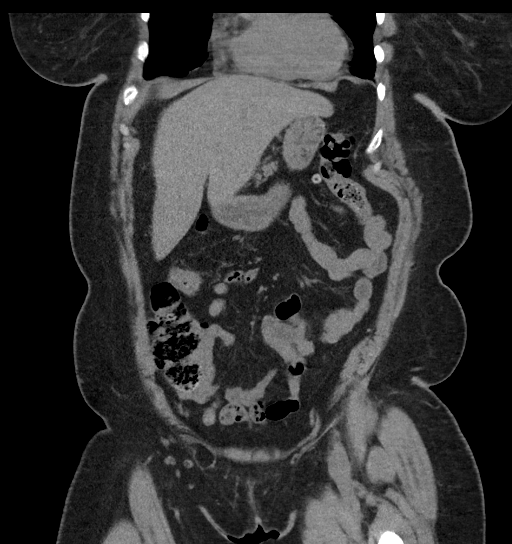
[im 40/90  soft-tissue]
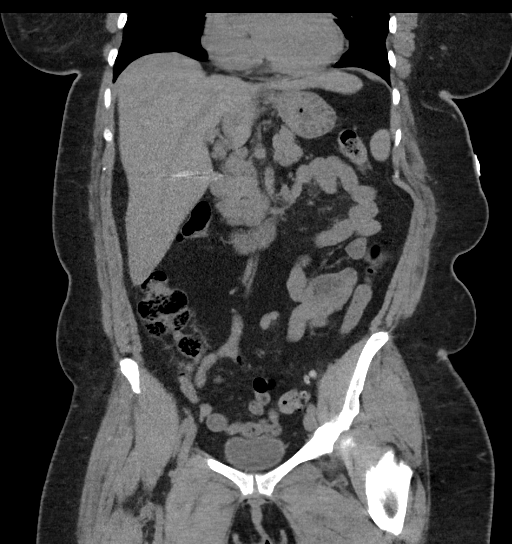
[im 50/90  soft-tissue]
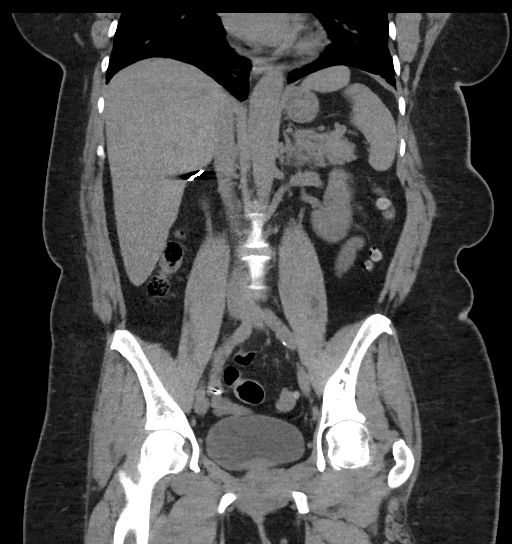

[16 of 46 positions shown; findings below may reference images not displayed]

FINDINGS: Lower chest: Small calcified granuloma is identified in the right
middle lobe. There is mild bibasilar atelectasis or fibrosis at the
lung bases.

Hepatobiliary: Small calcified granulomata are identified within the
liver. No suspicious liver lesions are present. Gallbladder is
surgically absent.

Pancreas: Within normal noncontrast appearance.

Spleen: Small calcified splenic granulomata.  Otherwise, normal.

Renal/Adrenal: Adrenal glands are normal in appearance. No
intrarenal calculi. No hydronephrosis.

Gastrointestinal tract: The stomach and small bowel loops are normal
in appearance. Numerous colonic diverticula are present. No acute
diverticulitis. Status post appendectomy.

Reproductive/Pelvis: Status post hysterectomy. No adnexal mass. No
free pelvic fluid.

Vascular/Lymphatic: There is atherosclerotic calcification of the
abdominal aorta. No aneurysm. Small, nonspecific lymph nodes are
identified in the upper abdomen, largest identified in the
gastrohepatic ligament measuring 1.5 x 0.8 cm.

Musculoskeletal/Abdominal wall: Abdominal wall is unremarkable.
Visualized osseous structures have a normal appearance.

Other: none
IMPRESSION: 1.  No evidence for acute  abnormality.
2. Calcified granulomata within the liver, spleen.
3. Fibrotic changes at the lung bases.
4. Nonspecific small upper abdominal lymph nodes.
5. Colonic diverticulosis without evidence for acute diverticulitis.
6. Status post appendectomy, hysterectomy, cholecystectomy.

## 2018-09-22 DIAGNOSIS — R000 Tachycardia, unspecified: Secondary | ICD-10-CM

## 2018-09-22 HISTORY — PX: OTHER PROCEDURE: U1053

## 2018-09-22 HISTORY — DX: Tachycardia, unspecified: R00.0

## 2018-11-07 IMAGING — CR DG CHEST 2V
1 series · 2 of 2 positions shown · non-contrast
Comparison: 11/01/2015

CLINICAL DATA: Right upper anterior chest pain radiating to right
arm several months. Some shortness-of-breath.

EXAM:
CHEST  2 VIEW

[Series 1: dg chest 2 view · 0.14mm/px · 2 of 2 slices shown]
[im 1/2]
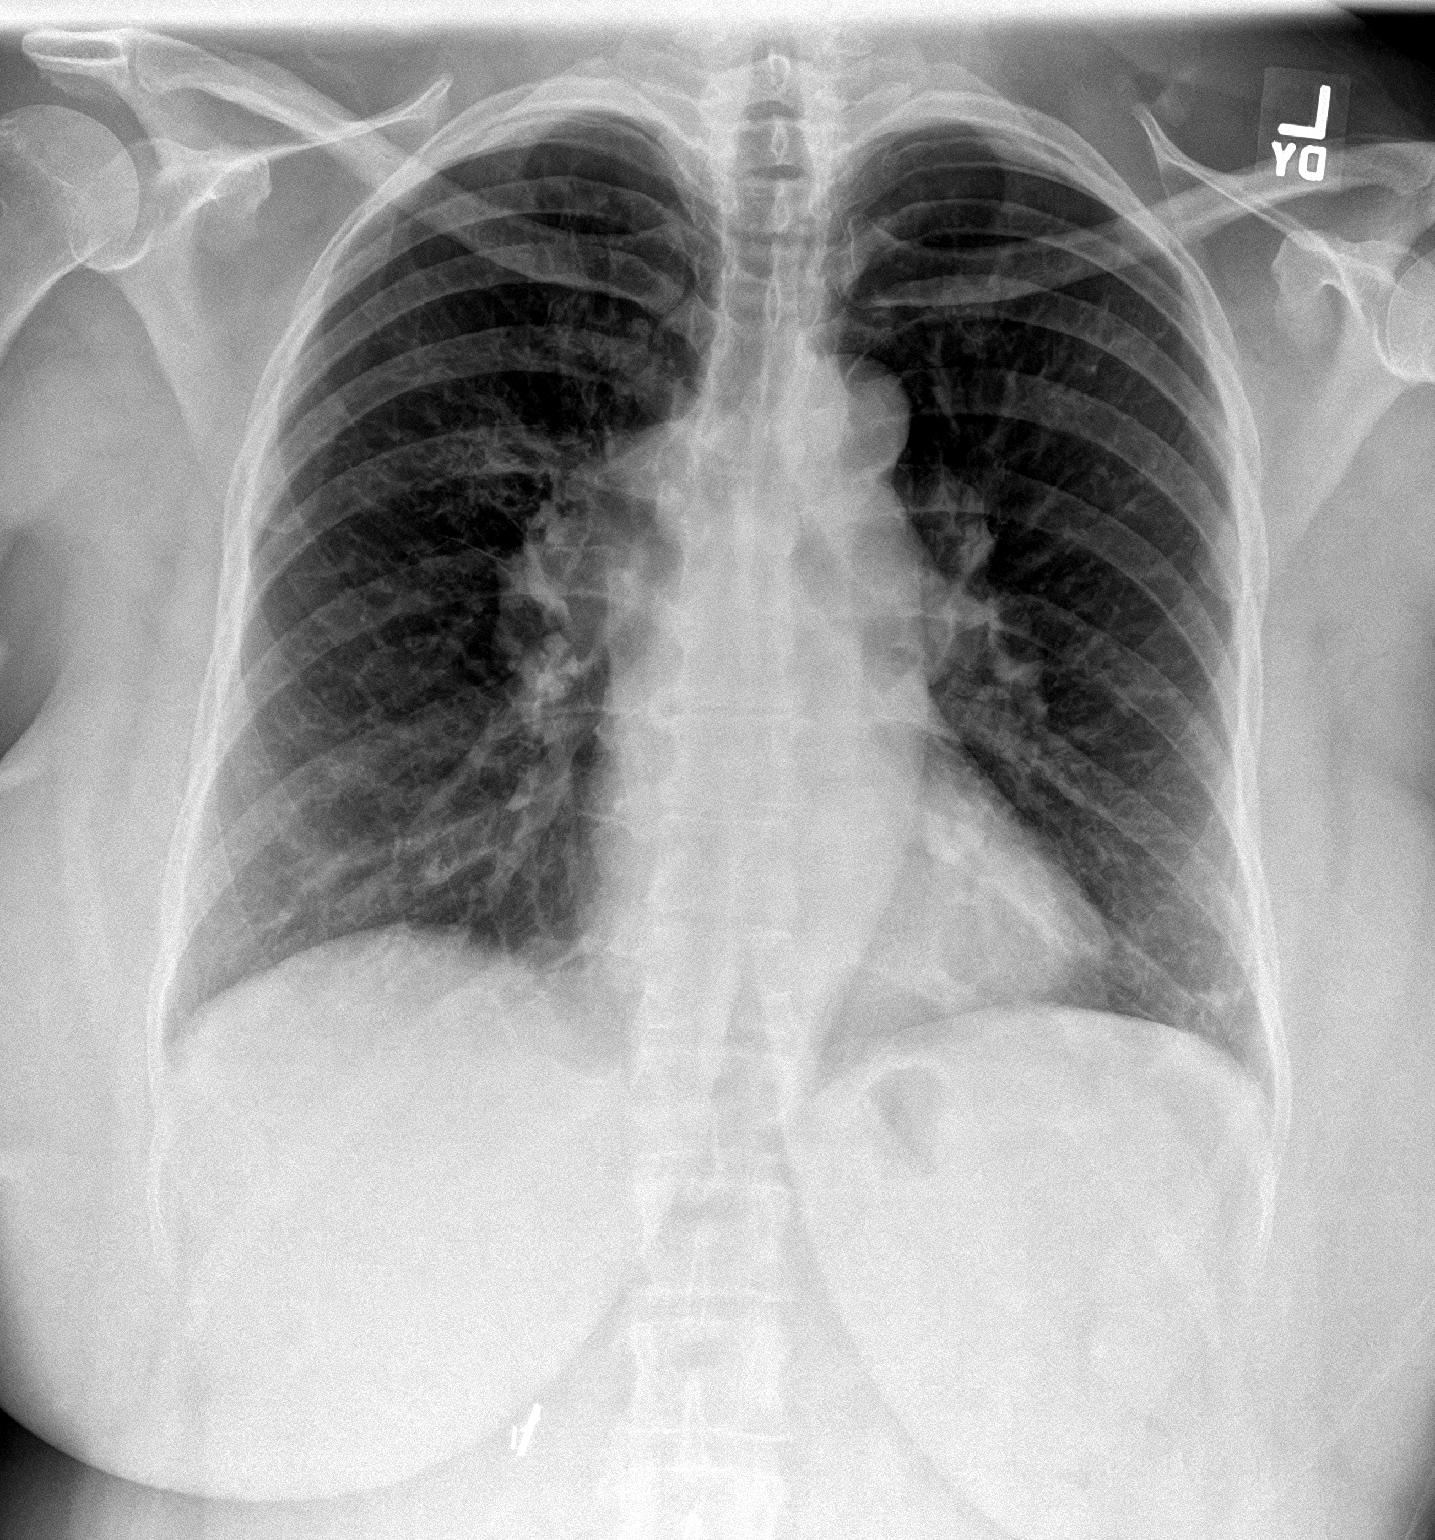
[im 2/2]
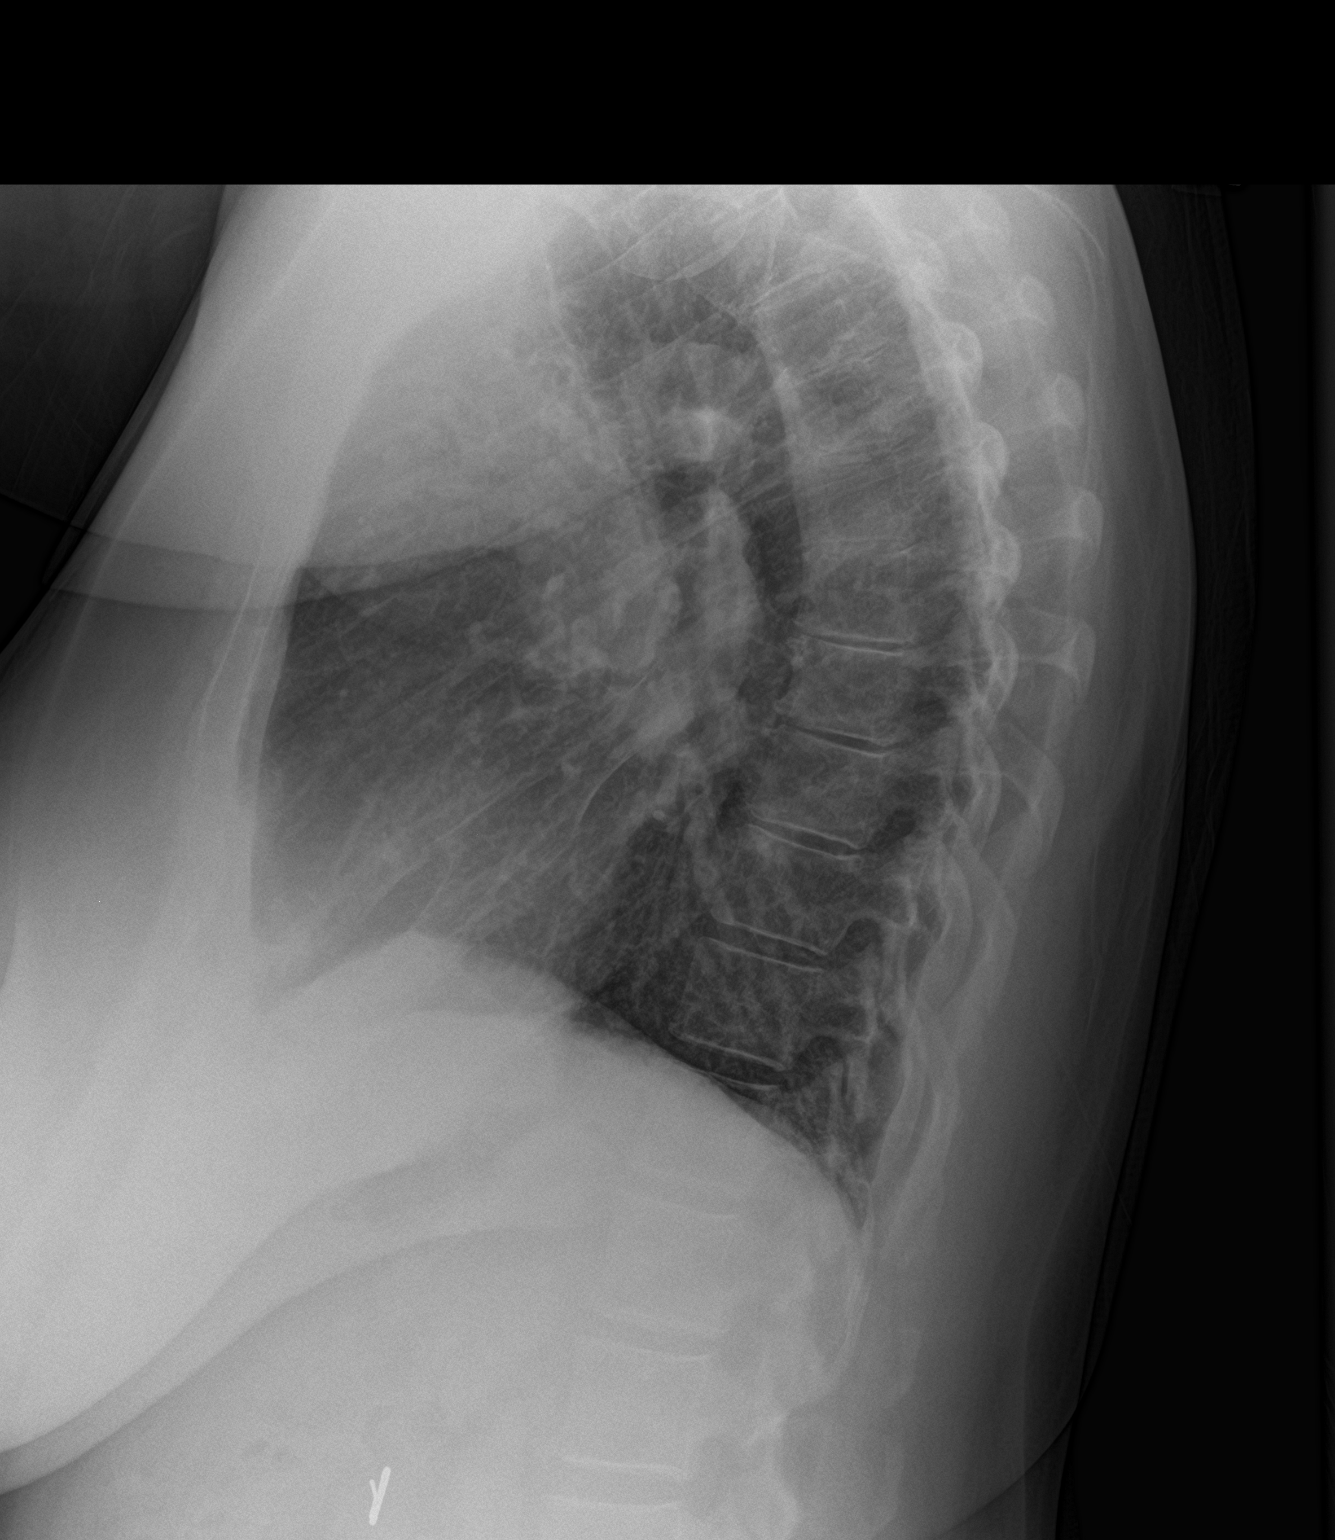

[2 of 2 positions shown; findings below may reference images not displayed]

FINDINGS: Lungs are adequately inflated without focal airspace consolidation
or effusion. Cardiac silhouette is within normal. Increased density
over the right suprahilar region and AP window without significant
change likely representing adenopathy in this patient with known
sarcoidosis. Minimal degenerate change of the spine.
IMPRESSION: No acute cardiopulmonary disease.

Suggestion of bilateral hilar/ mediastinal adenopathy without
significant change likely due to known sarcoidosis.

## 2019-05-26 ENCOUNTER — Telehealth: Payer: Self-pay | Admitting: Cardiovascular Disease

## 2019-05-26 NOTE — Telephone Encounter (Signed)
3 attempts to schedule fu appt from recall list.   Deleting recall.   

## 2020-03-30 ENCOUNTER — Telehealth (INDEPENDENT_AMBULATORY_CARE_PROVIDER_SITE_OTHER): Payer: Self-pay

## 2020-03-30 NOTE — Telephone Encounter (Signed)
Pt is calling to schedule appt with Pulmonary. Pt will be moving from [place] to [facility] in February and needs to establish care with Pulmonologist. Agent assisted with registration and inform pt of review process for scheduling. Agent provide pt with fax number to clinic. Pt will have pulmonologist in [place] fax over referral with medical records.   Noting as FYI.

## 2020-05-02 ENCOUNTER — Telehealth (INDEPENDENT_AMBULATORY_CARE_PROVIDER_SITE_OTHER): Payer: Self-pay | Admitting: Student in an Organized Health Care Education/Training Program

## 2020-05-02 NOTE — Telephone Encounter (Signed)
New Patient Pre-Visit Planning    Future Appointments   Date Time Provider [place]   07/09/2020 11:20 AM [name], MD [facility]       Mychart offered: Yes    Agenda Setting:    1) Np Est Care        Preferred Pharmacy:No Pharmacies Listed    Previous Medical Provider/Facility:    Name/Facility: [facility]cker. Jacinto Halim, MD   Address: [address], [place] [address]   Phone: [phone]

## 2020-05-09 ENCOUNTER — Encounter (INDEPENDENT_AMBULATORY_CARE_PROVIDER_SITE_OTHER): Payer: Self-pay

## 2020-05-09 DIAGNOSIS — R059 Cough, unspecified: Secondary | ICD-10-CM

## 2020-05-09 DIAGNOSIS — D869 Sarcoidosis, unspecified: Secondary | ICD-10-CM

## 2020-05-09 DIAGNOSIS — G4733 Obstructive sleep apnea (adult) (pediatric): Secondary | ICD-10-CM

## 2020-05-09 DIAGNOSIS — J9611 Chronic respiratory failure with hypoxia: Secondary | ICD-10-CM

## 2020-05-10 ENCOUNTER — Encounter (INDEPENDENT_AMBULATORY_CARE_PROVIDER_SITE_OTHER): Payer: Self-pay | Admitting: Hospital

## 2020-05-10 NOTE — Telephone Encounter (Addendum)
Pt called back to schedule appt for ILD however there is no availability. Pt says she had Stents removed on 04/30/20 and would like to see someone asap because she is not feeling well. Please assist, thank you

## 2020-05-10 NOTE — Telephone Encounter (Signed)
Attempted to contact patient with no success. Left detailed VM for patient to call back and schedule New ALD  appointment.    CC: please assist when call back occurs.Thank you!

## 2020-05-12 ENCOUNTER — Emergency Department
Admission: EM | Admit: 2020-05-12 | Discharge: 2020-05-13 | Disposition: A | Discharge status: Home / Self Care | Payer: Medicare Other | Attending: Emergency Medicine | Admitting: Emergency Medicine

## 2020-05-12 DIAGNOSIS — R1030 Lower abdominal pain, unspecified: Secondary | ICD-10-CM | POA: Insufficient documentation

## 2020-05-12 DIAGNOSIS — R112 Nausea with vomiting, unspecified: Secondary | ICD-10-CM | POA: Insufficient documentation

## 2020-05-12 DIAGNOSIS — I10 Essential (primary) hypertension: Secondary | ICD-10-CM | POA: Insufficient documentation

## 2020-05-12 DIAGNOSIS — E119 Type 2 diabetes mellitus without complications: Secondary | ICD-10-CM | POA: Insufficient documentation

## 2020-05-12 DIAGNOSIS — R1084 Generalized abdominal pain: Secondary | ICD-10-CM

## 2020-05-12 DIAGNOSIS — Z20822 Contact with and (suspected) exposure to covid-19: Secondary | ICD-10-CM | POA: Insufficient documentation

## 2020-05-12 DIAGNOSIS — R197 Diarrhea, unspecified: Secondary | ICD-10-CM

## 2020-05-12 DIAGNOSIS — Z882 Allergy status to sulfonamides status: Secondary | ICD-10-CM | POA: Insufficient documentation

## 2020-05-12 DIAGNOSIS — Z91041 Radiographic dye allergy status: Secondary | ICD-10-CM | POA: Insufficient documentation

## 2020-05-12 DIAGNOSIS — R059 Cough, unspecified: Secondary | ICD-10-CM | POA: Insufficient documentation

## 2020-05-12 DIAGNOSIS — E860 Dehydration: Secondary | ICD-10-CM | POA: Insufficient documentation

## 2020-05-12 DIAGNOSIS — D860 Sarcoidosis of lung: Secondary | ICD-10-CM | POA: Insufficient documentation

## 2020-05-12 DIAGNOSIS — Z7952 Long term (current) use of systemic steroids: Secondary | ICD-10-CM | POA: Insufficient documentation

## 2020-05-12 LAB — CBC WITH DIFF, BLOOD
ANC-Automated: 5.3 10*3/uL (ref 1.6–7.0)
Abs Basophils: 0 10*3/uL (ref <0.1)
Abs Eosinophils: 0.2 10*3/uL (ref 0.0–0.5)
Abs Lymphs: 1.4 10*3/uL (ref 0.8–3.1)
Abs Monos: 0.8 10*3/uL (ref 0.2–0.8)
Basophils: 0 %
Eosinophils: 3 %
Hct: 39.9 % (ref 34.0–45.0)
Hgb: 12.4 gm/dL (ref 11.2–15.7)
Imm Gran %: 1 % — ABNORMAL HIGH (ref <1)
Imm Gran Abs: 0.1 10*3/uL — ABNORMAL HIGH (ref <0.1)
Lymphocytes: 18 %
MCH: 25.9 pg — ABNORMAL LOW (ref 26.0–32.0)
MCHC: 31.1 g/dL — ABNORMAL LOW (ref 32.0–36.0)
MCV: 83.5 um3 (ref 79.0–95.0)
MPV: 9.1 fL — ABNORMAL LOW (ref 9.4–12.4)
Monocytes: 11 %
Plt Count: 325 10*3/uL (ref 140–370)
RBC: 4.78 10*6/uL (ref 3.90–5.20)
RDW: 14.7 % — ABNORMAL HIGH (ref 12.0–14.0)
Segs: 67 %
WBC: 7.9 10*3/uL (ref 4.0–10.0)

## 2020-05-12 LAB — INFLUENZA A/B & SARS-COV-2 PCR COMBO FOR RAPID RESPONSE LAB
Influenza A PCR, RRL: NOT DETECTED
Influenza B PCR, RRL: NOT DETECTED
SARS-CoV-2 PCR, RRL: NOT DETECTED

## 2020-05-12 LAB — COMPREHENSIVE METABOLIC PANEL, BLOOD
ALT (SGPT): 15 U/L (ref 0–33)
AST (SGOT): 16 U/L (ref 0–32)
Albumin: 4.1 g/dL (ref 3.5–5.2)
Alkaline Phos: 97 U/L (ref 35–140)
Anion Gap: 15 mmol/L (ref 7–15)
BUN: 12 mg/dL (ref 8–23)
Bicarbonate: 22 mmol/L (ref 22–29)
Bilirubin, Tot: 0.52 mg/dL (ref <1.2)
Calcium: 9.5 mg/dL (ref 8.5–10.6)
Chloride: 103 mmol/L (ref 98–107)
Creatinine: 0.94 mg/dL (ref 0.51–0.95)
GFR: >60 mL/min
Glucose: 101 mg/dL — ABNORMAL HIGH (ref 70–99)
Potassium: 3.5 mmol/L (ref 3.5–5.1)
Sodium: 140 mmol/L (ref 136–145)
Total Protein: 6.7 g/dL (ref 6.0–8.0)

## 2020-05-12 LAB — LIPASE, BLOOD: Lipase: 31 U/L (ref 13–60)

## 2020-05-12 LAB — MAGNESIUM, BLOOD: Magnesium: 2.1 mg/dL (ref 1.6–2.4)

## 2020-05-12 MED ORDER — LACTATED RINGERS IV SOLN
Freq: Once | INTRAVENOUS | Status: AC
Start: 2020-05-12 — End: 2020-05-12

## 2020-05-12 NOTE — ED Notes (Signed)
covid swab collected, walked to lab.

## 2020-05-13 ENCOUNTER — Emergency Department (HOSPITAL_BASED_OUTPATIENT_CLINIC_OR_DEPARTMENT_OTHER): Payer: Medicare Other

## 2020-05-13 DIAGNOSIS — R197 Diarrhea, unspecified: Secondary | ICD-10-CM

## 2020-05-13 DIAGNOSIS — R112 Nausea with vomiting, unspecified: Secondary | ICD-10-CM

## 2020-05-13 DIAGNOSIS — R1084 Generalized abdominal pain: Secondary | ICD-10-CM

## 2020-05-13 LAB — URINALYSIS WITH CULTURE REFLEX, WHEN INDICATED
Bilirubin: NEGATIVE
Blood: NEGATIVE
Leuk Esterase: 75 Leu/uL — AB
Nitrite: NEGATIVE
Specific Gravity: 1.03 (ref 1.002–1.030)
pH: 6 (ref 5.0–8.0)

## 2020-05-13 LAB — LACTATE, BLOOD: Lactate: 1 mmol/L (ref 0.5–2.0)

## 2020-05-13 MED ORDER — KETOROLAC TROMETHAMINE 30 MG/ML IJ SOLN
15.0000 mg | Freq: Once | INTRAMUSCULAR | Status: AC
Start: 2020-05-13 — End: 2020-05-13
  Administered 2020-05-13: 15 mg via INTRAVENOUS
  Filled 2020-05-13: qty 1

## 2020-05-13 MED ORDER — HYDROMORPHONE HCL 1 MG/ML IJ SOLN
0.5000 mg | Freq: Once | INTRAMUSCULAR | Status: AC
Start: 2020-05-13 — End: 2020-05-13
  Administered 2020-05-13: 0.5 mg via INTRAVENOUS
  Filled 2020-05-13: qty 1

## 2020-05-13 NOTE — ED Notes (Signed)
Report received from [place]

## 2020-05-13 NOTE — ED Provider Notes (Signed)
Emergency Department Note  [facility] electronic medical record reviewed for pertinent medical history.     Patient: Kimberly Curtis, MRN [id number], DOB May 22, 1955  The Date of Service for the Emergency Room encounter is 05/12/2020  8:10 PM     Nursing Triage Note :   Chief Complaint   Patient presents with    Diarrhea     cough, feeling generally unwell. pt will be waiting in the car [phone]) Irvan (spouse)       HPI :   Alera Quevedo is a 65 year old female who has PMHx significant for sarcoidosis (on chronic steroids), HTN, elevated BMI, and T2DM who presents with diarrhea and diffuse lower abdominal pain x 3 days. Endorses frequent watery nonbloody diarrhea. Endorses recent hospitalization less than one month ago for an evaluation for pulm stent placement. No recent antibiotic use or travel. Contrary to triage note, patient denies a new cough - she states she has a cough however it is her baseline cough from her sarcoidosis. Patient denies CP/SOB/hemoptysis. able to tolerate PO intake    Denies F/ N/ V/  hematochezia/ melana/ diaphoresis/ chills/ CP/ palpitations/ SOB/ cough/ hemoptysis/ congestion/ sore throat/ HA/ neck pain or stiffness/ photophobia/ blurry or double vision/ recent unexplained weight loss/ dysuria/ hematuria/ flank pain    Past Medical History : Reviewed    Past Surgical history : Reviewed    Family History: Reviewed    Social History:  Social History     Tobacco Use    Smoking status: Not on file    Smokeless tobacco: Not on file   Substance Use Topics    Alcohol use: Not on file    Drug use: Not on file     Housed  Recently moved here from [place] 5 days ago    Medications:   None       Allergies: Iodine [diagnostic x-ray materials] and Sulfa drugs    Review of Systems:   Complete review of all systems reviewed and negative unless otherwise noted in the HPI or above. This was done per my custom and practice for systems appropriate to the chief complaint in an emergency  department setting and varies depending on the quality of history that the patient is able to provide. History reviewed today as available from records and EPIC chart.      Physical Exam:   05/12/20  1751 05/13/20  0140   BP: 140/98 138/68   Pulse: 98 81   Resp: 18 18   Temp: 99.7 F (37.6 C)    SpO2: 100% 98%       Nursing note and vitals reviewed. Pt not hypoxic.  Gen: Patient is in moderate discomfort, chronically ill appearing, cooperative  HEENT: NC/AT, PERRL, EOMI, MMM, no conjunctival injection, b/l sclera anicteric, oropharynx clear, no LAD  Neck: Supple. No midline tenderness. No meningeal signs.  Cardiovascular: RRR no m/r/g, normal heart sounds, no JVD  Pulmonary/Chest: CTAB, no increased WOB, no respiratory distress, no wheezes/rhonchi/rales, no chest wall tenderness.   Abdominal: Soft. ND, +BS, no r/g, no HSM, +ttp RLQ/LLQ, neg murphy, neg McBurney, neg suprapubic, neg epigastric  Extr/MSK: Well perfused, distal pulses intact. No edema. No tenderness. FROM.  Back: No CVAT   Neuro: No evidence of facial droop, normal speech, mentation appropriate, steady gait.   Psychiatric: Normal affect. Mood not labile nor depressed.   Skin: No rashes, lesions, or wounds.   GU: No hernias, normal external genitalia     ED Course & Clinical Decision  Making:  65 year old  female with PMHx as listed in HPI presents diffuse lower abdominal pain and diarrhea x 3 days    Patient presents hemodynamically stable and with physical exam findings as stated above. Patient is in moderate discomfort, chronically ill appearing. Soft. ND, +BS, no r/g, no HSM, +ttp RLQ/LLQ, neg murphy, neg McBurney, neg suprapubic, neg epigastric        DDx broad including infectious diarrhea such as cdiff given recent hospitalization; Cannot fully r/o diverticulitis, appendicitis; history and physical with low clinical suspicion to suggest life threatening etiology of abdominal pain or surgical emergency, such as AAA, aortic dissection, acute  mesenteric ischemia, peritonitis, perforated viscous, cholecystitis, cholangitis, fulminant hepatic failure, hemorrhagic/necrotizing pancreatitis, appendicitis, diverticulitis, complete SBO, urinary tract infection, pulmonary presenting as upper abdominal pain, or torsion.    Initial workup broad at this point including basic labs, LFTs, and lipase. Given extent of abdominal pain of unclear etiology and prior medical history, will get CT abd/pelvis with contrast. Will continue to reassess patient with serial abdominal exams while in the ER and adjust workup based on initial labs and imaging. Symptomatic control as below including antiemetics. Disposition pending workup.     Orders Placed This Encounter   Procedures    CT Abdomen And Pelvis W/O Contrast    CMP    Lipase, Blood Green Plasma Separator Tube    CBC w/ Diff Lavender    Magnesium, Blood Green Plasma Separator Tube    Urinalysis with Culture Reflex, when indicated    Enteric Pathogens Nucleic Acid Test    Lactate, Blood - See Instructions     Medications   lactated ringers 1,000 mL IV bolus ( IntraVENOUS Stopped 05/12/20 2303)   HYDROmorphone (DILAUDID) injection 0.5 mg (0.5 mg IntraVENOUS Given 05/13/20 0150)         ED Course  ED Course as of 05/13/20 0224   Others' Documentation   Sun May 13, 2020   0119 S/o [place]  78F sarcoid w pulm involvement pw diarrhea, abd pain x 3 days, recent hospitalization inc risk for cdiff, on chronic steroids  UA dirty, no symptoms  [ ]  CT ap - noncon (anaphylaxis to contrast)  [ ]  PO reassess  Admit low threshold [JS]      ED Course User Index  [JS] Huey Romans, MD     - stool studies collected, pending  - UA with evidence of dehydration and possible contamination 2/2 GI infection. Patient continues to deny urinary symptoms   - Labs are reassuring - no leukocytosis, no anemia, no significant electrolyte abnormalities, normal renal and hepatic function  - Patient remains hemodynamically stable. Patient is  signed out to oncoming medical team at this time. See additional provider note for more information. Pending CT abd/pelvis and stool studies    ED Clinical Impression    ICD-10-CM ICD-9-CM    1. Diarrhea, unspecified type  R19.7 787.91    2. Generalized abdominal pain  R10.84 789.07          I have discussed my thought process and plan for the patient with the attending physician, Katherina Mires, MD.    Forestine Chute, MD  Emergency Medicine Resident, PGY3  [facility]      This note was created using voice recognition technology and may contain errors due to environmental circumstances.  The errors include but are not limited to grammatical errors, punctuation errors, spelling errors, etc.  Lauraine Rinne, MD  Resident  05/13/20 0231       Katherina Mires, MD  05/13/20 [phone]

## 2020-05-13 NOTE — ED Notes (Signed)
Pt to/from CT

## 2020-05-13 NOTE — ED Notes (Signed)
Report Given Ronel rn

## 2020-05-13 NOTE — ED MD Progress Note (Signed)
ED SIGN-OUT PROGRESS NOTE    Vitals trend during ED stay:  Vitals:    05/12/20 1751 05/13/20 0140   BP: 140/98 138/68   BP Location:  Left arm   BP Patient Position:  Semi-Fowlers   Pulse: 98 81   Resp: 18 18   Temp: 99.7 F (37.6 C)    SpO2: 100% 98%   Weight: 120.7 kg (266 lb)    Height: 5\' 1"  (1.549 m)        Triage Note:   Chief Complaint   Patient presents with    Diarrhea     cough, feeling generally unwell. pt will be waiting in the car [phone]) Irvan (spouse)       ED Course as of 05/13/20 0411   Jaymes Graff Documentation   Sun May 13, 2020   0409 Plan for DC with close outpatient follow up. Treatment pending stool study results   0353 CT neg   0119 S/o Kreshak  52F sarcoid w pulm involvement pw diarrhea, abd pain x 3 days, recent hospitalization inc risk for cdiff, on chronic steroids  UA dirty, no symptoms  [x]  CT ap - noncon (anaphylaxis to contrast)  [ ]  PO reassess  Admit low threshold       Dispo:  Home with PCP referral    Agustina Caroli, MD  Emergency Medicine PGY-4

## 2020-05-13 NOTE — ED Notes (Signed)
Patient discharged to home with family. Patient educated on discharge instructions. Advised to follow up with PCP. Advised to return to ER for any worsening symptoms or acute health concerns. Patient verbalized understanding

## 2020-05-14 LAB — URINE CULTURE

## 2020-05-18 LAB — BLOOD CULTURE
Blood Culture Result: NO GROWTH
Blood Culture Result: NO GROWTH

## 2020-07-09 ENCOUNTER — Encounter (INDEPENDENT_AMBULATORY_CARE_PROVIDER_SITE_OTHER): Payer: Self-pay | Admitting: Student in an Organized Health Care Education/Training Program

## 2020-07-09 ENCOUNTER — Telehealth (INDEPENDENT_AMBULATORY_CARE_PROVIDER_SITE_OTHER): Payer: Self-pay | Admitting: Pulmonary Medicine

## 2020-07-09 ENCOUNTER — Other Ambulatory Visit
Payer: Medicare Other | Attending: Student in an Organized Health Care Education/Training Program | Admitting: Student in an Organized Health Care Education/Training Program

## 2020-07-09 VITALS — BP 125/70 | HR 96 | Temp 98.7°F | Resp 20 | Ht 61.0 in | Wt 234.0 lb

## 2020-07-09 DIAGNOSIS — Z1212 Encounter for screening for malignant neoplasm of rectum: Secondary | ICD-10-CM

## 2020-07-09 DIAGNOSIS — D869 Sarcoidosis, unspecified: Secondary | ICD-10-CM | POA: Insufficient documentation

## 2020-07-09 DIAGNOSIS — Z1389 Encounter for screening for other disorder: Secondary | ICD-10-CM

## 2020-07-09 DIAGNOSIS — R110 Nausea: Secondary | ICD-10-CM

## 2020-07-09 DIAGNOSIS — L299 Pruritus, unspecified: Secondary | ICD-10-CM

## 2020-07-09 DIAGNOSIS — E782 Mixed hyperlipidemia: Secondary | ICD-10-CM

## 2020-07-09 DIAGNOSIS — Z13220 Encounter for screening for lipoid disorders: Secondary | ICD-10-CM

## 2020-07-09 DIAGNOSIS — E119 Type 2 diabetes mellitus without complications: Secondary | ICD-10-CM | POA: Insufficient documentation

## 2020-07-09 DIAGNOSIS — G8929 Other chronic pain: Secondary | ICD-10-CM | POA: Insufficient documentation

## 2020-07-09 DIAGNOSIS — Z1159 Encounter for screening for other viral diseases: Secondary | ICD-10-CM

## 2020-07-09 DIAGNOSIS — D649 Anemia, unspecified: Secondary | ICD-10-CM | POA: Insufficient documentation

## 2020-07-09 DIAGNOSIS — E559 Vitamin D deficiency, unspecified: Secondary | ICD-10-CM

## 2020-07-09 DIAGNOSIS — T7840XA Allergy, unspecified, initial encounter: Secondary | ICD-10-CM

## 2020-07-09 DIAGNOSIS — R296 Repeated falls: Secondary | ICD-10-CM

## 2020-07-09 DIAGNOSIS — Z794 Long term (current) use of insulin: Secondary | ICD-10-CM | POA: Insufficient documentation

## 2020-07-09 DIAGNOSIS — G894 Chronic pain syndrome: Secondary | ICD-10-CM

## 2020-07-09 DIAGNOSIS — M25562 Pain in left knee: Secondary | ICD-10-CM | POA: Insufficient documentation

## 2020-07-09 DIAGNOSIS — Z1339 Encounter for screening examination for other mental health and behavioral disorders: Secondary | ICD-10-CM

## 2020-07-09 DIAGNOSIS — Z23 Encounter for immunization: Secondary | ICD-10-CM

## 2020-07-09 DIAGNOSIS — Z1211 Encounter for screening for malignant neoplasm of colon: Secondary | ICD-10-CM

## 2020-07-09 DIAGNOSIS — Z0000 Encounter for general adult medical examination without abnormal findings: Secondary | ICD-10-CM | POA: Insufficient documentation

## 2020-07-09 DIAGNOSIS — Z1231 Encounter for screening mammogram for malignant neoplasm of breast: Secondary | ICD-10-CM

## 2020-07-09 DIAGNOSIS — K219 Gastro-esophageal reflux disease without esophagitis: Secondary | ICD-10-CM

## 2020-07-09 DIAGNOSIS — I10 Essential (primary) hypertension: Secondary | ICD-10-CM

## 2020-07-09 DIAGNOSIS — Z124 Encounter for screening for malignant neoplasm of cervix: Secondary | ICD-10-CM

## 2020-07-09 DIAGNOSIS — Z01419 Encounter for gynecological examination (general) (routine) without abnormal findings: Secondary | ICD-10-CM

## 2020-07-09 LAB — RANDOM URINE MICROALB/CREAT RATIO PANEL
Creatinine, Urine: 131 mg/dL (ref 29–226)
MALB/CR Ratio Random: UNDETERMINED mcg/mgCr (ref <30)
Microalbumin, Urine: <1.2 mg/dL (ref <2.0)

## 2020-07-09 LAB — LIPID(CHOL FRACT) PANEL, BLOOD
Cholesterol: 335 mg/dL — ABNORMAL HIGH (ref <200)
HDL-Cholesterol: 48 mg/dL
LDL-Chol (Calc): 229 mg/dL — ABNORMAL HIGH (ref <160)
Non-HDL Cholesterol: 287 mg/dL
Triglycerides: 288 mg/dL — ABNORMAL HIGH (ref 10–170)

## 2020-07-09 LAB — CBC WITH DIFF, BLOOD
ANC-Automated: 7 10*3/uL (ref 1.6–7.0)
Abs Basophils: 0.1 10*3/uL — ABNORMAL HIGH (ref <0.1)
Abs Eosinophils: 0.2 10*3/uL (ref 0.0–0.5)
Abs Lymphs: 2 10*3/uL (ref 0.8–3.1)
Abs Monos: 1 10*3/uL — ABNORMAL HIGH (ref 0.2–0.8)
Basophils: 1 %
Eosinophils: 2 %
Hct: 44 % (ref 34.0–45.0)
Hgb: 12.7 gm/dL (ref 11.2–15.7)
Imm Gran Abs: 0.1 10*3/uL — ABNORMAL HIGH (ref <0.1)
Lymphocytes: 19 %
MCH: 23.7 pg — ABNORMAL LOW (ref 26.0–32.0)
MCHC: 28.9 g/dL — ABNORMAL LOW (ref 32.0–36.0)
MCV: 82.1 um3 (ref 79.0–95.0)
MPV: 10.2 fL (ref 9.4–12.4)
Monocytes: 9 %
Plt Count: 395 10*3/uL — ABNORMAL HIGH (ref 140–370)
RBC: 5.36 10*6/uL — ABNORMAL HIGH (ref 3.90–5.20)
RDW: 16.5 % — ABNORMAL HIGH (ref 12.0–14.0)
Segs: 68 %
WBC: 10.3 10*3/uL — ABNORMAL HIGH (ref 4.0–10.0)

## 2020-07-09 LAB — COMPREHENSIVE METABOLIC PANEL, BLOOD
ALT (SGPT): 22 U/L (ref 0–33)
ALT (SGPT): 22 U/L (ref 0–33)
AST (SGOT): 21 U/L (ref 0–32)
Albumin: 4.6 g/dL (ref 3.5–5.2)
Alkaline Phos: 112 U/L — ABNORMAL HIGH (ref 35–104)
Anion Gap: 14 mmol/L (ref 7–15)
BUN: 11 mg/dL (ref 8–23)
Bicarbonate: 32 mmol/L — ABNORMAL HIGH (ref 22–29)
Bilirubin, Tot: 0.36 mg/dL (ref <1.2)
Calcium: 10.4 mg/dL (ref 8.5–10.6)
Chloride: 98 mmol/L (ref 98–107)
Creatinine: 1.07 mg/dL — ABNORMAL HIGH (ref 0.51–0.95)
GFR: >60 mL/min
Glucose: 183 mg/dL — ABNORMAL HIGH (ref 70–99)
Potassium: 3.7 mmol/L (ref 3.5–5.1)
Sodium: 144 mmol/L (ref 136–145)
Total Protein: 6.9 g/dL (ref 6.0–8.0)
eGFR Based on CKD-EPI 2021 Equation: 59 mL/min

## 2020-07-09 LAB — HEPATITIS C AB, BLOOD: Hepatitis C Ab: NONREACTIVE

## 2020-07-09 LAB — GLYCOSYLATED HGB(A1C), BLOOD: Glyco Hgb (A1C): 8.4 % — ABNORMAL HIGH (ref 4.8–5.8)

## 2020-07-09 LAB — TSH, BLOOD: TSH: 1.29 u[IU]/mL (ref 0.27–4.20)

## 2020-07-09 MED ORDER — EMPAGLIFLOZIN 25 MG PO TABS
25.0000 mg | ORAL_TABLET | Freq: Every day | ORAL | 1 refills | Status: DC
Start: 2020-07-09 — End: 2020-10-12

## 2020-07-09 MED ORDER — HYDROXYZINE HCL 50 MG OR TABS
50.00 mg | ORAL_TABLET | ORAL | Status: DC
Start: 2020-05-02 — End: 2020-07-09

## 2020-07-09 MED ORDER — AMITRIPTYLINE HCL 50 MG OR TABS
50.0000 mg | ORAL_TABLET | Freq: Every evening | ORAL | 1 refills | Status: DC
Start: 2020-07-09 — End: 2020-10-12

## 2020-07-09 MED ORDER — BACLOFEN 5 MG PO TABS
5.0000 mg | ORAL_TABLET | Freq: Two times a day (BID) | ORAL | 2 refills | Status: DC
Start: 2020-07-09 — End: 2020-09-18

## 2020-07-09 MED ORDER — LANTUS SOLOSTAR 100 UNIT/ML SC SOLN
48.00 [IU] | PEN_INJECTOR | Freq: Every evening | SUBCUTANEOUS | Status: DC
Start: ? — End: 2020-07-09

## 2020-07-09 MED ORDER — TECHLITE PEN NEEDLES 32G X 4 MM MISC
0 refills | Status: DC
Start: 2020-07-09 — End: 2020-10-04

## 2020-07-09 MED ORDER — ONDANSETRON 8 MG OR TBDP
8.00 mg | ORAL_TABLET | Freq: Three times a day (TID) | ORAL | Status: DC | PRN
Start: ? — End: 2020-07-09

## 2020-07-09 MED ORDER — FEXOFENADINE HCL 180 MG OR TABS
180.00 mg | ORAL_TABLET | Freq: Every day | ORAL | Status: DC
Start: ? — End: 2020-07-09

## 2020-07-09 MED ORDER — MONTELUKAST SODIUM 10 MG OR TABS
10.0000 mg | ORAL_TABLET | Freq: Every evening | ORAL | 1 refills | Status: DC
Start: 2020-07-09 — End: 2021-02-05

## 2020-07-09 MED ORDER — LOSARTAN POTASSIUM 100 MG OR TABS
100.0000 mg | ORAL_TABLET | Freq: Every day | ORAL | 1 refills | Status: DC
Start: 2020-07-09 — End: 2020-10-12

## 2020-07-09 MED ORDER — NOVOLOG FLEXPEN 100 UNIT/ML SC SOPN
20.0000 [IU] | PEN_INJECTOR | Freq: Three times a day (TID) | SUBCUTANEOUS | 2 refills | Status: DC | PRN
Start: 2020-07-09 — End: 2020-11-12

## 2020-07-09 MED ORDER — FAMOTIDINE 40 MG OR TABS
40.0000 mg | ORAL_TABLET | Freq: Every day | ORAL | 1 refills | Status: DC
Start: 2020-07-09 — End: 2020-11-12

## 2020-07-09 MED ORDER — ASPIRIN 81 MG OR TBEC
81.0000 mg | DELAYED_RELEASE_TABLET | Freq: Every day | ORAL | 1 refills | Status: DC
Start: 2020-07-09 — End: 2020-11-12

## 2020-07-09 MED ORDER — OXYCODONE-ACETAMINOPHEN 10-325 MG OR TABS
1.0000 | ORAL_TABLET | Freq: Three times a day (TID) | ORAL | 0 refills | Status: DC | PRN
Start: 2020-07-09 — End: 2020-09-25

## 2020-07-09 MED ORDER — HYDROCODONE-CHLORPHENIRAMINE PO
5.00 mL | ORAL | Status: DC
Start: ? — End: 2021-01-14

## 2020-07-09 MED ORDER — ALBUTEROL SULFATE 108 (90 BASE) MCG/ACT IN AEPB: 1.00 | INHALATION_SPRAY | RESPIRATORY_TRACT | Status: AC | PRN

## 2020-07-09 MED ORDER — PANTOPRAZOLE SODIUM 40 MG OR TBEC
40.0000 mg | DELAYED_RELEASE_TABLET | Freq: Every day | ORAL | 1 refills | Status: DC
Start: 2020-07-09 — End: 2021-02-05

## 2020-07-09 MED ORDER — TECHLITE PEN NEEDLES 32G X 4 MM MISC
Status: DC
Start: 2020-04-18 — End: 2020-07-09

## 2020-07-09 MED ORDER — EMPAGLIFLOZIN 25 MG PO TABS
25.00 mg | ORAL_TABLET | Freq: Every day | ORAL | Status: DC
Start: ? — End: 2020-07-09

## 2020-07-09 MED ORDER — HYDROXYZINE HCL 50 MG OR TABS
50.0000 mg | ORAL_TABLET | Freq: Two times a day (BID) | ORAL | 2 refills | Status: DC | PRN
Start: 2020-07-09 — End: 2020-08-21

## 2020-07-09 MED ORDER — ASPIRIN 81 MG OR TBEC
81.00 mg | DELAYED_RELEASE_TABLET | Freq: Every day | ORAL | Status: DC
Start: ? — End: 2020-07-09

## 2020-07-09 MED ORDER — ONDANSETRON 8 MG OR TBDP
8.0000 mg | ORAL_TABLET | Freq: Three times a day (TID) | ORAL | 0 refills | Status: DC | PRN
Start: 2020-07-09 — End: 2020-11-09

## 2020-07-09 MED ORDER — OXYCODONE-ACETAMINOPHEN 10-325 MG OR TABS
ORAL_TABLET | ORAL | Status: DC
Start: 2020-04-20 — End: 2020-07-09

## 2020-07-09 MED ORDER — NOVOLOG FLEXPEN 100 UNIT/ML SC SOPN
20.00 [IU] | PEN_INJECTOR | Freq: Three times a day (TID) | SUBCUTANEOUS | Status: DC | PRN
Start: 2020-04-18 — End: 2020-07-09

## 2020-07-09 MED ORDER — FOLIC ACID 1 MG OR TABS
1.0000 mg | ORAL_TABLET | Freq: Every day | ORAL | 1 refills | Status: DC
Start: 2020-07-09 — End: 2020-10-12

## 2020-07-09 MED ORDER — LANTUS SOLOSTAR 100 UNIT/ML SC SOLN
48.0000 [IU] | PEN_INJECTOR | Freq: Every evening | SUBCUTANEOUS | 2 refills | Status: DC
Start: 2020-07-09 — End: 2021-06-14

## 2020-07-09 MED ORDER — FEXOFENADINE HCL 180 MG OR TABS
180.0000 mg | ORAL_TABLET | Freq: Every day | ORAL | 1 refills | Status: DC
Start: 2020-07-09 — End: 2022-10-08

## 2020-07-09 MED ORDER — ALBUTEROL SULFATE (2.5 MG/3ML) 0.083% IN NEBU
2.5000 mg | INHALATION_SOLUTION | RESPIRATORY_TRACT | 2 refills | Status: DC | PRN
Start: 2020-07-09 — End: 2022-08-07

## 2020-07-09 MED ORDER — FOLIC ACID 1 MG OR TABS
1.00 mg | ORAL_TABLET | Freq: Every day | ORAL | Status: DC
Start: ? — End: 2020-07-09

## 2020-07-09 MED ORDER — PANTOPRAZOLE SODIUM 40 MG OR TBEC
40.00 mg | DELAYED_RELEASE_TABLET | Freq: Every day | ORAL | Status: DC
Start: ? — End: 2020-07-09

## 2020-07-09 MED ORDER — MONTELUKAST SODIUM 10 MG OR TABS
10.00 mg | ORAL_TABLET | Freq: Every evening | ORAL | Status: DC
Start: ? — End: 2020-07-09

## 2020-07-09 MED ORDER — PREDNISONE 10 MG OR TABS
10.0000 mg | ORAL_TABLET | Freq: Every day | ORAL | 1 refills | Status: DC
Start: 2020-07-09 — End: 2021-05-21

## 2020-07-09 MED ORDER — PREGABALIN 75 MG OR CAPS
75.0000 mg | ORAL_CAPSULE | Freq: Two times a day (BID) | ORAL | 2 refills | Status: DC
Start: 2020-07-09 — End: 2020-09-18

## 2020-07-09 MED ORDER — PREGABALIN 75 MG OR CAPS
75.00 mg | ORAL_CAPSULE | Freq: Two times a day (BID) | ORAL | Status: DC
Start: 2020-05-02 — End: 2020-07-09

## 2020-07-09 MED ORDER — BACLOFEN 5 MG PO TABS
5.00 mg | ORAL_TABLET | Freq: Two times a day (BID) | ORAL | Status: DC
Start: ? — End: 2020-07-09

## 2020-07-09 MED ORDER — ALBUTEROL SULFATE (2.5 MG/3ML) 0.083% IN NEBU
INHALATION_SOLUTION | RESPIRATORY_TRACT | Status: DC
Start: 2020-04-18 — End: 2020-07-09

## 2020-07-09 MED ORDER — FUROSEMIDE 20 MG OR TABS
20.0000 mg | ORAL_TABLET | Freq: Two times a day (BID) | ORAL | 3 refills | Status: DC
Start: 2020-07-09 — End: 2020-10-12

## 2020-07-09 MED ORDER — AMITRIPTYLINE HCL 50 MG OR TABS
50.00 mg | ORAL_TABLET | Freq: Every evening | ORAL | Status: DC
Start: ? — End: 2020-07-09

## 2020-07-09 MED ORDER — PREDNISONE 10 MG OR TABS
10.00 mg | ORAL_TABLET | Freq: Every day | ORAL | Status: DC
Start: ? — End: 2020-07-09

## 2020-07-09 MED ORDER — LOSARTAN POTASSIUM 100 MG OR TABS
100.00 mg | ORAL_TABLET | Freq: Every day | ORAL | Status: DC
Start: 2020-04-17 — End: 2020-07-09

## 2020-07-09 MED ORDER — FAMOTIDINE 40 MG OR TABS
40.00 mg | ORAL_TABLET | Freq: Every day | ORAL | Status: DC
Start: ? — End: 2020-07-09

## 2020-07-09 MED ORDER — FUROSEMIDE 20 MG OR TABS
20.00 mg | ORAL_TABLET | Freq: Two times a day (BID) | ORAL | Status: DC
Start: ? — End: 2020-07-09

## 2020-07-09 MED ORDER — NALOXONE HCL 4 MG/0.1ML NA LIQD: 1.00 | Freq: Once | NASAL | Status: AC | PRN

## 2020-07-09 MED ORDER — FLUTICASONE-UMECLIDIN-VILANT 100-62.5-25 MCG/INH IN AEPB: 1.00 | INHALATION_SPRAY | Freq: Every day | RESPIRATORY_TRACT | Status: AC

## 2020-07-09 NOTE — Telephone Encounter (Signed)
Appointment is scheduled for 07/19/20 2PM.    Is patient post COVID? NO If Yes, schedule appointment > 14 days after illness    Screening Scripting:  Do you or have you had in the last 24 hours any COVID related symptoms NO  Have you knowingly been exposed to anyone having COVID symptoms int he last 14 days? NO  Have you traveled outside of the [place] in the last 14 days? NO  If YES to screening questions, scheduling first available appointment.  If scheduled F2F, send encounter to [place] or [place]    Did you inform patient of visitor policy? (1 Visitor is allowed for all in person visits) YES

## 2020-07-09 NOTE — Assessment & Plan Note (Addendum)
Chronic pain, OA of knees/back, sarcoid pain  Stopped Tramadol. Has Narcan at home, never used.   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

## 2020-07-09 NOTE — Progress Notes (Signed)
Internal Medicine - Establish Care       Chief Complaint   Patient presents with    Establish Care     Abd pain under ribs            Subjective:     Kimberly Curtis is a 65 year old female here to establish care.    History:  Sarcoidosis c/b frequent PNA/bronchitis s/p bronchial stent-on chronic steroids, Prednisone 10 mg daily, Albuterol neb PRN (once per week), MDI uses more frequently, montelukast 10 mg daily, Trelegy daily. Had bronchial stents recently taken out on 04/30/2020 in [place]. Last CT scan 04/2020.   Chronic cough causing B/l abd/thoracic pain-intermittent  T2DM w/ peripheral neuropathies, occasional blurry vision-last A1c ~8% (01/2020). On Lantus 48, Novolog 20 TID, ISS, Jardiance 25 mg daily. Last eye exam >10 years ago.   HTN-on Losartan 100, Lasix 20 BID. Checks BP at home, 120/70s  HLD-previously on Atorvastatin, stopped as she ran out. Takes ASA 81  GERD-Protonix 40 mg daily, Pepcid 40 daily  Obesity, BMI 44  Chronic pain, OA of knees/back, sarcoid pain-on Baclofen 5, Lyrica 75, Amitriptyline 50 mg, Percocet 10 (takes 2-3 doses daily). Stopped Tramadol. Has Narcan at home, never used. Seeing pain specialist in [place] 180 daily  Itching-Hydroxyzine 50 mg PRN  Constipation-BM every 1-3 days, has had more diarrhea since moving to [place].  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 Has 4 children. Stopped tobacco in 1996 (10 pack year history). Denies alcohol use.     Assessment and Plan:     Problem List Items Addressed This Visit        Cardiovascular    Essential hypertension    Current Assessment & Plan     BP within goal, <130/80  Checks BP at home, 120/70s average  -Cont on Losartan 100, Lasix 20 BID           Relevant Medications    furosemide (LASIX)  20 MG tablet    losartan (COZAAR) 100 MG tablet    Mixed hyperlipidemia    Current Assessment & Plan     -check lipids           Relevant Medications    aspirin 81 MG EC tablet       GI and Hepatology    Gastroesophageal reflux disease, unspecified whether esophagitis present    Relevant Medications    famotidine (PEPCID) 40 MG tablet    pantoprazole (PROTONIX) 40 MG tablet    Nausea    Relevant Medications    ondansetron (ZOFRAN ODT) 8 MG disintegrating tablet       Endocrine    Type 2 diabetes mellitus without complication, with long-term current use of insulin (CMS-HCC) - Primary    Current Assessment & Plan     T2DM w/ peripheral neuropathies, occasional blurry vision-last A1c ~8% (01/2020).   On Lantus 48, Novolog 20 TID, ISS, Jardiance 25 mg daily.   Last eye exam >10 years ago.   -Discussed importance of BG management for prevention of micro/macrovascular complications.  -Discussed management of hypoglycemia.  -Continue current regimen  -Check labs/urine  -Cont ARB  -Referral to Endocrinology and CDE for further support  -Would benefit from GLP1 to aid in  weight loss  -Continue annual eye exams, referral placed  -MFT per [facility]   -F/u as planned, 1 month           Relevant Medications    empagliflozin (JARDIANCE) 25 mg tablet    insulin glargine (LANTUS SOLOSTAR) 100 units/mL injection pen    NOVOLOG FLEXPEN 100 UNIT/ML injection pen    TECHLITE PEN NEEDLES 32G X 4 MM MISC    Other Relevant Orders    Random Urine Microalb/Creat Ratio Panel    [place]    Consult/Referral to [facility]       Other    Sarcoidosis    Current Assessment & Plan     Sarcoidosis c/b frequent PNA/bronchitis s/p bronchial stent-on chronic steroids, Prednisone 10 mg daily, Albuterol neb PRN (once per week), MDI uses more frequently, montelukast 10 mg daily, Trelegy daily.   Had bronchial stents recently taken out on 04/30/2020 in [place]. Last CT scan 04/2020.   -Referral to [facility]  cl[facility]  -Pending outside records  -cont current regimen for now           Relevant Medications    albuterol (PROVENTIL) (2.5 MG/3ML) 0.083% nebulization    montelukast (SINGULAIR) 10 MG tablet    predniSONE (DELTASONE) 10 MG tablet    Other Relevant Orders    Pulmonary and Sleep Medicine Services    Chronic pain syndrome    Current Assessment & Plan     Chronic pain, OA of knees/back, sarcoid pain  Stopped Tramadol. Has Narcan at home, never used.   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  -Cont on Baclofen 5, Lyrica 75, Amitriptyline 50 mg, Percocet 10 (takes 2-3 doses daily)  -Meds reviewed and reconciled in office today  -CURES checked, no records from [place] as patient's previous care was in [place], [place]  -Physical medications reviewed in office today  -Pain contract, urine tox per NV  -Referral to Pain medicine for optimization  -Referral to PT  -pending outside records           Relevant Medications    amitriptyline (ELAVIL) 50 MG tablet    Baclofen 5 MG TABS    oxycodone-acetaminophen (PERCOCET) 10-325 MG tablet    pregabalin (LYRICA) 75 MG capsule    Other Relevant Orders    Consult/Referral to Pain Clinic    Consult to [facility] - Internal    Recurrent falls    Relevant Orders    Consult to [facility] - Internal    Chronic pain of both knees    Relevant Orders    Consult to [facility] - Internal    Itching    Relevant Medications    hydrOXYzine HCL (ATARAX) 50 MG tablet      Other Visit Diagnoses     Need for vaccination        Relevant Orders    TDAP Vaccine >7 YO, IM (Completed)    Laboratory exam ordered as part of routine general medical examination        Relevant Orders    Glycosylated Hgb(A1C), Blood Lavender    Comprehensive Metabolic Panel    CBC w/ Diff Lav[place]    TSH, Blood - See Instructions    Need for hepatitis C screening test        Relevant Orders    Hepatitis C Antibody    Screening for  hyperlipidemia        Relevant Orders     Lipid Panel Green Plasma Separator Tube    Pap smear for cervical cancer screening        Encounter for screening mammogram for breast cancer        Relevant Orders    Screening Mammogram With Digital Breast Tomosynthesis - Bilateral    Encounter for colorectal cancer screening        Relevant Orders    GI Endoscopy Procedure Service Request (Non-GI Use Only)    Screened negative for drug use        Screened negative for alcohol use        Well woman exam        Relevant Orders    [facility] Clinic    Allergy, initial encounter        Relevant Medications    fexofenadine (ALLEGRA) 536 MG tablet    folic acid (FOLVITE) 1 MG tablet    Vitamin D deficiency        Relevant Orders    Vitamin D, 25-OH Total Yellow serum separator tube          HCM  TDAP today  MAMMO ordered    Health Maintenance   Topic Date Due    Breast Cancer Screen  Never done    Diabetic Retinal Exam  Never done    Diabetic Foot Exam  Never done    Diabetes Urine Microalbumin  Never done    Cervical Cancer Screening  Never done    Pneumococcal Vaccine (1 of 2 - PPSV23) Never done    Diabetes A1c  Never done    LDL Monitoring  Never done    Hepatitis C Screening  Never done    Medicare Annual Wellness Visit  Never done    Shingles Vaccine (1 of 2) 07/09/2021 (Originally 09/20/2007)    COVID-19 Vaccine (1) 07/09/2021 (Originally 09/20/1960)    Influenza (Season Ended) 10/22/2020    PHQ9 Depression Monitoring doc flowsheet  11/08/2020    Colorectal Cancer Screening  08/23/2027    Tetanus (2 - Td or Tdap) 07/10/2030    Polio Vaccine  Aged Out    HPV Vaccine <= 26 Yrs  Aged Out    Meningococcal MCV4 Vaccine  Aged Out           Routine health care maintenance were dealt with today. Appropriate screening labs and studies were ordered, and preventative counseling was done.    All of the patients questions were answered and patient verbalized understanding and agreement of the above plan.   The past medical history,  surgical history, family history, social history, and medications were reviewed and updated.  I have reviewed recent medical encounters, lab data, imaging data, and procedures.    Outside records pending.    I personally spent 60 total minutes in face-to-face and non-face-to-face activities related to the patients visit today, excluding any separately reportable services/procedures.      History:     Current Outpatient Medications   Medication Sig Dispense Refill    albuterol (PROVENTIL) (2.5 MG/3ML) 0.083% nebulization 3 mL (2.5 mg) by Nebulization route every 4 hours as needed for Wheezing. 1 each 2    Albuterol Sulfate 108 (90 Base) MCG/ACT AEPB Inhale 1 puff by mouth as needed.      amitriptyline (ELAVIL) 50 MG tablet Take 1 tablet (50 mg) by mouth nightly. 90 tablet 1    aspirin 81 MG EC  tablet Take 1 tablet (81 mg) by mouth daily. 90 tablet 1    Baclofen 5 MG TABS Take 1 tablet (5 mg) by mouth in the morning and at bedtime. 60 tablet 2    empagliflozin (JARDIANCE) 25 mg tablet Take 1 tablet (25 mg) by mouth daily. 90 tablet 1    famotidine (PEPCID) 40 MG tablet Take 1 tablet (40 mg) by mouth daily. 90 tablet 1    fexofenadine (ALLEGRA) 180 MG tablet Take 1 tablet (180 mg) by mouth daily. 90 tablet 1    fluticasone-umeclidinium-vilanterol (TRELEGY ELLIPTA) 100-62.5-25 MCG/INH AEPB Inhale 1 puff by mouth daily.      folic acid (FOLVITE) 1 MG tablet Take 1 tablet (1 mg) by mouth daily. 90 tablet 1    furosemide (LASIX) 20 MG tablet Take 1 tablet (20 mg) by mouth 2 times daily. 60 tablet 3    HYDROCODONE-CHLORPHENIRAMINE PO 5 mL. Every 12 hours as needed for cough      hydrOXYzine HCL (ATARAX) 50 MG tablet Take 1 tablet (50 mg) by mouth 2 times daily as needed for Itching. Every 6 hours as need for itching 30 tablet 2    insulin glargine (LANTUS SOLOSTAR) 100 units/mL injection pen Inject 48 Units under the skin nightly. 15 mL 2    losartan (COZAAR) 100 MG tablet Take 1 tablet (100 mg) by mouth  daily. 90 tablet 1    montelukast (SINGULAIR) 10 MG tablet Take 1 tablet (10 mg) by mouth every evening. 90 tablet 1    naloxone (NARCAN) 4 mg/0.1 mL nasal spray Spray 1 spray into one nostril once as needed.      NOVOLOG FLEXPEN 100 UNIT/ML injection pen Inject 20 Units under the skin 3 times daily as needed for High Blood Sugar. AM: 20 units + sliding scale Mid day: 17 + sliding scale and PM 12 + Sliding scale 15 mL 2    ondansetron (ZOFRAN ODT) 8 MG disintegrating tablet Take 1 tablet (8 mg) on or under the tongue every 8 hours as needed for Nausea/Vomiting. 30 tablet 0    oxycodone-acetaminophen (PERCOCET) 10-325 MG tablet Take 1 tablet by mouth every 8 hours as needed for Severe Pain (Pain Score 7-10). 90 tablet 0    pantoprazole (PROTONIX) 40 MG tablet Take 1 tablet (40 mg) by mouth every morning (before breakfast). 90 tablet 1    predniSONE (DELTASONE) 10 MG tablet Take 1 tablet (10 mg) by mouth daily. 90 tablet 1    pregabalin (LYRICA) 75 MG capsule Take 1 capsule (75 mg) by mouth 2 times daily. 1 tablet AM and 2 tablets PM 60 capsule 2    TECHLITE PEN NEEDLES 32G X 4 MM MISC Use one pen needle with each insulin administration. 300 each 0     No current facility-administered medications for this visit.     Patient Active Problem List   Diagnosis    Type 2 diabetes mellitus without complication, with long-term current use of insulin (CMS-HCC)    Sarcoidosis    Chronic pain syndrome    Recurrent falls    Chronic pain of both knees    Gastroesophageal reflux disease, unspecified whether esophagitis present    Nausea    Essential hypertension    Itching    Mixed hyperlipidemia      Social History     Socioeconomic History    Marital status: Married   Tobacco Use    Smoking status: Former Smoker    Smokeless tobacco: Never Used  Substance and Sexual Activity    Alcohol use: Not Currently    Drug use: Never    Past Surgical History:   Procedure Laterality Date    CHOLECYSTECTOMY       HYSTERECTOMY      KNEE ARTHROSCOPY Left       Allergies   Allergen Reactions    Iodine [[employer]] Anaphylaxis    Sulfa Drugs Anaphylaxis    Family History   Problem Relation Name Age of Onset    Diabetes Mother      Heart Disease Mother      Kidney Disease Mother      Hypertension Father      Alcohol/Drug Father            Objective:     Vitals:    07/09/20 1122 07/09/20 1123 07/09/20 1218   BP: 125/70 124/72 125/70   BP Location: Right arm Left arm Right arm   BP Patient Position: Sitting Sitting Sitting   BP cuff size: Large Large Large   Pulse: 96     Resp: 20     Temp: 98.7 F (37.1 C)     TempSrc: Temporal     SpO2: 96%     Weight: 106.1 kg (234 lb)     Height: 5' 1"  (1.549 m)         PHYSICAL EXAMINATION   GEN - WDWN, NAD  HEENT - AT/NC, Conjunctiva clear. MMM, OP clear. Poor dentition  NECK - Supple, no thyroid mass  PULM - Scattered expiratory wheezes, normal WOB  CARDIAC - RRR, normal S1/S2, no m/r/g  GI - Soft, NT/ND, normal BS  EXT -  Trace pedal edema  NEURO -  Non-focal. EOMI, PERRL, face symmetric, no dysarthria.    PSYCH -  Mood and affect normal      Labs and Imaging:     Lab Results   Component Value Date    WBC 7.9 05/12/2020    RBC 4.78 05/12/2020    HGB 12.4 05/12/2020    HCT 39.9 05/12/2020    MCV 83.5 05/12/2020    MCHC 31.1 (L) 05/12/2020    RDW 14.7 (H) 05/12/2020    PLT 325 05/12/2020    MPV 9.1 (L) 05/12/2020       Lab Results   Component Value Date    BUN 12 05/12/2020    CREAT 0.94 05/12/2020    CL 103 05/12/2020    NA 140 05/12/2020    K 3.5 05/12/2020    [place] 9.5 05/12/2020    TBILI 0.52 05/12/2020    ALB 4.1 05/12/2020    TP 6.7 05/12/2020    AST 16 05/12/2020    ALK 97 05/12/2020    BICARB 22 05/12/2020    ALT 15 05/12/2020    GLU 101 (H) 05/12/2020       Lipids:  No results found for: CHOL, HDL, LDLCALC, TRIG, LDLDIRECT    Diabetes screen:No results found for: A1C    Thyroid:  No results found for: TSH      Followup:     Return in about 6 weeks (around  08/20/2020).    Medications at the end of this encounter:  Current Outpatient Medications   Medication Sig Dispense Refill    albuterol (PROVENTIL) (2.5 MG/3ML) 0.083% nebulization 3 mL (2.5 mg) by Nebulization route every 4 hours as needed for Wheezing. 1 each 2    Albuterol Sulfate 108 (90 Base) MCG/ACT AEPB Inhale 1 puff by  mouth as needed.      amitriptyline (ELAVIL) 50 MG tablet Take 1 tablet (50 mg) by mouth nightly. 90 tablet 1    aspirin 81 MG EC tablet Take 1 tablet (81 mg) by mouth daily. 90 tablet 1    Baclofen 5 MG TABS Take 1 tablet (5 mg) by mouth in the morning and at bedtime. 60 tablet 2    empagliflozin (JARDIANCE) 25 mg tablet Take 1 tablet (25 mg) by mouth daily. 90 tablet 1    famotidine (PEPCID) 40 MG tablet Take 1 tablet (40 mg) by mouth daily. 90 tablet 1    fexofenadine (ALLEGRA) 180 MG tablet Take 1 tablet (180 mg) by mouth daily. 90 tablet 1    fluticasone-umeclidinium-vilanterol (TRELEGY ELLIPTA) 100-62.5-25 MCG/INH AEPB Inhale 1 puff by mouth daily.      folic acid (FOLVITE) 1 MG tablet Take 1 tablet (1 mg) by mouth daily. 90 tablet 1    furosemide (LASIX) 20 MG tablet Take 1 tablet (20 mg) by mouth 2 times daily. 60 tablet 3    HYDROCODONE-CHLORPHENIRAMINE PO 5 mL. Every 12 hours as needed for cough      hydrOXYzine HCL (ATARAX) 50 MG tablet Take 1 tablet (50 mg) by mouth 2 times daily as needed for Itching. Every 6 hours as need for itching 30 tablet 2    insulin glargine (LANTUS SOLOSTAR) 100 units/mL injection pen Inject 48 Units under the skin nightly. 15 mL 2    losartan (COZAAR) 100 MG tablet Take 1 tablet (100 mg) by mouth daily. 90 tablet 1    montelukast (SINGULAIR) 10 MG tablet Take 1 tablet (10 mg) by mouth every evening. 90 tablet 1    naloxone (NARCAN) 4 mg/0.1 mL nasal spray Spray 1 spray into one nostril once as needed.      NOVOLOG FLEXPEN 100 UNIT/ML injection pen Inject 20 Units under the skin 3 times daily as needed for High Blood Sugar. AM: 20 units +  sliding scale Mid day: 17 + sliding scale and PM 12 + Sliding scale 15 mL 2    ondansetron (ZOFRAN ODT) 8 MG disintegrating tablet Take 1 tablet (8 mg) on or under the tongue every 8 hours as needed for Nausea/Vomiting. 30 tablet 0    oxycodone-acetaminophen (PERCOCET) 10-325 MG tablet Take 1 tablet by mouth every 8 hours as needed for Severe Pain (Pain Score 7-10). 90 tablet 0    pantoprazole (PROTONIX) 40 MG tablet Take 1 tablet (40 mg) by mouth every morning (before breakfast). 90 tablet 1    predniSONE (DELTASONE) 10 MG tablet Take 1 tablet (10 mg) by mouth daily. 90 tablet 1    pregabalin (LYRICA) 75 MG capsule Take 1 capsule (75 mg) by mouth 2 times daily. 1 tablet AM and 2 tablets PM 60 capsule 2    TECHLITE PEN NEEDLES 32G X 4 MM MISC Use one pen needle with each insulin administration. 300 each 0     No current facility-administered medications for this visit.             [name], MD  [facility]

## 2020-07-09 NOTE — Patient Instructions (Signed)
Diabetes and Diet    There is no one diet for all people with diabetes.  There is, however, a "recipe" for eating healthfully that is similar to recommendations for heart health, cancer prevention and weight management.    To successfully manage diabetes, you need to understand how foods and nutrition affect your body.  Food portions and food choices are important.   Carbohydrates, fat and protein need to be balanced to ensure blood sugar levels stay as stable as possible (This is particularly important for people with Type 1 diabetes.).    The keys to a healthy eating plan are:    · Eat meals and snacks regularly (at planned times).  · Eat about the same amount of food at each meal or snack.  · Choose healthful foods to support a healthy weight and heart.    Put Together a Plan    You need a registered dietitian nutritionist on your team who will work with you to put together an individualized eating plan that takes into account your food preferences, level of physical activity and lifestyle.    Your RDN will work with you and your physician to strike the right balance between your eating plan and any diabetes medications you take.    Plan Healthy Meals    Good health depends on eating a variety of foods that contain the right amounts  of carbohydrates, protein and healthy fats, as well as vitamins, minerals, fiber and water.   If you have diabetes, a healthy daily eating plan includes:    · Starchy foods including breads, cereals, pasta, rice, other whole grains and starchy vegetables such as beans, corn and peas  · Non-starchy vegetables including carrots, green beans and broccoli  · Fruits  · Lean meat, fish, poultry, low-fat cheese and tofu  · Fat-free or low-fat milk and yogurt  · Healthy fats such as plant-based oils and trans-fat-free spreads    The actual amounts of each food group depend on the number of calories you need, which, in turn, depends on your age, gender, size and activity level.  Together with  your RDN, you can develop an eating plan that is best for you.    Meal Plan Options: Food Lists and Carbohydrate Counting    Carbohydrates affect your blood sugar more than protein or fat.  As your eating plan is designed, portioning out foods high in carbohydrates will help control blood sugar levels.    Choose Your Foods: Food Lists for Diabetes    The food lists for diabetes planning uses food groups, like the ones listed abote: starchy foods, vegetables, fruits, meats, dairy and fat.   Within each food list are food choices that contain similar amounts of fat, protein and carbohydrates.   Because foods are divided this way, you are able to substitute one food choice for another within any one group.  For example, bread, cereal, rice and potatoes are all starch choices.  Your eating plan will specify a certain number of starch choices that you can have for a meal or snack.  You may then select any foods within the starch group that stay within the number of choices planned.  for each meal you will likely have food choices from at least three to four food lists.    Carbohydrate Counting    You may need to keep track of the amount of carbohydrates you eat and drink.  Your RDN will determine a specific amount of carbohydrates for each meal   or snack to ensure your blood sugar stays in good control.  Your job is to learn the number of carbohydrates in each food and drink measured in grams or carb choices, then keep to the planned number at each meal and snack.    Carbohydrate counting gives you wiggle room in terms of making food choices since one carb choice equals 15 grams carbohydrate.  However, to ensure you eat healthfully your focus should be on whole grains, fruits, vegetables, beans and low-fat milk.  Sweets should be saved as occasional treats.    If you have been diagnosed with diabetes, seek the expert advice of a registered dietitian nutritionist  to help you manage the disease while ensuring you get the  nutrients your body needs.

## 2020-07-09 NOTE — Assessment & Plan Note (Signed)
check lipids.   

## 2020-07-09 NOTE — Assessment & Plan Note (Signed)
BP within goal, <130/80  Checks BP at home, 120/70s average  -Cont on Losartan 100, Lasix 20 BID

## 2020-07-09 NOTE — Assessment & Plan Note (Signed)
Sarcoidosis c/b frequent PNA/bronchitis s/p bronchial stent-on chronic steroids, Prednisone 10 mg daily, Albuterol neb PRN (once per week), MDI uses more frequently, montelukast 10 mg daily, Trelegy daily.   Had bronchial stents recently taken out on 04/30/2020 in [place]. Last CT scan 04/2020.   -Referral to [place] clinic  -Pending outside records  -cont current regimen for now

## 2020-07-09 NOTE — Assessment & Plan Note (Addendum)
T2DM w/ peripheral neuropathies, occasional blurry vision-last A1c ~8% (01/2020).   On Lantus 48, Novolog 20 TID, ISS, Jardiance 25 mg daily.   Last eye exam >10 years ago.   -Discussed importance of BG management for prevention of micro/macrovascular complications.  -Discussed management of hypoglycemia.  -Continue current regimen  -Check labs/urine  -Cont ARB  -Referral to Endocrinology and CDE for further support  -Would benefit from GLP1 to aid in weight loss  -Continue annual eye exams, referral placed  -MFT per [facility]   -F/u as planned, 1 month

## 2020-07-10 ENCOUNTER — Encounter (INDEPENDENT_AMBULATORY_CARE_PROVIDER_SITE_OTHER): Payer: Self-pay | Admitting: Registered"

## 2020-07-10 ENCOUNTER — Encounter (HOSPITAL_COMMUNITY): Payer: Self-pay

## 2020-07-10 ENCOUNTER — Encounter (INDEPENDENT_AMBULATORY_CARE_PROVIDER_SITE_OTHER): Payer: Self-pay | Admitting: Hospital

## 2020-07-10 ENCOUNTER — Telehealth (INDEPENDENT_AMBULATORY_CARE_PROVIDER_SITE_OTHER): Payer: Self-pay

## 2020-07-10 NOTE — Telephone Encounter (Signed)
Question Answer   Requested Procedure 1-COLONOSCOPY ([id number])   Indications 1-Routine Colon [place] Sc[place] (>46YR, No Colonoscopy in past 5YRS)   Is GI Triage Required Yes = GI physician or nurse will review procedure request and sedation needs.   Urgency: Routine (Within 4 WKS or at Pt Convenience)   Provider: 1st Available GI Physician     Routing to Dr Stephens Shire to review- RN recommends procedure to be with MAC due to high BMI

## 2020-07-11 ENCOUNTER — Telehealth (INDEPENDENT_AMBULATORY_CARE_PROVIDER_SITE_OTHER): Payer: Self-pay | Admitting: Student in an Organized Health Care Education/Training Program

## 2020-07-11 NOTE — Telephone Encounter (Signed)
From: Kimberly Curtis  To: [name], MD  Sent: 07/09/2020 2:50 PM PDT  Subject: Handicap sticker     Dear Dr Jeannette How  I just left your office and I wanted to ask you about getting a handicap parking sign and if you can give me one   Thank you

## 2020-07-11 NOTE — Telephone Encounter (Signed)
Agree with assessment. Colonoscopy with MAC (BMI >40, chronic opiates, sarcoidosis on pred).  [place] for [place], [place] only.

## 2020-07-12 ENCOUNTER — Encounter (INDEPENDENT_AMBULATORY_CARE_PROVIDER_SITE_OTHER): Payer: Self-pay | Admitting: Student in an Organized Health Care Education/Training Program

## 2020-07-12 DIAGNOSIS — D649 Anemia, unspecified: Secondary | ICD-10-CM

## 2020-07-12 LAB — COMPREHENSIVE METABOLIC PANEL, BLOOD
AST (SGOT): 21 U/L (ref 0–32)
Albumin: 4.6 g/dL (ref 3.5–5.2)
Alkaline Phos: 112 U/L — ABNORMAL HIGH (ref 35–104)
BUN: 11 mg/dL (ref 8–23)
Bicarbonate: 32 mmol/L — ABNORMAL HIGH (ref 22–29)
Bilirubin, Tot: 0.36 mg/dL (ref <1.2)
Calcium: 10.4 mg/dL (ref 8.5–10.6)
Chloride: 98 mmol/L (ref 98–107)
Creatinine: 1.07 mg/dL — ABNORMAL HIGH (ref 0.51–0.95)
Glucose: 183 mg/dL — ABNORMAL HIGH (ref 70–99)
Potassium: 3.7 mmol/L (ref 3.5–5.1)
Sodium: 144 mmol/L (ref 136–145)
Total Protein: 6.9 g/dL (ref 6.0–8.0)

## 2020-07-12 LAB — IBC - IRON BINDING CAPACITY
Iron Saturation: 8 %
Iron: 29 ug/dL — ABNORMAL LOW (ref 37–145)
Total IBC: 374 ug/dL (ref 148–506)
UIBC: 345 ug/dL (ref 112–346)

## 2020-07-12 LAB — FERRITIN, BLOOD: Ferritin: 35 ng/mL (ref 13–150)

## 2020-07-13 ENCOUNTER — Other Ambulatory Visit (INDEPENDENT_AMBULATORY_CARE_PROVIDER_SITE_OTHER): Payer: Self-pay | Admitting: Registered"

## 2020-07-13 NOTE — Telephone Encounter (Signed)
[facility] Patient Outreach     Kimberly Curtis is a 65 year old female who I have contacted regarding a referral for the Diabetes & Nutrition Education Program.    Telephonic call placed to inform of eligibility for services, but was unable to reach patient.     Voicemail left with instructions to call back scheduler if interested in services at: [phone]

## 2020-07-19 ENCOUNTER — Telehealth (INDEPENDENT_AMBULATORY_CARE_PROVIDER_SITE_OTHER): Payer: Self-pay | Admitting: Pulmonary Medicine

## 2020-07-19 ENCOUNTER — Encounter: Payer: Self-pay | Admitting: Student in an Organized Health Care Education/Training Program

## 2020-07-19 ENCOUNTER — Ambulatory Visit (INDEPENDENT_AMBULATORY_CARE_PROVIDER_SITE_OTHER): Payer: Medicare Other | Admitting: Pulmonary Medicine

## 2020-07-19 ENCOUNTER — Encounter (INDEPENDENT_AMBULATORY_CARE_PROVIDER_SITE_OTHER): Payer: Self-pay | Admitting: Pulmonary Medicine

## 2020-07-19 VITALS — BP 155/94 | HR 109 | Temp 97.7°F | Resp 20 | Ht 61.0 in | Wt 236.5 lb

## 2020-07-19 DIAGNOSIS — Z794 Long term (current) use of insulin: Secondary | ICD-10-CM

## 2020-07-19 DIAGNOSIS — R0602 Shortness of breath: Secondary | ICD-10-CM

## 2020-07-19 DIAGNOSIS — K219 Gastro-esophageal reflux disease without esophagitis: Secondary | ICD-10-CM

## 2020-07-19 DIAGNOSIS — R053 Chronic cough: Secondary | ICD-10-CM

## 2020-07-19 DIAGNOSIS — E119 Type 2 diabetes mellitus without complications: Secondary | ICD-10-CM

## 2020-07-19 DIAGNOSIS — D869 Sarcoidosis, unspecified: Secondary | ICD-10-CM

## 2020-07-19 NOTE — Telephone Encounter (Signed)
A call was placed to patient.  Left a detail message to get further information if by any chance she has completed a CT chest outside of [place] and if she has which location.

## 2020-07-19 NOTE — Progress Notes (Signed)
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    Patient was first diagnosed to have sarcoidosis ~1990's when she presented with GI upset and abnormal CT scan with ?interstital fibrosis. She underwent a needle biopsy "through my back" which showed diagnosis of sarcoidosis.     For the past 10 years, patient has being having frequent chest infections from 1-2 times a year to eventually 3-4 times a year. However, in 2020, patient was placed on methotrexate in order tot ry to help patient but "it wasn't doing any good" so it was stopped in Jan 2022.  Co-incidentally, in the past 2 years, she has ~6 times of chest infection/year. In 2022 so far, she has already been hospitalized 4 x, each time she responded to antibiotics.     In January 2022, her doctor in [place] placed ?bronchial/tracheal stent in patient for ?Chauncey Fischer but had to be removed after 1 month due to local irritation.     Patient also has sinus symptoms with endoscopic evidence of "yeast" and was given Diflucan for 7 days.    Twi months ago, she moved to [facility] and started her care at [facility].    Patient has been on prednisone since 2020, with baseline dose of 10 mg. Was on methotrexate for 2 years until Janaury 2022.      Past Medical History:  Patient Active Problem List   Diagnosis    Type 2 diabetes mellitus without complication, with long-term current use of insulin (CMS-HCC)    Sarcoidosis    Chronic pain syndrome    Recurrent falls    Chronic pain of both knees    Gastroesophageal reflux disease, unspecified whether esophagitis present    Nausea    Essential hypertension    Itching    Mixed  hyperlipidemia       Past Surgical History:  Past Surgical History:   Procedure Laterality Date    CHOLECYSTECTOMY      HYSTERECTOMY      KNEE ARTHROSCOPY Left      Past Surgical History:   Procedure Laterality Date    CHOLECYSTECTOMY      HYSTERECTOMY      KNEE ARTHROSCOPY Left        Medications: (Home)  Outpatient Medications Marked as Taking for the 07/19/20 encounter (Office Visit) with Eda Keys, MD   Medication Sig Dispense Refill    albuterol (PROVENTIL) (2.5 MG/3ML) 0.083% nebulization 3 mL (2.5 mg) by Nebulization route every 4 hours as needed for Wheezing. 1 each 2    Albuterol Sulfate 108 (90 Base) MCG/ACT AEPB Inhale 1 puff by mouth as needed.      amitriptyline (ELAVIL) 50 MG tablet Take 1 tablet (50 mg) by mouth nightly. 90 tablet 1    aspirin 81 MG EC tablet Take 1 tablet (81 mg) by mouth daily. 90 tablet 1    Baclofen 5 MG TABS Take 1 tablet (5 mg) by mouth in the morning and at bedtime. 60 tablet 2    empagliflozin (JARDIANCE) 25 mg tablet Take 1 tablet (25 mg) by mouth daily. 90 tablet 1  famotidine (PEPCID) 40 MG tablet Take 1 tablet (40 mg) by mouth daily. 90 tablet 1    fexofenadine (ALLEGRA) 180 MG tablet Take 1 tablet (180 mg) by mouth daily. 90 tablet 1    fluticasone-umeclidinium-vilanterol (TRELEGY ELLIPTA) 100-62.5-25 MCG/INH AEPB Inhale 1 puff by mouth daily.      folic acid (FOLVITE) 1 MG tablet Take 1 tablet (1 mg) by mouth daily. 90 tablet 1    furosemide (LASIX) 20 MG tablet Take 1 tablet (20 mg) by mouth 2 times daily. 60 tablet 3    HYDROCODONE-CHLORPHENIRAMINE PO 5 mL. Every 12 hours as needed for cough      hydrOXYzine HCL (ATARAX) 50 MG tablet Take 1 tablet (50 mg) by mouth 2 times daily as needed for Itching. Every 6 hours as need for itching 30 tablet 2    insulin glargine (LANTUS SOLOSTAR) 100 units/mL injection pen Inject 48 Units under the skin nightly. 15 mL 2    losartan (COZAAR) 100 MG tablet Take 1 tablet (100 mg) by mouth daily. 90  tablet 1    montelukast (SINGULAIR) 10 MG tablet Take 1 tablet (10 mg) by mouth every evening. 90 tablet 1    naloxone (NARCAN) 4 mg/0.1 mL nasal spray Spray 1 spray into one nostril once as needed.      NOVOLOG FLEXPEN 100 UNIT/ML injection pen Inject 20 Units under the skin 3 times daily as needed for High Blood Sugar. AM: 20 units + sliding scale Mid day: 17 + sliding scale and PM 12 + Sliding scale 15 mL 2    ondansetron (ZOFRAN ODT) 8 MG disintegrating tablet Take 1 tablet (8 mg) on or under the tongue every 8 hours as needed for Nausea/Vomiting. 30 tablet 0    oxycodone-acetaminophen (PERCOCET) 10-325 MG tablet Take 1 tablet by mouth every 8 hours as needed for Severe Pain (Pain Score 7-10). 90 tablet 0    pantoprazole (PROTONIX) 40 MG tablet Take 1 tablet (40 mg) by mouth every morning (before breakfast). 90 tablet 1    predniSONE (DELTASONE) 10 MG tablet Take 1 tablet (10 mg) by mouth daily. 90 tablet 1    pregabalin (LYRICA) 75 MG capsule Take 1 capsule (75 mg) by mouth 2 times daily. 1 tablet AM and 2 tablets PM 60 capsule 2    TECHLITE PEN NEEDLES 32G X 4 MM MISC Use one pen needle with each insulin administration. 300 each 0       Allergies:  Allergies   Allergen Reactions    Iodine [[employer]] Anaphylaxis    Sulfa Drugs Anaphylaxis       Social History:  Social History     Socioeconomic History    Marital status: Married     Spouse name: Not on file    Number of children: Not on file    Years of education: Not on file    Highest education level: Not on file   Occupational History    Not on file   Tobacco Use    Smoking status: Former Smoker    Smokeless tobacco: Never Used   Substance and Sexual Activity    Alcohol use: Not Currently    Drug use: Never    Sexual activity: Not on file   Other Topics Concern    Not on file   Social History Narrative    Not on file     Social Determinants of Health     Financial Resource Strain: Not on file  Food Insecurity: Not on  file   Transportation Needs: Not on file   Physical Activity: Not on file   Stress: Not on file   Social Connections: Not on file   Intimate Partner Violence: Not on file   Housing Stability: Not on file     Social History     Socioeconomic History    Marital status: Married     Spouse name: Not on file    Number of children: Not on file    Years of education: Not on file    Highest education level: Not on file   Occupational History    Not on file   Tobacco Use    Smoking status: Former Smoker    Smokeless tobacco: Never Used   Substance and Sexual Activity    Alcohol use: Not Currently    Drug use: Never    Sexual activity: Not on file   Other Topics Concern    Not on file   Social History Narrative    Not on file     Social Determinants of Health     Financial Resource Strain: Not on file   Food Insecurity: Not on file   Transportation Needs: Not on file   Physical Activity: Not on file   Stress: Not on file   Social Connections: Not on file   Intimate Partner Violence: Not on file   Housing Stability: Not on file       Family History:  Family History   Problem Relation Age of Onset    Diabetes Mother     Heart Disease Mother     Kidney Disease Mother     Hypertension Father     Alcohol/Drug Father          Review of Systems:12 point review of systems was negative except per HPI    Physical Examination:: BP (!) 155/94 (BP Location: Left arm, BP Patient Position: Sitting, BP cuff size: Large)    Pulse 109    Temp 97.7 F (36.5 C) (Temporal)    Resp 20    Ht 5\' 1"  (1.549 m)    Wt 107.3 kg (236 lb 8 oz)    SpO2 96%    BMI 44.69 kg/m    General: Well developed, well nourished obese female, in no acute distress, AAOx3  HEENT: Normocephalic, atraumatic, PERRLA, oropharynx moist  Neck: Soft, supple  Lungs: few crackles right mid-lower field posteriorly. Minimal rhonchi  CVS: Regular rate and rhythm, no murmurs, rubs, or gallops  Abdomen: Soft, nontender, nondistended, no masses palpated, No CVA  tenderness bilaterally  Extremity: Warm, well perfused. No cyanosis, clubbing, 2+ edema: No focal deficits    Labs & Studies:  Common labs:   Albumin   Date Value Ref Range Status   07/09/2020 4.6 3.5 - 5.2 g/dL Corrected   07/09/2020 4.6 3.5 - 5.2 g/dL Corrected     ALT (SGPT)   Date Value Ref Range Status   07/09/2020 22 0 - 33 U/L Corrected   07/09/2020 22 0 - 33 U/L Corrected     AST (SGOT)   Date Value Ref Range Status   07/09/2020 21 0 - 32 U/L Corrected   07/09/2020 21 0 - 32 U/L Corrected     BUN   Date Value Ref Range Status   07/09/2020 11 8 - 23 mg/dL Corrected   07/09/2020 11 8 - 23 mg/dL Corrected     Calcium   Date Value Ref Range Status  07/09/2020 10.4 8.5 - 10.6 mg/dL Corrected   07/09/2020 10.4 8.5 - 10.6 mg/dL Corrected     Chloride   Date Value Ref Range Status   07/09/2020 98 98 - 107 mmol/L Corrected   07/09/2020 98 98 - 107 mmol/L Corrected     Cholesterol   Date Value Ref Range Status   07/09/2020 335 (H) <200 mg/dL Final     Comment:     Borderline Risk 200-240 mg/dL   High Risk > 240 mg/dL       Creatinine   Date Value Ref Range Status   07/09/2020 1.07 (H) 0.51 - 0.95 mg/dL Corrected   07/09/2020 1.07 (H) 0.51 - 0.95 mg/dL Corrected     GFR   Date Value Ref Range Status   07/09/2020 CANCELED mL/min      Comment:     Result canceled by the ancillary.   07/09/2020 >60 mL/min Final     Comment:     This is an estimated glomerular filtration rate   (mL/min/1.73 m2) based on the MDRD equation.   CKD Stage 3: GFR 30-59   CKD Stage 4: GFR 15-29   CKD Stage 5: GFR  < 15 or dialysis dependent       Glucose   Date Value Ref Range Status   07/09/2020 183 (H) 70 - 99 mg/dL Corrected   07/09/2020 183 (H) 70 - 99 mg/dL Corrected     HDL-Cholesterol   Date Value Ref Range Status   07/09/2020 48 mg/dL Final     Comment:     An HDL Cholesterol <40 mg/dL is a risk factor for  coronary heart disease.       Hgb   Date Value Ref Range Status   07/09/2020 12.7 11.2 - 15.7 gm/dL Final     Glyco Hgb (A1C)   Date  Value Ref Range Status   07/09/2020 8.4 (H) 4.8 - 5.8 % Final     Comment:     Hemoglobin A1c values of 5.7-6.4 percent indicate an increased risk for   developing diabetes mellitus. Hemoglobin A1c values greater than or equal   to 6.5 percent are diagnostic of diabetes mellitus. Diagnosis should be   confirmed by repeating the Hb A1c test. Hb F higher than 10 percent of   total Hb may yield falsely low results. Conditions that shorten red cell   survival, such as the presence of unstable hemoglobins like Hb SS, Hb CC,   and Hb SC, or other causes of hemolytic anemia may yield falsely low   results. Iron deficiency anemia may yield falsely high results.        Magnesium   Date Value Ref Range Status   05/12/2020 2.1 1.6 - 2.4 mg/dL Final     Plt Count   Date Value Ref Range Status   07/09/2020 395 (H) 140 - 370 1000/mm3 Final     Potassium   Date Value Ref Range Status   07/09/2020 3.7 3.5 - 5.1 mmol/L Corrected   07/09/2020 3.7 3.5 - 5.1 mmol/L Corrected     Sodium   Date Value Ref Range Status   07/09/2020 144 136 - 145 mmol/L Corrected   07/09/2020 144 136 - 145 mmol/L Corrected     Triglycerides   Date Value Ref Range Status   07/09/2020 288 (H) 10 - 170 mg/dL Final     WBC   Date Value Ref Range Status   07/09/2020 10.3 (H) 4.0 - 10.0 1000/mm3 Final  Urinalysis:   Color   Date Value Ref Range Status   05/13/2020 Yellow Yellow Final     Appearance   Date Value Ref Range Status   05/13/2020 Turbid Clear Final     Glucose   Date Value Ref Range Status   05/13/2020 4+ (A) Negative Final     Bilirubin   Date Value Ref Range Status   05/13/2020 Negative Negative Final     Ketones   Date Value Ref Range Status   05/13/2020 3+ (A) Negative Final     Specific Gravity   Date Value Ref Range Status   05/13/2020 1.030 1.002 - 1.030 Final     Blood   Date Value Ref Range Status   05/13/2020 Negative Negative Final     pH   Date Value Ref Range Status   05/13/2020 6.0 5.0 - 8.0 Final     Protein   Date Value Ref Range  Status   05/13/2020 2+ (A) Negative Final     Urobilinogen   Date Value Ref Range Status   05/13/2020 1+ (A) Negative Final     Nitrite   Date Value Ref Range Status   05/13/2020 Negative Negative Final     Leuk Esterase   Date Value Ref Range Status   05/13/2020 75 (A) Negative Leu/uL Final     Comment:     Urine culture added by reflex based on this result.  Test interpretation:  25 Leu/uL = Trace  75 Leu/uL = 1+  250 Leu/uL = 2+  500 Leu/uL = 3+       WBC   Date Value Ref Range Status   05/13/2020 6-10 (A) 0-2/HPF Final     Comment:     The absence of >5-10 WBC, visible bacteria, leukocyte esterase, and nitrite   in urine has about 92- 98% negative predictive value for detecting urinary   tract infections. Performing urine cultures in these circumstances are   unlikely to provide clinical benefit.       RBC   Date Value Ref Range Status   05/13/2020 3-5 (A) 0-2/HPF Final       Microbiology:  No results found for: BLOODCULT, FUNGALBC, AFBBACTCULT, URINECULTURE, QTFERON, QUANTIFERON, CRYPTOAG, CRYPTOCAGCSF, COCCICF, COCCICFCSF, COCCIIMMDIIF, COCCIIMMCSF, HISTOAGUR, CMVDNAQT, CMVDNAQTCSF    Sodium   Date Value Ref Range Status   07/09/2020 144 136 - 145 mmol/L Corrected   07/09/2020 144 136 - 145 mmol/L Corrected     Potassium   Date Value Ref Range Status   07/09/2020 3.7 3.5 - 5.1 mmol/L Corrected   07/09/2020 3.7 3.5 - 5.1 mmol/L Corrected       Glyco Hgb (A1C)   Date Value Ref Range Status   07/09/2020 8.4 (H) 4.8 - 5.8 % Final     Comment:     Hemoglobin A1c values of 5.7-6.4 percent indicate an increased risk for   developing diabetes mellitus. Hemoglobin A1c values greater than or equal   to 6.5 percent are diagnostic of diabetes mellitus. Diagnosis should be   confirmed by repeating the Hb A1c test. Hb F higher than 10 percent of   total Hb may yield falsely low results. Conditions that shorten red cell   survival, such as the presence of unstable hemoglobins like Hb SS, Hb CC,   and Hb SC, or other causes  of hemolytic anemia may yield falsely low   results. Iron deficiency anemia may yield falsely high results.  Radiologic Studies:      Other:      Assessment/Plan:  65 year old female with sarcoidosis and recurrent chest infections. 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 Also on chronic immunosuppression with methotrexate and prednisone (now just on prednisone).    On lyrica for chronic cough.    Symptoms of tiredness worsened after COVID infection March 2020    I am not sure that the sarcoidosis is that active and worry that infection is drivign most of teh issue. Also need to figure out source of infection? Chronic bacterial colonization vs reflux in setting fo steroid/obesity?      Plan  1. CT chest  2. X-ray esophagus  3. Sputum culture  4. Decrease prednisone to 10/5 alternate dose for 2 weeks, then 5 mg daily  5. Patient already loss 40 LB: continue weight loss effort.  6. Records from [facility]  7. ECHO    Return to clinic in: 1 month  Nicole Kindred, MD

## 2020-07-19 NOTE — Addendum Note (Signed)
Addended by: Harrel Carina on: 07/19/2020 06:38 PM     Modules accepted: Orders

## 2020-08-07 ENCOUNTER — Other Ambulatory Visit: Payer: Self-pay

## 2020-08-13 ENCOUNTER — Telehealth (INDEPENDENT_AMBULATORY_CARE_PROVIDER_SITE_OTHER): Payer: Self-pay | Admitting: Pulmonary Medicine

## 2020-08-13 NOTE — Telephone Encounter (Signed)
Spouse Ervin requesting for a call back from Dr. Cletis Media. Pt admitted at [facility] for 3 days now due to difficulties in her lungs, at room 3144 PCU Unit. Pt would like to also be transferred to [place]. Ervin requesting for a high priority message. Please assist, thank you.    Ph: [phone]  Rm. 3144 PCU Unit

## 2020-08-14 NOTE — Telephone Encounter (Signed)
Patient spouse is following up message below. He states that patient is still wanting to be transferred to [facility] but would like to know if it that is going to be charged to her Medicare insurance or if anything will get billed for a hospital transfer.   Please advise pt, thank you

## 2020-08-14 NOTE — Telephone Encounter (Signed)
Returned call to patient, spoke to spouse Kimberly Curtis regarding a request to transfer to a [place] he needs to speak to [facility] case Regulatory affairs officer for facilitate a transfer request.  Spouse states they spoke to the case manager already, and they are waiting to hear back approval of transfer.     Informed patient to continue to coordinate with case manager for transfer request, and to also ask this person any billing questions they might have.  Kimberly Curtis acknowledged instructions. Requested to have Dr. Cletis Media be made aware of current hospitalization and transfer request.

## 2020-08-14 NOTE — Telephone Encounter (Signed)
Returned call to patient, no answer at this time, left call back voicemail message for [facility] at number [phone].

## 2020-08-18 ENCOUNTER — Encounter (INDEPENDENT_AMBULATORY_CARE_PROVIDER_SITE_OTHER): Payer: Self-pay | Admitting: Pulmonary Medicine

## 2020-08-18 ENCOUNTER — Encounter (INDEPENDENT_AMBULATORY_CARE_PROVIDER_SITE_OTHER): Payer: Self-pay | Admitting: Student in an Organized Health Care Education/Training Program

## 2020-08-18 DIAGNOSIS — D869 Sarcoidosis, unspecified: Secondary | ICD-10-CM

## 2020-08-21 ENCOUNTER — Encounter (INDEPENDENT_AMBULATORY_CARE_PROVIDER_SITE_OTHER): Payer: Self-pay | Admitting: Pulmonary Medicine

## 2020-08-21 ENCOUNTER — Other Ambulatory Visit (INDEPENDENT_AMBULATORY_CARE_PROVIDER_SITE_OTHER): Payer: Self-pay | Admitting: Student in an Organized Health Care Education/Training Program

## 2020-08-21 ENCOUNTER — Other Ambulatory Visit: Payer: Self-pay

## 2020-08-21 ENCOUNTER — Telehealth (INDEPENDENT_AMBULATORY_CARE_PROVIDER_SITE_OTHER): Payer: Medicare Other | Admitting: Student in an Organized Health Care Education/Training Program

## 2020-08-21 DIAGNOSIS — L299 Pruritus, unspecified: Secondary | ICD-10-CM

## 2020-08-21 DIAGNOSIS — Z09 Encounter for follow-up examination after completed treatment for conditions other than malignant neoplasm: Secondary | ICD-10-CM

## 2020-08-21 DIAGNOSIS — Z23 Encounter for immunization: Secondary | ICD-10-CM

## 2020-08-21 DIAGNOSIS — R059 Cough, unspecified: Secondary | ICD-10-CM

## 2020-08-21 MED ORDER — HYDROCOD POLST-CPM POLST ER 10-8 MG/5ML PO SUER
5.0000 mL | Freq: Two times a day (BID) | ORAL | 0 refills | Status: DC | PRN
Start: 2020-08-21 — End: 2021-01-14

## 2020-08-21 MED ORDER — HYDROXYZINE HCL 50 MG OR TABS
ORAL_TABLET | ORAL | 1 refills | Status: DC
Start: 2020-08-21 — End: 2020-12-05

## 2020-08-21 NOTE — Progress Notes (Signed)
Internal Medicine Clinic Note (Video Visit)        Demographics:   Medical Record #: [id number]   Date: Aug 21, 2020   Patient Name: Kimberly Curtis   DOB: 02-27-1956  Age: 65 year old  Sex: female  Location: home address on file    Evaluator(s):   Breane Josephson was evaluated by me today.    Clinic Location: [facility]  [address]  [place] [address]    Justification for Telehealth Service:  Due to the COVID-19 Pandemic and a federally declared state of public health emergency, this service is being conducted via video visit.      Chief Complaint   Patient presents with    Hospital F/U       Subjective:      Kimberly Curtis is a 65 year old female who is here for a virtual based visit.    Patient consent was acquired.  LV 4/18  She presents for acute visit for hospital f/u    Recently admitted to [facility] for PNA  Pt was visiting daughter in [place] during the time   5/17-5/28 - AHRF 2/2 PNA/sarcoidosis c/b bloody stools  Had GLF in the hospital, with head trauma, does not remember  Did CT scan and was negative  Upgraded to ICU, was not intubated but reports not remembering much  Pt faxed over DC summary (in media)  Last day of Ceftin 500 BID today  On Medrol dose pack taper  Discharged on 3L 02 continuous  Has been using nebulizer more frequent since recent admission  Still reports coughing w/ subsequent body aches, weakness  Denies fevers, chills, SOB    History:  Sarcoidosis c/b frequent PNA/bronchitis s/p bronchial stent-previously on chronic steroids, tapered off Prednisone 10 mg, Albuterol neb PRN, MDI uses more frequently, montelukast 10 mg daily, Trelegy daily. Had bronchial stents recently taken out on 04/30/2020 in [place]. Last CT scan 04/2020. Recently seen by [facility], pending CT chest  Chronic cough causing B/l abd/thoracic pain-intermittent. Previously prescribed Tussionex, requesting refill on this  T2DM w/  peripheral neuropathies, occasional blurry vision-last A1c 8.4% (06/2020). On Lantus 48, Novolog 20-17-12 TID plus ISS, Jardiance 25 mg daily. Last eye exam >10 years ago, pending repeat eye exam, Endo/CDE referral. Elevated in the setting of chronic steroids, now tapering off   HTN-on Losartan 100, Lasix 20 BID. Checks BP at home, 120/70s  HLD-previously on Atorvastatin, stopped as she ran out. Takes ASA 81  GERD-Protonix 40 mg daily, Pepcid 40 daily  Obesity, BMI 44  Chronic pain, OA of knees/back, sarcoid pain-on Baclofen 5, Lyrica 75, Amitriptyline 50 mg, Percocet 10 (takes 2-3 doses daily). Stopped Tramadol. Has Narcan at home, never used. Seeing pain specialist in [place] 180 daily  Itching-Hydroxyzine 50 mg PRN  Constipation-BM every 1-3 days, has had more diarrhea since moving to [place].  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 Has 4 children. Stopped tobacco in 1996 (10 pack year history). Denies alcohol use.     Assessment and Plan:       Problem List Items Addressed This Visit    None  Visit Diagnoses     Hospital discharge follow-up    -  Primary    Reviewed DC sum. Cont steroid pack/taper. Finished Abx. Rx for Tussionex to pharmacy, can switch if not available. Cont 02/neb/mdi, pulm f/u. return precautions    Need for vaccination        Cough        Relevant Medications    Hydrocod Polst-CPM Polst ER 10-8 MG/5ML SUER        F/u with me in Person for next visit.    Health Maintenance   Topic Date Due    Breast Cancer Screen  Never done    Diabetic Retinal Exam  Never done    Diabetic Foot Exam  Never done    Cervical Cancer Screening  Never done    Pneumococcal Vaccine (1 - PCV) Never done    Medicare Annual Wellness Visit  Never done    Shingles Vaccine  (1 of 2) 07/09/2021 (Originally 09/20/2007)    COVID-19 Vaccine (1) 07/09/2021 (Originally 09/20/1960)    Diabetes A1c  10/08/2020    Influenza (Season Ended) 10/22/2020    PHQ9 Depression Monitoring doc flowsheet  11/08/2020    LDL Monitoring  07/09/2021    Diabetes Urine Microalbumin  07/09/2021    Colorectal Cancer Screening  08/23/2027    Tetanus (2 - Td or Tdap) 07/10/2030    Hepatitis C Screening  Completed    Polio Vaccine  Aged Out    HPV Vaccine <= 26 Yrs  Aged Out    Meningococcal MCV4 Vaccine  Aged Out         Current Outpatient Medications   Medication Sig Dispense Refill    albuterol (PROVENTIL) (2.5 MG/3ML) 0.083% nebulization 3 mL (2.5 mg) by Nebulization route every 4 hours as needed for Wheezing. 1 each 2    Albuterol Sulfate 108 (90 Base) MCG/ACT AEPB Inhale 1 puff by mouth as needed.      amitriptyline (ELAVIL) 50 MG tablet Take 1 tablet (50 mg) by mouth nightly. 90 tablet 1    aspirin 81 MG EC tablet Take 1 tablet (81 mg) by mouth daily. 90 tablet 1    Baclofen 5 MG TABS Take 1 tablet (5 mg) by mouth in the morning and at bedtime. 60 tablet 2    empagliflozin (JARDIANCE) 25 mg tablet Take 1 tablet (25 mg) by mouth daily. 90 tablet 1    famotidine (PEPCID) 40 MG tablet Take 1 tablet (40 mg) by mouth daily. 90 tablet 1    fexofenadine (ALLEGRA) 180 MG tablet Take 1 tablet (180 mg) by mouth daily. 90 tablet 1    fluticasone-umeclidinium-vilanterol (TRELEGY ELLIPTA) 100-62.5-25 MCG/INH AEPB Inhale 1 puff by mouth daily.      folic acid (FOLVITE) 1 MG tablet Take 1 tablet (1 mg) by mouth daily. 90 tablet 1    furosemide (LASIX) 20 MG tablet Take 1 tablet (20 mg) by mouth 2 times daily. 60 tablet 3    Hydrocod Polst-CPM Polst ER 10-8 MG/5ML SUER Take 5 mL by mouth every 12 hours as needed (cough). 240 mL 0    HYDROCODONE-CHLORPHENIRAMINE PO 5 mL. Every 12 hours as needed for cough      hydrOXYzine HCL (ATARAX) 50 MG tablet TAKE 1 TABLET BY MOUTH 2 TIMES DAILY AS NEEDED FOR  ITCHING 180 tablet 1    insulin glargine (LANTUS SOLOSTAR) 100 units/mL injection pen Inject 48 Units under the skin nightly. 15 mL 2    losartan (COZAAR) 100  MG tablet Take 1 tablet (100 mg) by mouth daily. 90 tablet 1    montelukast (SINGULAIR) 10 MG tablet Take 1 tablet (10 mg) by mouth every evening. 90 tablet 1    naloxone (NARCAN) 4 mg/0.1 mL nasal spray Spray 1 spray into one nostril once as needed.      NOVOLOG FLEXPEN 100 UNIT/ML injection pen Inject 20 Units under the skin 3 times daily as needed for High Blood Sugar. AM: 20 units + sliding scale Mid day: 17 + sliding scale and PM 12 + Sliding scale 15 mL 2    ondansetron (ZOFRAN ODT) 8 MG disintegrating tablet Take 1 tablet (8 mg) on or under the tongue every 8 hours as needed for Nausea/Vomiting. 30 tablet 0    oxycodone-acetaminophen (PERCOCET) 10-325 MG tablet Take 1 tablet by mouth every 8 hours as needed for Severe Pain (Pain Score 7-10). 90 tablet 0    pantoprazole (PROTONIX) 40 MG tablet Take 1 tablet (40 mg) by mouth every morning (before breakfast). 90 tablet 1    predniSONE (DELTASONE) 10 MG tablet Take 1 tablet (10 mg) by mouth daily. 90 tablet 1    pregabalin (LYRICA) 75 MG capsule Take 1 capsule (75 mg) by mouth 2 times daily. 1 tablet AM and 2 tablets PM 60 capsule 2    TECHLITE PEN NEEDLES 32G X 4 MM MISC Use one pen needle with each insulin administration. 300 each 0     No current facility-administered medications for this visit.            Objective:     There were no vitals filed for this visit.    Physical Exam   General: In NAD, alert, non-toxic appearing.  HEENT: NC/AT.  Lungs: Normal respiratory movement.   Psych: Mood and affect normal      Labs and Imaging:     Lab Results   Component Value Date    WBC 10.3 (H) 07/09/2020    RBC 5.36 (H) 07/09/2020    HGB 12.7 07/09/2020    HCT 44.0 07/09/2020    MCV 82.1 07/09/2020    MCHC 28.9 (L) 07/09/2020    RDW 16.5 (H) 07/09/2020    PLT 395 (H) 07/09/2020    MPV 10.2 07/09/2020        Lab Results   Component Value Date    BUN 11 07/09/2020    BUN 11 07/09/2020    CREAT 1.07 (H) 07/09/2020    CREAT 1.07 (H) 07/09/2020    CL 98 07/09/2020    CL 98 07/09/2020    NA 144 07/09/2020    NA 144 07/09/2020    K 3.7 07/09/2020    K 3.7 07/09/2020    [place] 10.4 07/09/2020    [place] 10.4 07/09/2020    TBILI 0.36 07/09/2020    TBILI 0.36 07/09/2020    ALB 4.6 07/09/2020    ALB 4.6 07/09/2020    TP 6.9 07/09/2020    TP 6.9 07/09/2020    AST 21 07/09/2020    AST 21 07/09/2020    ALK 112 (H) 07/09/2020    ALK 112 (H) 07/09/2020    BICARB 32 (H) 07/09/2020    BICARB 32 (H) 07/09/2020    ALT 22 07/09/2020    ALT 22 07/09/2020    GLU 183 (H) 07/09/2020    GLU 183 (H) 07/09/2020       Lipids:    Lab Results   Component Value Date    CHOL 335 (  H) 07/09/2020    HDL 48 07/09/2020    LDLCALC 229 (H) 07/09/2020    TRIG 288 (H) 07/09/2020       Diabetes screen:  Lab Results   Component Value Date    A1C 8.4 (H) 07/09/2020       No results found.        Followup:       No follow-ups on file.    Future Appointments   Date Time Provider [place]   08/21/2020  4:40 PM [name], MD [facility] [place]   09/03/2020  1:00 PM Eda Keys, MD [facility] [phone]         Medication Review:  Medications reviewed with patient and medication list reconciled.  Patient's understanding and response to medications assessed.   Patient Instruction: See Patient Education Section  No barriers to learning, verbalizes understanding of teaching and instructions.            [name], MD  [facility]  Internal Medicine       ---------------------(data below generated by [name], MD)--------------------     Patient Verification & Telemedicine Consent & Financial Waiver:    1.   Identity: I have verified this patient's identity to be accurate.  2.   Consent: I verify consent has been secured in one of the following methods: (a) obtained written/ online attestation consent (via MyChartVideoVisit  pathway), (b) the spoke-side provider has obtained verbal or written consent from patient/surrogate (if this is a "provider to provider" evaluation), or (c) in all other cases, I have personally obtained verbal consent from the patient/ surrogate (noting all elements below) to perform this voluntary telemedicine evaluation (including obtaining history, performing examination and reviewing data provided by the patient).   The patient/ surrogate has the right to refuse this evaluation.  I have explained risks (including potential loss of confidentiality), benefits, alternatives, and the potential need for subsequent face to face care. Patient/ surrogate understands that there is a risk of medical inaccuracies given that our recommendations will be made based on reported data (and we must therefore assume this information is accurate).  Knowing that there is a risk that this information is not reported accurately, and that the telemedicine video, audio, or data feed may be incomplete, the patient agrees to proceed with evaluation and holds [place] harmless knowing these risks.  3.   Healthcare Team: The patient/ surrogate has been notified that other healthcare professionals (including students, residents and Metallurgist) may be involved in this audio-video evaluation.   All laws concerning confidentiality and patient access to medical records and copies of medical records apply to telemedicine.  4.   Privacy: If this is a Radiographer, therapeutic Visit, the patient/ surrogate has received the [place] Notice of Privacy Practices via E-Checkin process.  For all other video visit techniques, I have verbally provided the patient/ surrogate with the [place] in [place] ([email or web address]://[email or web address]) or Spanish ([email or web address]://health.https://www.matthews.[email or web address]).  The patient/ surrogate acknowledges both being provided the NPP link, and has been offered to have the NPP mailed to the patient/ surrogate by [place]  mail.  The patient/ surrogate has voiced understanding an acknowledgement of receipt of this NPP web address.  If the patient/surrogate has elected to receive the NPP via [place] mail, I verify that the NPP will be sent promptly to the patient/surrogate via [place] mail.  5.   Capacity: I have reviewed this above verification and consent paragraph with the patient/ surrogate and  the patient is capacitated or has a surrogate. If the patient is not capacitated to understand the above, and no surrogate is available, since this is not an emergency evaluation, the visit will be rescheduled until such time that the patient can consent, or the surrogate is available to consent. If this is an emergency evaluation and the patient is not capacitated to understand the above, and no surrogate is available, I am proceeding with this evaluation as this is felt to be an emergency setting and no appropriate specialist is available at the bedside to perform these evaluations.  6.   Financial Waiver: If this is a Radiographer, therapeutic Visit, the patient has been made aware of the financial waiver via E-Checkin process.  For all other video visit techniques, an E-Checkin process is not performed.  As such, I have personally verbally informed the patient/ surrogate that this evaluation will be a billable encounter similar to an in-person clinic visit, and the patient/ surrogate has agreed to pay the fee for services rendered.  If we are billing insurance for the patient's telehealth visit, her out-of-pocket cost will be determined based on her plan and will be billed to her.  The patient/ surrogate has also been informed that if the patient does not have insurance or does not wish to use insurance, [place] [employer] price for a primary care telehealth visit is $59.00 and specialist telehealth visit is $88.00.  I have further informed the patient/ surrogate that in the event the patient has additional services provided in conjunction with the  specialty visit (Ex. Psychotherapy services), those services will be billed at the current rate less a 45% discount.  7.   Intra-State Location: The patient/ surrogate attests to understanding that if the patient accesses these services from a location outside of [place], that the patient does so at the patient's own risk and initiative and that the patient is ultimately responsible for compliance with any laws or regulations associated with the patient's use.  8.   Specific Use:The patient/ surrogate understands that [facility] [place] makes no representation that materials or servicesdelivered via telecommunication services, or listed on telemedicine websites, are appropriate or available for use in any other location.           Demographics:  Medical Record #: [id number]  Date: Aug 21, 2020  Patient Name: Kimberly Curtis  DOB: 1955/10/26  Age: 65 year old  Sex: female  Location: home address on file     Evaluator(s):  Lilliane Gregori was evaluated by me today.    Clinic Location: [facility]  [place] [address]

## 2020-08-21 NOTE — Telephone Encounter (Signed)
From: Girtha Mahmood  To: [name], MD  Sent: 08/18/2020 7:39 PM PDT  Subject: Refill     Can you please send me in a refill on my Hydrocodone-Chlorphen ER Susp or Tussionex just got out of the hospital and really need it I   Have a appointment on May 31 I'm praying you can have it before then

## 2020-08-22 NOTE — Telephone Encounter (Signed)
Handled on another encounter

## 2020-08-23 ENCOUNTER — Encounter (INDEPENDENT_AMBULATORY_CARE_PROVIDER_SITE_OTHER): Payer: Self-pay | Admitting: Student in an Organized Health Care Education/Training Program

## 2020-08-23 NOTE — Telephone Encounter (Signed)
Opened in error.  Please close encounter.

## 2020-08-27 ENCOUNTER — Ambulatory Visit (INDEPENDENT_AMBULATORY_CARE_PROVIDER_SITE_OTHER): Payer: Medicare Other | Admitting: Pulmonary Medicine

## 2020-08-27 VITALS — BP 146/70 | HR 98 | Temp 98.3°F | Resp 16 | Ht 61.0 in | Wt 236.6 lb

## 2020-08-27 DIAGNOSIS — J189 Pneumonia, unspecified organism: Secondary | ICD-10-CM

## 2020-08-27 DIAGNOSIS — D869 Sarcoidosis, unspecified: Secondary | ICD-10-CM

## 2020-08-27 MED ORDER — LEVOFLOXACIN 750 MG OR TABS
750.0000 mg | ORAL_TABLET | Freq: Every day | ORAL | 0 refills | Status: DC
Start: 2020-08-27 — End: 2020-10-12

## 2020-08-27 NOTE — Progress Notes (Signed)
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    Other PMHx significant for HTN, elevated BMI, and T2DM/peripheral neuropathy    Patient was first diagnosed to have sarcoidosis ~1990's when she presented with GI upset and abnormal CT scan with ?interstital fibrosis. She underwent a needle biopsy "through my back" which showed diagnosis of sarcoidosis.     For the past 10 years, patient has being having frequent chest infections from 1-2 times a year to eventually 3-4 times a year. However, in 2020, patient was placed on methotrexate in order tot ry to help patient but "it wasn't doing any good" so it was stopped in Jan 2022.  Co-incidentally, in the past 2 years, she has ~6 times of chest infection/year. In 2022 so far, she has already been hospitalized 4 x, each time she responded to antibiotics.     In January 2022, her doctor in [place] placed ?bronchial/tracheal stent in patient for ?Chauncey Fischer but had to be removed after 1 month due to local irritation.     Patient also has sinus symptoms with endoscopic evidence of "yeast" and was given Diflucan for 7 days.    Twi months ago, she moved to [facility] and started her care at [facility].    Patient has been on prednisone since 2020, with baseline dose of 10 mg. Was on methotrexate for 2 years until Janaury 2022.    EVENTS  Patient was admitted to [facility] for pneumonia 5/17-27/2022 while visiting daughter in [place], including short stay in ICU  with [facility], and completed antibiotics (Ceftin 5/31): also placed on steroid taper, now off for 2 days. She developed sudden  cough over night followed by SOB and fever, as well as "really really drained".    Patient still complained of productive cough, and placed back on oxygen for shortness of breath. Overall felt somewhat better since discharged but has not changed much last few days.      Past Medical History:  Patient Active Problem List   Diagnosis    Type 2 diabetes mellitus without complication, with long-term current use of insulin (CMS-HCC)    Sarcoidosis    Chronic pain syndrome    Recurrent falls    Chronic pain of both knees    Gastroesophageal reflux disease, unspecified whether esophagitis present    Nausea    Essential hypertension    Itching    Mixed hyperlipidemia       Past Surgical History:  Past Surgical History:   Procedure Laterality Date    CHOLECYSTECTOMY      HYSTERECTOMY      KNEE ARTHROSCOPY Left      Past Surgical History:   Procedure Laterality Date    CHOLECYSTECTOMY      HYSTERECTOMY      KNEE ARTHROSCOPY Left        Medications: (Home)  No outpatient medications have been marked as taking for the 08/27/20 encounter (Appointment) with Eda Keys, MD.       Allergies:  Allergies   Allergen Reactions    Iodine [  [employer]] Anaphylaxis    Sulfa Drugs Anaphylaxis       Social History:  Social History     Socioeconomic History    Marital status: Married     Spouse name: Not on file    Number of children: Not on file    Years of education: Not on file    Highest education level: Not on file   Occupational History    Not on file   Tobacco Use    Smoking status: Former Smoker    Smokeless tobacco: Never Used   Substance and Sexual Activity    Alcohol use: Not Currently    Drug use: Never    Sexual activity: Not on file   Other Topics Concern    Not on file   Social History Narrative    Not on file     Social Determinants of Health     Financial Resource Strain: Not on file   Food Insecurity: Not on file   Transportation Needs: Not on file   Physical Activity:  Not on file   Stress: Not on file   Social Connections: Not on file   Intimate Partner Violence: Not on file   Housing Stability: Not on file     Social History     Socioeconomic History    Marital status: Married     Spouse name: Not on file    Number of children: Not on file    Years of education: Not on file    Highest education level: Not on file   Occupational History    Not on file   Tobacco Use    Smoking status: Former Smoker    Smokeless tobacco: Never Used   Substance and Sexual Activity    Alcohol use: Not Currently    Drug use: Never    Sexual activity: Not on file   Other Topics Concern    Not on file   Social History Narrative    Not on file     Social Determinants of Health     Financial Resource Strain: Not on file   Food Insecurity: Not on file   Transportation Needs: Not on file   Physical Activity: Not on file   Stress: Not on file   Social Connections: Not on file   Intimate Partner Violence: Not on file   Housing Stability: Not on file       Family History:  Family History   Problem Relation Age of Onset    Diabetes Mother     Heart Disease Mother     Kidney Disease Mother     Hypertension Father     Alcohol/Drug Father          Review of Systems:12 point review of systems was negative except per HPI    Physical Examination:: There were no vitals taken for this visit.   General: Well developed, well nourished obese female, in no acute distress, AAOx3  HEENT: Normocephalic, atraumatic, PERRLA, oropharynx moist  Neck: Soft, supple  Lungs: few crackles right mid-lower field posteriorly. Minimal rhonchi  CVS: Regular rate and rhythm, no murmurs, rubs, or gallops  Abdomen: Soft, nontender, nondistended, no masses palpated, No CVA tenderness bilaterally  Extremity: Warm, well perfused. No cyanosis, clubbing, 2+ edema: No focal deficits    Labs & Studies:  Common labs:   Albumin   Date Value Ref Range Status   07/09/2020 4.6 3.5 - 5.2 g/dL Corrected   07/09/2020 4.6 3.5 -  5.2 g/dL  Corrected     ALT (SGPT)   Date Value Ref Range Status   07/09/2020 22 0 - 33 U/L Corrected   07/09/2020 22 0 - 33 U/L Corrected     AST (SGOT)   Date Value Ref Range Status   07/09/2020 21 0 - 32 U/L Corrected   07/09/2020 21 0 - 32 U/L Corrected     BUN   Date Value Ref Range Status   07/09/2020 11 8 - 23 mg/dL Corrected   07/09/2020 11 8 - 23 mg/dL Corrected     Calcium   Date Value Ref Range Status   07/09/2020 10.4 8.5 - 10.6 mg/dL Corrected   07/09/2020 10.4 8.5 - 10.6 mg/dL Corrected     Chloride   Date Value Ref Range Status   07/09/2020 98 98 - 107 mmol/L Corrected   07/09/2020 98 98 - 107 mmol/L Corrected     Cholesterol   Date Value Ref Range Status   07/09/2020 335 (H) <200 mg/dL Final     Comment:     Borderline Risk 200-240 mg/dL   High Risk > 240 mg/dL       Creatinine   Date Value Ref Range Status   07/09/2020 1.07 (H) 0.51 - 0.95 mg/dL Corrected   07/09/2020 1.07 (H) 0.51 - 0.95 mg/dL Corrected     GFR   Date Value Ref Range Status   07/09/2020 CANCELED mL/min      Comment:     Result canceled by the ancillary.   07/09/2020 >60 mL/min Final     Comment:     This is an estimated glomerular filtration rate   (mL/min/1.73 m2) based on the MDRD equation.   CKD Stage 3: GFR 30-59   CKD Stage 4: GFR 15-29   CKD Stage 5: GFR  < 15 or dialysis dependent       Glucose   Date Value Ref Range Status   07/09/2020 183 (H) 70 - 99 mg/dL Corrected   07/09/2020 183 (H) 70 - 99 mg/dL Corrected     HDL-Cholesterol   Date Value Ref Range Status   07/09/2020 48 mg/dL Final     Comment:     An HDL Cholesterol <40 mg/dL is a risk factor for  coronary heart disease.       Hgb   Date Value Ref Range Status   07/09/2020 12.7 11.2 - 15.7 gm/dL Final     Glyco Hgb (A1C)   Date Value Ref Range Status   07/09/2020 8.4 (H) 4.8 - 5.8 % Final     Comment:     Hemoglobin A1c values of 5.7-6.4 percent indicate an increased risk for   developing diabetes mellitus. Hemoglobin A1c values greater than or equal   to 6.5 percent are  diagnostic of diabetes mellitus. Diagnosis should be   confirmed by repeating the Hb A1c test. Hb F higher than 10 percent of   total Hb may yield falsely low results. Conditions that shorten red cell   survival, such as the presence of unstable hemoglobins like Hb SS, Hb CC,   and Hb SC, or other causes of hemolytic anemia may yield falsely low   results. Iron deficiency anemia may yield falsely high results.        Magnesium   Date Value Ref Range Status   05/12/2020 2.1 1.6 - 2.4 mg/dL Final     Plt Count   Date Value Ref Range Status   07/09/2020 395 (H)  140 - 370 1000/mm3 Final     Potassium   Date Value Ref Range Status   07/09/2020 3.7 3.5 - 5.1 mmol/L Corrected   07/09/2020 3.7 3.5 - 5.1 mmol/L Corrected     Sodium   Date Value Ref Range Status   07/09/2020 144 136 - 145 mmol/L Corrected   07/09/2020 144 136 - 145 mmol/L Corrected     Triglycerides   Date Value Ref Range Status   07/09/2020 288 (H) 10 - 170 mg/dL Final     WBC   Date Value Ref Range Status   07/09/2020 10.3 (H) 4.0 - 10.0 1000/mm3 Final       Urinalysis:   Color   Date Value Ref Range Status   05/13/2020 Yellow Yellow Final     Appearance   Date Value Ref Range Status   05/13/2020 Turbid Clear Final     Glucose   Date Value Ref Range Status   05/13/2020 4+ (A) Negative Final     Bilirubin   Date Value Ref Range Status   05/13/2020 Negative Negative Final     Ketones   Date Value Ref Range Status   05/13/2020 3+ (A) Negative Final     Specific Gravity   Date Value Ref Range Status   05/13/2020 1.030 1.002 - 1.030 Final     Blood   Date Value Ref Range Status   05/13/2020 Negative Negative Final     pH   Date Value Ref Range Status   05/13/2020 6.0 5.0 - 8.0 Final     Protein   Date Value Ref Range Status   05/13/2020 2+ (A) Negative Final     Urobilinogen   Date Value Ref Range Status   05/13/2020 1+ (A) Negative Final     Nitrite   Date Value Ref Range Status   05/13/2020 Negative Negative Final     Leuk Esterase   Date Value Ref Range  Status   05/13/2020 75 (A) Negative Leu/uL Final     Comment:     Urine culture added by reflex based on this result.  Test interpretation:  25 Leu/uL = Trace  75 Leu/uL = 1+  250 Leu/uL = 2+  500 Leu/uL = 3+       WBC   Date Value Ref Range Status   05/13/2020 6-10 (A) 0-2/HPF Final     Comment:     The absence of >5-10 WBC, visible bacteria, leukocyte esterase, and nitrite   in urine has about 92- 98% negative predictive value for detecting urinary   tract infections. Performing urine cultures in these circumstances are   unlikely to provide clinical benefit.       RBC   Date Value Ref Range Status   05/13/2020 3-5 (A) 0-2/HPF Final       Microbiology:  No results found for: BLOODCULT, FUNGALBC, AFBBACTCULT, URINECULTURE, QTFERON, QUANTIFERON, CRYPTOAG, CRYPTOCAGCSF, COCCICF, COCCICFCSF, COCCIIMMDIIF, COCCIIMMCSF, HISTOAGUR, CMVDNAQT, CMVDNAQTCSF    Sodium   Date Value Ref Range Status   07/09/2020 144 136 - 145 mmol/L Corrected   07/09/2020 144 136 - 145 mmol/L Corrected     Potassium   Date Value Ref Range Status   07/09/2020 3.7 3.5 - 5.1 mmol/L Corrected   07/09/2020 3.7 3.5 - 5.1 mmol/L Corrected       Glyco Hgb (A1C)   Date Value Ref Range Status   07/09/2020 8.4 (H) 4.8 - 5.8 % Final     Comment:     Hemoglobin  A1c values of 5.7-6.4 percent indicate an increased risk for   developing diabetes mellitus. Hemoglobin A1c values greater than or equal   to 6.5 percent are diagnostic of diabetes mellitus. Diagnosis should be   confirmed by repeating the Hb A1c test. Hb F higher than 10 percent of   total Hb may yield falsely low results. Conditions that shorten red cell   survival, such as the presence of unstable hemoglobins like Hb SS, Hb CC,   and Hb SC, or other causes of hemolytic anemia may yield falsely low   results. Iron deficiency anemia may yield falsely high results.          Radiologic Studies:      Other:      Assessment/Plan:  65 year old female with sarcoidosis and recurrent chest infections.  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 Also on chronic immunosuppression with methotrexate and prednisone (now just on prednisone).    On lyrica for chronic cough.    Symptoms of tiredness worsened after COVID infection March 2020    More recent pneumonia but I am concern that the infection is not well treated. Only limited information available at this time.    Discussed about the side effect of antibiotics esp with levaquin includijng effect on blood sugar as well as tendon rupture, and explained for early signs.      Plan  1. CXR  2. Sputum culture  3. Levaquin 750 mg daily for 10 days  4. Advised to go to ED if no improvement  Return to clinic in: 2-3 months (pateint to contact me early and do to local ED if no improvement in next 48 hours)  Nicole Kindred, MD

## 2020-08-28 ENCOUNTER — Encounter (INDEPENDENT_AMBULATORY_CARE_PROVIDER_SITE_OTHER): Payer: Self-pay | Admitting: Hospital

## 2020-08-28 NOTE — Addendum Note (Signed)
Addended by: Belva Crome on: 08/28/2020 01:36 PM     Modules accepted: Orders

## 2020-09-03 ENCOUNTER — Encounter (INDEPENDENT_AMBULATORY_CARE_PROVIDER_SITE_OTHER): Payer: Medicare Other | Admitting: Pulmonary Medicine

## 2020-09-05 ENCOUNTER — Encounter (INDEPENDENT_AMBULATORY_CARE_PROVIDER_SITE_OTHER): Payer: Self-pay | Admitting: Student in an Organized Health Care Education/Training Program

## 2020-09-08 ENCOUNTER — Encounter (INDEPENDENT_AMBULATORY_CARE_PROVIDER_SITE_OTHER): Payer: Self-pay | Admitting: Internal Medicine

## 2020-09-08 DIAGNOSIS — Z0000 Encounter for general adult medical examination without abnormal findings: Secondary | ICD-10-CM

## 2020-09-13 ENCOUNTER — Ambulatory Visit (INDEPENDENT_AMBULATORY_CARE_PROVIDER_SITE_OTHER): Payer: Self-pay | Admitting: Student in an Organized Health Care Education/Training Program

## 2020-09-13 ENCOUNTER — Encounter (INDEPENDENT_AMBULATORY_CARE_PROVIDER_SITE_OTHER): Payer: Self-pay | Admitting: Student in an Organized Health Care Education/Training Program

## 2020-09-13 NOTE — Telephone Encounter (Signed)
MyChart symptom message:    From: Kimberly Curtis  To: [name], MD  Sent: 09/13/2020 2:03 PM PDT  Subject: Stomach problems    I have been have a problem with my stomach for long since we move to [place] every day I nauseous and quizzy and I don't have appetite and also I diarrhea from time to time I don't know what else to do     Action taken by Care Assist MA:     [facility] agent attempted to contact patient; left voicemail message to call back for triage due to protocol.    ANY AGENT can handle call back.  Please assist patient with scheduling visit if appropriate, follow symptom protocol to determine further action.     Encounter created by Care Assist MA.

## 2020-09-13 NOTE — Telephone Encounter (Signed)
From: Harnoor Badertscher  To: [name], MD  Sent: 09/13/2020 2:03 PM PDT  Subject: Stomach problems    I have been have a problem with my stomach for long since we move to [place] every day I nauseous and quizzy and I don't have appetite and also I diarrhea from time to time I don't know what else to do

## 2020-09-13 NOTE — Telephone Encounter (Signed)
Action taken by Care Assist MA:   Creating symptom telephone encounter per current protocol.   Please refer to symptom telephone encounter created 09/13/20.  Closing MyChart encounter.    Encounter created by Care Assist MA.

## 2020-09-13 NOTE — Telephone Encounter (Signed)
Please assist pt.

## 2020-09-14 NOTE — Telephone Encounter (Signed)
PROVIDER ACTION REQUESTED: No, FYI only   Action item needed:   None   Appt scheduled:   Future Appointments   Date Time Provider [place]   09/18/2020  2:00 PM [name], MD [facility] [place]   10/12/2020  1:00 PM [name], MD [facility] [place]   01/02/2021 10:30 AM Eda Keys, MD [facility] [phone]       Chief Complaint   GI issues since  Feb   Assessment details:  Reports GI issues like  Nausea, loss of appetite, changes in her BM, stomach is queasy since she moved here last Feb.  Had recent hospitalization and was f/up by  Her PCP.  Reports since then she still having issues with nausea even with zofran, episodes of loose stools last week, took some imodium and now shes unable to move her bowel for the last 5 days. No blood in her stool.  No  Pain to her stomach but its queasy , passing gas and no appetite.  No fever reported, pt able to keep fluids in . No weakness reported and breathing is Ok. No covid exposure reported.  Thinking she may need a referral to GI.  Schedule appnt with PCP for f/up.  Advise to call back if symptoms gets worst.  Will route for FYI.    CLINIC/RN/LVN ACTION REQUEST: NO, FYI Only  Action item needed: NO, FYI Only    Appt given w/PCP per pts request and pt given strict ED precautions and reviewed all applicable Home Care Advice per protocol.     If COVID+, is patient interested in Paxlovid or other COVID therapeutics: not related  If COVID+, please advise pt to upload test result.     COVID VACCINATION STATUS: Yes, fully vaccinated  COVID TRIAGE PROTOCOL: NEGATIVE   Symptoms w/in the last 10 days: fever or chills, cough, sob/diff breathing, fatigue, muscle or body aches, headache, loss of taste or smell, sore throat, congestion or runny nose, nausea and vomiting, diarrhea.   Questions:   1. Looking back to the last 10 days, have you been informed by a public health agency or a healthcare agency or a healthcare system that you have been exposed to  [place] (COVID-19)  2. Looking back to the last 10 days, has a person in your household been diagnosed with Coronavirus (COVID-19) infection       Reason for Call: GI Problem (Nausea , loss of appetite . Diarrhea )     Disposition: See PCP Within 2 Weeks     Reason for Disposition   Diarrhea is a chronic symptom (recurrent or ongoing AND present > 4 weeks)    Additional Information   Negative: Shock suspected (e.g., cold/pale/clammy skin, too weak to stand, low BP, rapid pulse)   Negative: Difficult to awaken or acting confused (e.g., disoriented, slurred speech)   Negative: Sounds like a life-threatening emergency to the triager   Negative: Vomiting also present and worse than the diarrhea   Negative: [1] Blood in stool AND [2] without diarrhea   Negative: Diarrhea in a cancer patient who is currently (or recently) receiving chemotherapy or radiation therapy, or cancer patient who has metastatic or end-stage cancer and is receiving palliative care   Negative: [1] SEVERE abdominal pain (e.g., excruciating) AND [2] present > 1 hour   Negative: [1] SEVERE abdominal pain AND [2] age > 60 years   Negative: [1] Blood in the stool AND [2] moderate or large amount of blood  Negative: Black or tarry bowel movements (Exception: chronic-unchanged black-grey bowel movements AND is taking iron pills or Pepto-bismol)   Negative: [1] Drinking very little AND [2] dehydration suspected (e.g., no urine > 12 hours, very dry mouth, very lightheaded)   Negative: Patient sounds very sick or weak to the triager   Negative: [1] SEVERE diarrhea (e.g., 7 or more times / day more than normal) AND [2] age > 60 years   Negative: [1] Constant abdominal pain AND [2] present > 2 hours   Negative: [1] Fever > 103 F (39.4 C) AND [2] not able to get the fever down using Fever Care Advice   Negative: [1] SEVERE diarrhea (e.g., 7 or more times / day more than normal) AND [2] present > 24 hours (1 day)   Negative: [1] MODERATE  diarrhea (e.g., 4-6 times / day more than normal) AND [2] present > 48 hours (2 days)   Negative: [1] MODERATE diarrhea (e.g., 4-6 times / day more than normal) AND [2] age > 70 years   Negative: Fever > 101 F (38.3 C)   Negative: Fever present > 3 days (72 hours)   Negative: Abdominal pain  (Exception: Pain clears with each passage of diarrhea stool)   Negative: [1] Blood in the stool AND [2] small amount of blood   (Exception: only on toilet paper. Reason: diarrhea can cause rectal irritation with blood on wiping)   Negative: [1] Mucus or pus in stool AND [2] present > 2 days AND [3] diarrhea is more than mild   Negative: [1] Recent antibiotic therapy (i.e., within last 2 months) AND [2] diarrhea present > 3 days since antibiotic was stopped   Negative: [1] Recent hospitalization AND [2] diarrhea present > 3 days   Negative: Weak immune system (e.g., HIV positive, cancer chemo, splenectomy, organ transplant, chronic steroids)   Negative: Tube feedings (e.g., nasogastric, g-tube, j-tube)   Negative: Travel to a foreign country in past month   Negative: [1] MILD diarrhea (e.g., 1-3 or more stools than normal in past 24 hours) without known cause AND [2] present >  7 days    Answer Assessment - Initial Assessment Questions  1. DIARRHEA SEVERITY: "How bad is the diarrhea?" "How many extra stools have you had in the past 24 hours than normal?"     - NO DIARRHEA (SCALE 0)    - MILD (SCALE 1-3): Few loose or mushy BMs; increase of 1-3 stools over normal daily number of stools; mild increase in ostomy output.    -  MODERATE (SCALE 4-7): Increase of 4-6 stools daily over normal; moderate increase in ostomy output.  * SEVERE (SCALE 8-10; OR 'WORST POSSIBLE'): Increase of 7 or more stools daily over normal; moderate increase in ostomy output; incontinence.        5x last week , soft BM - taking imodium   2. ONSET: "When did the diarrhea begin?"       On going since feb after she moved here in [place]   3. BM  CONSISTENCY: "How loose or watery is the diarrhea?"         No BM in the last 5 days   4. VOMITING: "Are you also vomiting?" If Yes, ask: "How many times in the past 24 hours?"        None   5. ABDOMINAL PAIN: "Are you having any abdominal pain?" If Yes, ask: "What does it feel like?" (e.g., crampy, dull, intermittent, constant)  No  Pain but feeling very queasy   6. ABDOMINAL PAIN SEVERITY: If present, ask: "How bad is the pain?"  (e.g., Scale 1-10; mild, moderate, or severe)    - MILD (1-3): doesn't interfere with normal activities, abdomen soft and not tender to touch     - MODERATE (4-7): interferes with normal activities or awakens from sleep, tender to touch     - SEVERE (8-10): excruciating pain, doubled over, unable to do any normal activities        None   7. ORAL INTAKE: If vomiting, "Have you been able to drink liquids?" "How much fluids have you had in the past 24 hours?"      Able to keep fluids in   8. HYDRATION: "Any signs of dehydration?" (e.g., dry mouth [not just dry lips], too weak to stand, dizziness, new weight loss) "When did you last urinate?"       Pt is hydrated but has loss of appetite   9. EXPOSURE: "Have you traveled to a foreign country recently?" "Have you been exposed to anyone with diarrhea?" "Could you have eaten any food that was spoiled?"      None   10. ANTIBIOTIC USE: "Are you taking antibiotics now or have you taken antibiotics in the past 2 months?"       Yes - was seen at [facility] last month - was seen for f/up with her PCP.  11. OTHER SYMPTOMS: "Do you have any other symptoms?" (e.g., fever, blood in stool)       None   12. PREGNANCY: "Is there any chance you are pregnant?" "When was your last menstrual period?"      N/a  No covid exposure reported.    Protocols used: [facility]

## 2020-09-14 NOTE — Telephone Encounter (Signed)
Symptom Call          Next office visit:  10/12/2020  List the date of PCP first available:     What symptom is the patient experiencing? Nausea, feeling quesy, diarrhea and loss of appetite      Name of PCP Provider: [name]   Insurance Coverage Verified: Active- in network  Last office visit: 08/21/2020    Who is reporting the symptoms? Incoming call from patient    Is this a new or ongoing symptom? new  Estimated time since experiencing symptom(s)?     Best way to contact patient: [phone] (mobile)   P CARE NAV TRIAGE POOL [ [id number] ]    *Cold transfer to triage nurse to extension #[id number]

## 2020-09-17 ENCOUNTER — Encounter (INDEPENDENT_AMBULATORY_CARE_PROVIDER_SITE_OTHER): Payer: Self-pay | Admitting: Hospital

## 2020-09-18 ENCOUNTER — Ambulatory Visit (INDEPENDENT_AMBULATORY_CARE_PROVIDER_SITE_OTHER): Payer: Medicare Other | Admitting: Student in an Organized Health Care Education/Training Program

## 2020-09-18 ENCOUNTER — Other Ambulatory Visit (INDEPENDENT_AMBULATORY_CARE_PROVIDER_SITE_OTHER): Payer: Self-pay | Admitting: Student in an Organized Health Care Education/Training Program

## 2020-09-18 ENCOUNTER — Telehealth (INDEPENDENT_AMBULATORY_CARE_PROVIDER_SITE_OTHER): Payer: Self-pay

## 2020-09-18 ENCOUNTER — Encounter (INDEPENDENT_AMBULATORY_CARE_PROVIDER_SITE_OTHER): Payer: Self-pay | Admitting: Student in an Organized Health Care Education/Training Program

## 2020-09-18 VITALS — BP 123/88 | HR 112 | Temp 98.1°F | Resp 20 | Wt 213.0 lb

## 2020-09-18 DIAGNOSIS — E782 Mixed hyperlipidemia: Secondary | ICD-10-CM

## 2020-09-18 DIAGNOSIS — G8929 Other chronic pain: Secondary | ICD-10-CM

## 2020-09-18 DIAGNOSIS — R110 Nausea: Secondary | ICD-10-CM

## 2020-09-18 DIAGNOSIS — B379 Candidiasis, unspecified: Secondary | ICD-10-CM

## 2020-09-18 DIAGNOSIS — E119 Type 2 diabetes mellitus without complications: Secondary | ICD-10-CM

## 2020-09-18 DIAGNOSIS — Z23 Encounter for immunization: Secondary | ICD-10-CM

## 2020-09-18 DIAGNOSIS — G894 Chronic pain syndrome: Secondary | ICD-10-CM

## 2020-09-18 DIAGNOSIS — Z794 Long term (current) use of insulin: Secondary | ICD-10-CM

## 2020-09-18 DIAGNOSIS — Z124 Encounter for screening for malignant neoplasm of cervix: Secondary | ICD-10-CM

## 2020-09-18 MED ORDER — ATORVASTATIN CALCIUM 40 MG OR TABS
40.0000 mg | ORAL_TABLET | Freq: Every day | ORAL | 3 refills | Status: DC
Start: 2020-09-18 — End: 2021-07-10

## 2020-09-18 MED ORDER — METOCLOPRAMIDE HCL 5 MG OR TABS
5.0000 mg | ORAL_TABLET | Freq: Four times a day (QID) | ORAL | 1 refills | Status: DC
Start: 2020-09-18 — End: 2020-10-12

## 2020-09-18 MED ORDER — FLUCONAZOLE 150 MG OR TABS
150.0000 mg | ORAL_TABLET | Freq: Once | ORAL | 0 refills | Status: DC
Start: 2020-09-18 — End: 2020-10-08

## 2020-09-18 NOTE — Telephone Encounter (Signed)
GI Nurse Referral Review: Clinic    Referring provider: [name], MD Primary Care    Type 2 diabetes mellitus without complication, with long-term current use of insulin (CMS-HCC)  Nausea  Chronic abdominal pain    Urgency: Routine (Within 4 WKS or at Pt Convenience)    Appointment For: Motility: Colon    Please use the "General GI" option for patients with a primary diagnosis of C. diff or chronic anemia.     Provider: First Available Physician        Pertinent Medical Hx associated with referral: 6/28 Dr. Jeannette How  Nausea, GI upset  Weight loss ~15 lb since LV  Appetite loss  Forces food down, takes Ensure/smoothies with protein  Takes Zofran, helps a bit  Was taking Reglan per last hospitalization, states works well for her  +cramping, bloating  Denies abd pain, dysuria, hematuria, f/c/ns, bloody stools, diarrhea

## 2020-09-18 NOTE — Assessment & Plan Note (Signed)
Likely from chronic DM/gastroparesis  -Gastric emptying study  -GI eval  -Stop Zofran, trial reglan  -Pending f/u with CDE/Endo for optimal diabetes control  -Return precautions

## 2020-09-18 NOTE — Assessment & Plan Note (Addendum)
Reviewed lipid panel  previously on Atorvastatin, stoppedas she ran out.Takes ASA 81  High ASCVD  -Resume Atorvastatin 40, advised to start in 1 week once nausea improves

## 2020-09-18 NOTE — Assessment & Plan Note (Addendum)
T2DMw/peripheralneuropathies, occasional blurry vision-last A1c8.4% (06/2020). On Lantus 48, Novolog 20-17-12 TID plusISS,Jardiance 25 mg daily. Last eye exam >10 years ago, pending repeat eye exam, Endo/CDE referral. Elevated in the setting of chronic steroids, now tapering off  Last eye exam >10 years ago.    -Discussed importance of BG management for prevention of micro/macrovascular complications.  -Discussed management of hypoglycemia.  -Continue current regimen  -Cont ARB  -Resume statin  -Referral to Endocrinology and CDE for further support, referral placed  -Would benefit from GLP1 to aid in weight loss  -Continue annual eye exams, referral placed   -MFT today wnl  -F/u as planned

## 2020-09-18 NOTE — Progress Notes (Signed)
Internal Medicine Clinic Progress Note       Chief Complaint   Patient presents with    GI Problem     Nausea, loss of appetite upset GI. Also notice having multiple lumps currently now under left arm        Subjective:      Kimberly Curtis is a 65 year old female here for follow-up.    Patient consent was acquired.    Nausea, GI upset  Weight loss ~15 lb since LV  Appetite loss  Forces food down, takes Ensure/smoothies with protein  Takes Zofran, helps a bit  Was taking Reglan per last hospitalization, states works well for her  +cramping, bloating  Denies abd pain, dysuria, hematuria, f/c/ns, bloody stools, diarrhea    History:  Sarcoidosisc/b frequent PNA/bronchitis s/p bronchial stent-previously on chronic steroids, tapered off Prednisone 10 mg,Albuterol neb PRN, MDI uses more frequently, montelukast 10 mg daily, Trelegy daily. Had bronchial stents recently taken out on 04/30/2020 in [place]. Last CT scan 04/2020. Recently seen by [facility], pending CT chest. On 3L 02 continuous  Chronic coughcausingB/l abd/thoracic pain-intermittent. Previously prescribed Tussionex, requesting refill on this  T2DMw/peripheralneuropathies, occasional blurry vision-last A1c8.4% (06/2020). On Lantus 48, Novolog 20-17-12 TID plusISS,Jardiance 25 mg daily. Last eye exam >10 years ago, pending repeat eye exam, Endo/CDE referral. Elevated in the setting of chronic steroids, now tapering off   HTN-on Losartan 100, Lasix 20 BID. Checks BP at home, 120/70s  HLD-previously on Atorvastatin, stoppedas she ran out.Takes ASA 81  GERD-Protonix 40 mg daily, Pepcid 40 daily  Obesity, BMI44  Chronic pain, OA of knees/back, sarcoid pain-on Baclofen 5, Lyrica 75, Amitriptyline 50 mg, Percocet 10 (takes 2-3 doses daily). Stopped Tramadol. Has Narcan at home, never used. Seeing pain specialist in [place] 180 daily  Itching-Hydroxyzine 50 mg PRN  Constipation-BM every 1-3 days, has had more diarrhea since moving to  [place].  S/p hysterectomy-right ovary intact, done in 1996  S/p L-knee surgery-2003, torn meniscus, ACL repair    HCM  CRC-colonoscopy done ~2019, found diverticulosis  MAWV  MAMMO-done >2-3 years ago, normal. Ordered 4/18  DM-foot/eye    Social Hx:  Retired, previous cook now on disability. Has 4 children. Stopped tobacco in 1996 (10 pack year history). Denies alcohol use.    Assessment and Plan:     Problem List Items Addressed This Visit        Cardiovascular    Mixed hyperlipidemia    Current Assessment & Plan     Reviewed lipid panel  previously on Atorvastatin, stoppedas she ran out.Takes ASA 81  High ASCVD  -Resume Atorvastatin 40, advised to start in 1 week once nausea improves           Relevant Medications    atorvastatin (LIPITOR) 40 MG tablet       GI and Hepatology    Nausea - Primary    Current Assessment & Plan     Likely from chronic DM/gastroparesis  -Gastric emptying study  -GI eval  -Stop Zofran, trial reglan  -Pending f/u with CDE/Endo for optimal diabetes control  -Return precautions           Relevant Medications    metoclopramide (REGLAN) 5 MG tablet    Other Relevant Orders    Gastroenterology Clinic    Nuc Med Gastric Emptying Study       Endocrine    Type 2 diabetes mellitus without complication, with long-term current use of insulin (CMS-HCC)  Current Assessment & Plan     T2DMw/peripheralneuropathies, occasional blurry vision-last A1c8.4% (06/2020). On Lantus 48, Novolog 20-17-12 TID plusISS,Jardiance 25 mg daily. Last eye exam >10 years ago, pending repeat eye exam, Endo/CDE referral. Elevated in the setting of chronic steroids, now tapering off  Last eye exam >10 years ago.    -Discussed importance of BG management for prevention of micro/macrovascular complications.  -Discussed management of hypoglycemia.  -Continue current regimen  -Cont ARB  -Resume statin  -Referral to Endocrinology and CDE for further support, referral placed  -Would benefit from GLP1 to aid in weight  loss  -Continue annual eye exams, referral placed   -MFT today wnl  -F/u as planned           Relevant Orders    Diabetic foot exam (Completed)    Ophthalmology Clinic    Gastroenterology Clinic    Nuc Med Gastric Emptying Study      Other Visit Diagnoses     Need for vaccination        Pap smear for cervical cancer screening        Chronic abdominal pain        Relevant Orders    Gastroenterology Clinic    Yeast infection        Relevant Medications    fluCONazole (DIFLUCAN) 150 MG tablet            Medication Review:  Medications reviewed with patient and medication list reconciled.  Over the counter medications, herbal therapies and supplements reviewed.  Patient's understanding and response to medications assessed.   Barriers to medications assessed and addressed.   Risks, benefits, alternatives to medications reviewed.  No barriers to learning, verbalizes understanding of teaching and instructions.    Medications at the end of this encounter:  Current Outpatient Medications   Medication Sig Dispense Refill    albuterol (PROVENTIL) (2.5 MG/3ML) 0.083% nebulization 3 mL (2.5 mg) by Nebulization route every 4 hours as needed for Wheezing. 1 each 2    Albuterol Sulfate 108 (90 Base) MCG/ACT AEPB Inhale 1 puff by mouth as needed.      amitriptyline (ELAVIL) 50 MG tablet Take 1 tablet (50 mg) by mouth nightly. 90 tablet 1    aspirin 81 MG EC tablet Take 1 tablet (81 mg) by mouth daily. 90 tablet 1    atorvastatin (LIPITOR) 40 MG tablet Take 1 tablet (40 mg) by mouth daily. 90 tablet 3    Baclofen 5 MG TABS Take 1 tablet (5 mg) by mouth in the morning and at bedtime. 60 tablet 2    empagliflozin (JARDIANCE) 25 mg tablet Take 1 tablet (25 mg) by mouth daily. 90 tablet 1    famotidine (PEPCID) 40 MG tablet Take 1 tablet (40 mg) by mouth daily. 90 tablet 1    fexofenadine (ALLEGRA) 180 MG tablet Take 1 tablet (180 mg) by mouth daily. 90 tablet 1    fluCONazole (DIFLUCAN) 150 MG tablet Take 1 tablet (150 mg) by  mouth once for 1 dose. Can repeat additional dose in 72 hours after. 2 tablet 0    fluticasone-umeclidinium-vilanterol (TRELEGY ELLIPTA) 100-62.5-25 MCG/INH AEPB Inhale 1 puff by mouth daily.      folic acid (FOLVITE) 1 MG tablet Take 1 tablet (1 mg) by mouth daily. 90 tablet 1    furosemide (LASIX) 20 MG tablet Take 1 tablet (20 mg) by mouth 2 times daily. 60 tablet 3    Hydrocod Polst-CPM Polst ER 10-8 MG/5ML SUER Take 5  mL by mouth every 12 hours as needed (cough). 240 mL 0    HYDROCODONE-CHLORPHENIRAMINE PO 5 mL. Every 12 hours as needed for cough      hydrOXYzine HCL (ATARAX) 50 MG tablet TAKE 1 TABLET BY MOUTH 2 TIMES DAILY AS NEEDED FOR ITCHING 180 tablet 1    insulin glargine (LANTUS SOLOSTAR) 100 units/mL injection pen Inject 48 Units under the skin nightly. 15 mL 2    levoFLOXacin (LEVAQUIN) 750 MG tablet Take 1 tablet (750 mg) by mouth daily. 10 tablet 0    losartan (COZAAR) 100 MG tablet Take 1 tablet (100 mg) by mouth daily. 90 tablet 1    metoclopramide (REGLAN) 5 MG tablet Take 1 tablet (5 mg) by mouth 4 times daily (before meals and nightly). 120 tablet 1    montelukast (SINGULAIR) 10 MG tablet Take 1 tablet (10 mg) by mouth every evening. 90 tablet 1    naloxone (NARCAN) 4 mg/0.1 mL nasal spray Spray 1 spray into one nostril once as needed.      NOVOLOG FLEXPEN 100 UNIT/ML injection pen Inject 20 Units under the skin 3 times daily as needed for High Blood Sugar. AM: 20 units + sliding scale Mid day: 17 + sliding scale and PM 12 + Sliding scale 15 mL 2    ondansetron (ZOFRAN ODT) 8 MG disintegrating tablet Take 1 tablet (8 mg) on or under the tongue every 8 hours as needed for Nausea/Vomiting. 30 tablet 0    oxycodone-acetaminophen (PERCOCET) 10-325 MG tablet Take 1 tablet by mouth every 8 hours as needed for Severe Pain (Pain Score 7-10). 90 tablet 0    pantoprazole (PROTONIX) 40 MG tablet Take 1 tablet (40 mg) by mouth every morning (before breakfast). 90 tablet 1    predniSONE  (DELTASONE) 10 MG tablet Take 1 tablet (10 mg) by mouth daily. 90 tablet 1    pregabalin (LYRICA) 75 MG capsule Take 1 capsule (75 mg) by mouth 2 times daily. 1 tablet AM and 2 tablets PM 60 capsule 2    TECHLITE PEN NEEDLES 32G X 4 MM MISC Use one pen needle with each insulin administration. 300 each 0     No current facility-administered medications for this visit.       Objective:     Vitals:    09/18/20 1335   BP: 123/88   BP Location: Right arm   BP Patient Position: Sitting   BP cuff size: Large   Pulse: 112   Resp: 20   Temp: 98.1 F (36.7 C)   TempSrc: Temporal   SpO2: 99%   Weight: 96.6 kg (213 lb)       PHYSICAL EXAMINATION   GEN - WDWN, NAD  HEENT - AT/NC  NECK - Supple  GI - Soft, NT/ND, normal BS, no rebound/guarding  EXT -  Warm without edema   DIABETIC FOOT:  External appearance: normal, no skin breakdown, lesions  Blood flow: normal, 2+ in PT/DP bilaterally  Sensation: normal sensation bilaterally to monofilament testing    Labs and Imaging:     Lab Results   Component Value Date    WBC 10.3 (H) 07/09/2020    RBC 5.36 (H) 07/09/2020    HGB 12.7 07/09/2020    HCT 44.0 07/09/2020    MCV 82.1 07/09/2020    MCHC 28.9 (L) 07/09/2020    RDW 16.5 (H) 07/09/2020    PLT 395 (H) 07/09/2020    MPV 10.2 07/09/2020       Lab  Results   Component Value Date    BUN 11 07/09/2020    BUN 11 07/09/2020    CREAT 1.07 (H) 07/09/2020    CREAT 1.07 (H) 07/09/2020    CL 98 07/09/2020    CL 98 07/09/2020    NA 144 07/09/2020    NA 144 07/09/2020    K 3.7 07/09/2020    K 3.7 07/09/2020    [place] 10.4 07/09/2020    [place] 10.4 07/09/2020    TBILI 0.36 07/09/2020    TBILI 0.36 07/09/2020    ALB 4.6 07/09/2020    ALB 4.6 07/09/2020    TP 6.9 07/09/2020    TP 6.9 07/09/2020    AST 21 07/09/2020    AST 21 07/09/2020    ALK 112 (H) 07/09/2020    ALK 112 (H) 07/09/2020    BICARB 32 (H) 07/09/2020    BICARB 32 (H) 07/09/2020    ALT 22 07/09/2020    ALT 22 07/09/2020    GLU 183 (H) 07/09/2020    GLU 183 (H) 07/09/2020       No results  found.      Followup:       No follow-ups on file.    Future Appointments   Date Time Provider [place]   09/18/2020  1:40 PM [name], MD [facility]   10/12/2020  1:00 PM [name], MD E[facility]   01/02/2021 10:30 AM Eda Keys, MD [facility] [id number]               [name], MD  [place]  Internal Medicine

## 2020-09-19 MED ORDER — PREGABALIN 75 MG OR CAPS
75.0000 mg | ORAL_CAPSULE | Freq: Two times a day (BID) | ORAL | 2 refills | Status: DC
Start: 2020-09-19 — End: 2020-12-18

## 2020-09-19 MED ORDER — BACLOFEN 5 MG PO TABS
5.0000 mg | ORAL_TABLET | Freq: Two times a day (BID) | ORAL | 2 refills | Status: DC
Start: 2020-09-19 — End: 2020-10-12

## 2020-09-19 NOTE — Telephone Encounter (Signed)
Kimberly Repress, MD  You; Horris Latino Scheduling 20 hours ago (5:43 PM)         Agree - okay to schedule with General GI, thanks    Message text       Updated referral shell.

## 2020-09-19 NOTE — Telephone Encounter (Signed)
Established with: Kimberly Curtis   Last OV with PCP: 09/18/2020   Next OV with PCP: 10/12/2020       Requested Medication(s):  Requested Prescriptions     Pending Prescriptions Disp Refills   . Baclofen 5 MG TABS 60 tablet 2     Sig: Take 1 tablet (5 mg) by mouth in the morning and at bedtime.   . pregabalin (LYRICA) 75 MG capsule 60 capsule 2     Sig: Take 1 capsule (75 mg) by mouth 2 times daily. 1 tablet AM and 2 tablets PM       Send to:   [id number] - [place], [place] - [address] [place] [address]  Phone: [phone] Fax: [phone]       Last labs:   Lab Results   Component Value Date    CHOL 335 (H) 07/09/2020    HDL 48 07/09/2020    LDLCALC 229 (H) 07/09/2020    TRIG 288 (H) 07/09/2020    TSH 1.29 07/09/2020    A1C 8.4 (H) 07/09/2020        Blood Pressure   09/18/20 123/88   08/27/20 146/70   07/19/20 (!) 155/94        Health Maintenance Due   Topic Date Due   . Breast Cancer Screen  Never done   . Diabetic Retinal Exam  Never done   . Pneumococcal Vaccine (1 - PCV) Never done   . Medicare Annual Wellness Visit  Never done

## 2020-09-19 NOTE — Telephone Encounter (Signed)
I ran the patient's name and date-of-birth through the [place] [employer] Prescription Drug Monitoring Program CURES website which will list controlled drug prescriptions filled at [place] pharmacies in the past 12 months. The information in the patient's CURES report is consistent with their self-report and shows no evidence of controlled substance abuse or doctor shopping.

## 2020-09-20 ENCOUNTER — Telehealth (INDEPENDENT_AMBULATORY_CARE_PROVIDER_SITE_OTHER): Payer: Self-pay | Admitting: Student in an Organized Health Care Education/Training Program

## 2020-09-20 NOTE — Telephone Encounter (Signed)
Sent message with mammogram information

## 2020-09-25 ENCOUNTER — Other Ambulatory Visit: Payer: Self-pay

## 2020-09-25 ENCOUNTER — Other Ambulatory Visit (INDEPENDENT_AMBULATORY_CARE_PROVIDER_SITE_OTHER): Payer: Self-pay | Admitting: Student in an Organized Health Care Education/Training Program

## 2020-09-25 DIAGNOSIS — G894 Chronic pain syndrome: Secondary | ICD-10-CM

## 2020-09-28 MED ORDER — OXYCODONE-ACETAMINOPHEN 10-325 MG OR TABS
1.0000 | ORAL_TABLET | Freq: Three times a day (TID) | ORAL | 0 refills | Status: DC | PRN
Start: 2020-09-28 — End: 2021-01-14

## 2020-09-28 NOTE — Telephone Encounter (Signed)
Established with:  Last OV with PCP:  Next OV with Dept:  Future Appointments: [name]   09/18/2020  10/12/2020  Future Appointments   Date Time Provider [place]   10/12/2020  1:00 PM [name], MD [facility]   01/02/2021 10:30 AM Eda Keys, MD [facility] [address] [phone]        Medication requested:   Requested Prescriptions     Pending Prescriptions Disp Refills    oxycodone-acetaminophen (PERCOCET) 10-325 MG tablet 90 tablet 0     Sig: Take 1 tablet by mouth every 8 hours as needed for Severe Pain (Pain Score 7-10).       Last Filled Date 07/09/20   Quantity Last Filled  90   Refill  X 0   Send to:    [facility]  [address] [place] [address]  Phone: [phone] Fax: [phone]    [id number] [address] [place], [place] - [address] [place] [address]  Phone: [phone] Fax: [phone]      Current Medication(s):  Current Outpatient Medications   Medication Sig Dispense Refill    albuterol (PROVENTIL) (2.5 MG/3ML) 0.083% nebulization 3 mL (2.5 mg) by Nebulization route every 4 hours as needed for Wheezing. 1 each 2    Albuterol Sulfate 108 (90 Base) MCG/ACT AEPB Inhale 1 puff by mouth as needed.      amitriptyline (ELAVIL) 50 MG tablet Take 1 tablet (50 mg) by mouth nightly. 90 tablet 1    aspirin 81 MG EC tablet Take 1 tablet (81 mg) by mouth daily. 90 tablet 1    atorvastatin (LIPITOR) 40 MG tablet Take 1 tablet (40 mg) by mouth daily. 90 tablet 3    Baclofen 5 MG TABS Take 1 tablet (5 mg) by mouth in the morning and at bedtime. 60 tablet 2    empagliflozin (JARDIANCE) 25 mg tablet Take 1 tablet (25 mg) by mouth daily. 90 tablet 1    famotidine (PEPCID) 40 MG tablet Take 1 tablet (40 mg) by mouth daily. 90 tablet 1    fexofenadine (ALLEGRA) 180 MG tablet Take 1 tablet (180 mg) by mouth daily. 90 tablet 1    fluticasone-umeclidinium-vilanterol (TRELEGY ELLIPTA) 100-62.5-25 MCG/INH AEPB Inhale 1  puff by mouth daily.      folic acid (FOLVITE) 1 MG tablet Take 1 tablet (1 mg) by mouth daily. 90 tablet 1    furosemide (LASIX) 20 MG tablet Take 1 tablet (20 mg) by mouth 2 times daily. 60 tablet 3    Hydrocod Polst-CPM Polst ER 10-8 MG/5ML SUER Take 5 mL by mouth every 12 hours as needed (cough). 240 mL 0    HYDROCODONE-CHLORPHENIRAMINE PO 5 mL. Every 12 hours as needed for cough      hydrOXYzine HCL (ATARAX) 50 MG tablet TAKE 1 TABLET BY MOUTH 2 TIMES DAILY AS NEEDED FOR ITCHING 180 tablet 1    insulin glargine (LANTUS SOLOSTAR) 100 units/mL injection pen Inject 48 Units under the skin nightly. 15 mL 2    levoFLOXacin (LEVAQUIN) 750 MG tablet Take 1 tablet (750 mg) by mouth daily. 10 tablet 0    losartan (COZAAR) 100 MG tablet Take 1 tablet (100 mg) by mouth daily. 90 tablet 1    metoclopramide (REGLAN) 5 MG tablet Take 1 tablet (5 mg) by mouth 4 times daily (before meals and nightly). 120 tablet 1    montelukast (SINGULAIR) 10 MG  tablet Take 1 tablet (10 mg) by mouth every evening. 90 tablet 1    naloxone (NARCAN) 4 mg/0.1 mL nasal spray Spray 1 spray into one nostril once as needed.      NOVOLOG FLEXPEN 100 UNIT/ML injection pen Inject 20 Units under the skin 3 times daily as needed for High Blood Sugar. AM: 20 units + sliding scale Mid day: 17 + sliding scale and PM 12 + Sliding scale 15 mL 2    ondansetron (ZOFRAN ODT) 8 MG disintegrating tablet Take 1 tablet (8 mg) on or under the tongue every 8 hours as needed for Nausea/Vomiting. 30 tablet 0    oxycodone-acetaminophen (PERCOCET) 10-325 MG tablet Take 1 tablet by mouth every 8 hours as needed for Severe Pain (Pain Score 7-10). 90 tablet 0    pantoprazole (PROTONIX) 40 MG tablet Take 1 tablet (40 mg) by mouth every morning (before breakfast). 90 tablet 1    predniSONE (DELTASONE) 10 MG tablet Take 1 tablet (10 mg) by mouth daily. 90 tablet 1    pregabalin (LYRICA) 75 MG capsule Take 1 capsule (75 mg) by mouth 2 times daily. 1 tablet AM and  2 tablets PM 60 capsule 2    TECHLITE PEN NEEDLES 32G X 4 MM MISC Use one pen needle with each insulin administration. 300 each 0     No current facility-administered medications for this visit.       Patient information: Allergies   Allergen Reactions    Iodine [[employer]] Anaphylaxis    Sulfa Drugs Anaphylaxis      Health Maintenance Due   Topic Date Due    Breast Cancer Screen  Never done    Diabetic Retinal Exam  Never done    Pneumococcal Vaccine (1 - PCV) Never done    Medicare Annual Wellness Visit  Never done    Diabetes A1c  10/08/2020      Last labs:  Lab Results   Component Value Date    CHOL 335 (H) 07/09/2020    HDL 48 07/09/2020    LDLCALC 229 (H) 07/09/2020    TRIG 288 (H) 07/09/2020    TSH 1.29 07/09/2020    A1C 8.4 (H) 07/09/2020      Blood Pressure   09/18/20 123/88   08/27/20 146/70   07/19/20 (!) 155/94    No components found for: 50M

## 2020-10-03 ENCOUNTER — Other Ambulatory Visit (INDEPENDENT_AMBULATORY_CARE_PROVIDER_SITE_OTHER): Payer: Self-pay | Admitting: Student in an Organized Health Care Education/Training Program

## 2020-10-03 DIAGNOSIS — E119 Type 2 diabetes mellitus without complications: Secondary | ICD-10-CM

## 2020-10-03 DIAGNOSIS — Z794 Long term (current) use of insulin: Secondary | ICD-10-CM

## 2020-10-03 NOTE — Telephone Encounter (Signed)
Diabetes & Nutrition Education Clinic Patient Outreach     Kimberly Curtis is a 65 year old female who I have contacted regarding a referral for the Diabetes & Nutrition Education Program.    Telephonic call placed to inform of eligibility for services, but was unable to reach patient.     Voicemail left with instructions to call back scheduler if interested in services at: [phone]

## 2020-10-04 ENCOUNTER — Telehealth (INDEPENDENT_AMBULATORY_CARE_PROVIDER_SITE_OTHER): Payer: Self-pay | Admitting: Student in an Organized Health Care Education/Training Program

## 2020-10-04 MED ORDER — TECHLITE PEN NEEDLES 32G X 4 MM MISC
11 refills | Status: DC
Start: 2020-10-04 — End: 2021-10-30

## 2020-10-04 NOTE — Telephone Encounter (Signed)
Who is calling: Ancillary: Clarene Critchley from [facility] is calling on behalf of patient  Insurance Coverage Verified: Active- in network    Reason for this call: Clarene Critchley from [facility] is calling to inform Dr. Jeannette How that the pt has not yet been seen by home health due to the pt still being hospitalized.     Action required by office: No action needed. Routing to clinic as Colin Rhein way to contact: [phone]    Duplicate encounter? No previous documentation found on this issue.  Inquiry has been read verbatim to this caller. Verbalizes satisfaction and confirms the above is accurate: yes    Caller has been advised this message will be transmitted to office and can expect a response within the next 24-72 hours.

## 2020-10-05 ENCOUNTER — Telehealth (INDEPENDENT_AMBULATORY_CARE_PROVIDER_SITE_OTHER): Payer: Self-pay | Admitting: Student in an Organized Health Care Education/Training Program

## 2020-10-05 NOTE — Telephone Encounter (Signed)
Who is calling: Ancillary: Helene Kelp, RN  from [facility]  is calling on behalf of patient     Insurance Coverage Verified: Active- in network     Reason for this call: Pt was admitted to [facility] today (10/05/20)  after hospital discharge.     Pt will be assigned the following disciplines:    Skilled Nursing, Physical/Therapy, Occupational/Therapy, SLP - Speech Therpay, Medical SW, and [place] aide - for bathing.     Action required by office: No action needed. Routing to clinic as FYI    Duplicate encounter? No previous documentation found on this issue.     Best way to contact: [phone]    Alternative:         Inquiry has been read verbatim to this caller. Verbalizes satisfaction and confirms the above is accurate: yes      Has been advised this message will be transmitted to office and can expect a response within the next 24-72 hours.

## 2020-10-06 ENCOUNTER — Other Ambulatory Visit (INDEPENDENT_AMBULATORY_CARE_PROVIDER_SITE_OTHER): Payer: Self-pay | Admitting: Student in an Organized Health Care Education/Training Program

## 2020-10-06 DIAGNOSIS — B379 Candidiasis, unspecified: Secondary | ICD-10-CM

## 2020-10-08 MED ORDER — FLUCONAZOLE 150 MG OR TABS
150.0000 mg | ORAL_TABLET | Freq: Once | ORAL | 0 refills | Status: DC
Start: 2020-10-08 — End: 2020-10-12

## 2020-10-12 ENCOUNTER — Encounter (INDEPENDENT_AMBULATORY_CARE_PROVIDER_SITE_OTHER): Payer: Medicare Other | Admitting: Student in an Organized Health Care Education/Training Program

## 2020-10-12 ENCOUNTER — Encounter (INDEPENDENT_AMBULATORY_CARE_PROVIDER_SITE_OTHER): Payer: Self-pay | Admitting: Student in an Organized Health Care Education/Training Program

## 2020-10-12 ENCOUNTER — Ambulatory Visit (INDEPENDENT_AMBULATORY_CARE_PROVIDER_SITE_OTHER): Payer: Medicare Other | Admitting: Student in an Organized Health Care Education/Training Program

## 2020-10-12 VITALS — BP 150/62 | HR 63 | Temp 98.3°F | Resp 20

## 2020-10-12 DIAGNOSIS — E782 Mixed hyperlipidemia: Secondary | ICD-10-CM

## 2020-10-12 DIAGNOSIS — Z794 Long term (current) use of insulin: Secondary | ICD-10-CM

## 2020-10-12 DIAGNOSIS — Z950 Presence of cardiac pacemaker: Secondary | ICD-10-CM | POA: Insufficient documentation

## 2020-10-12 DIAGNOSIS — Z23 Encounter for immunization: Secondary | ICD-10-CM

## 2020-10-12 DIAGNOSIS — G894 Chronic pain syndrome: Secondary | ICD-10-CM

## 2020-10-12 DIAGNOSIS — E559 Vitamin D deficiency, unspecified: Secondary | ICD-10-CM

## 2020-10-12 DIAGNOSIS — E119 Type 2 diabetes mellitus without complications: Secondary | ICD-10-CM

## 2020-10-12 DIAGNOSIS — T7840XA Allergy, unspecified, initial encounter: Secondary | ICD-10-CM

## 2020-10-12 DIAGNOSIS — Z09 Encounter for follow-up examination after completed treatment for conditions other than malignant neoplasm: Secondary | ICD-10-CM

## 2020-10-12 DIAGNOSIS — F419 Anxiety disorder, unspecified: Secondary | ICD-10-CM

## 2020-10-12 DIAGNOSIS — Z0000 Encounter for general adult medical examination without abnormal findings: Secondary | ICD-10-CM

## 2020-10-12 MED ORDER — METOPROLOL TARTRATE 25 MG OR TABS
25.0000 mg | ORAL_TABLET | Freq: Two times a day (BID) | ORAL | Status: DC
Start: 2020-10-04 — End: 2020-11-02

## 2020-10-12 MED ORDER — ALPRAZOLAM 0.5 MG OR TABS
ORAL_TABLET | ORAL | Status: DC
Start: 2020-10-09 — End: 2020-11-01

## 2020-10-12 MED ORDER — FOLIC ACID 1 MG OR TABS
1.0000 mg | ORAL_TABLET | Freq: Every day | ORAL | 1 refills | Status: DC
Start: 2020-10-12 — End: 2021-02-06

## 2020-10-12 MED ORDER — SPIRONOLACTONE 25 MG OR TABS
ORAL_TABLET | ORAL | Status: DC
Start: 2020-10-09 — End: 2020-11-06

## 2020-10-12 NOTE — Assessment & Plan Note (Signed)
Chronic pain, OA of knees/back, sarcoid pain-on Lyrica 75/150, Percocet 10 (takes 2-3 doses daily - MME 45). Stopped Gabapentin, Tramadol, Baclofen, and Amitryptalile due to qtc. Has Narcan at home, never used. Previously seeing pain specialist in [place] current medication regimen  -Meds reviewed and reconciled in office today  -advised to STOP Xanax and Oxycontin 10 ER that was prescribed at recent admission, explained risks for oversedation and falls  -CURES reviewed  -Pain contract, urine tox per [place]  -Referral to Pain medicine for optimization

## 2020-10-12 NOTE — Progress Notes (Signed)
Internal Medicine Clinic Progress Note       Chief Complaint   Patient presents with    Follow Up     Hospitalization for CP       Subjective:      Kimberly Curtis is a 65 year old female here for follow-up.    Patient consent was acquired.  Presents with Husband Kimberly Curtis today    Recently admitted to [facility] done, no stents placed   Days after pt admitted for VFIB/TACH/prolonged QTC s/p DC AICD placement 7/16  Discharged 7/20 from [place] NOTE:  This 65 year old female who presented for multiple AICD shocks, patient had AICD interrogation which showed multiple episodes of V. fib. She had prolonged QTc interval. Patient was recently discharged on ciprofloxacin for Pseudomonas UTI. She was also taking Reglan intermittently for diabetic gastroparesis. All the medications were held, potassium and magnesium was replaced and she was closely monitored. She was seen by cardiology while in-house. She had no further episodes of ventricular tachycardia. Given the suspicion for cardiac sarcoidosis she was started on IV steroids. Today on the day of discharge patient is otherwise doing well, she is very anxious to go home as this is her third admission in this month. 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 She is treated for uncomplicated cystitis with Pseudomonas with 3 days of cefepime. She is asked to continue current medications and follow-up with PCP in 1 to 2 weeks.    Taken off Losartan, Lasix, Amitryptiline, Reglan  New meds: Metoprolol, Spironolactone, Xanax 0.5 PRN, Oxydontin 10 ER      On Prednisone 20 mg x30 days since DC    Feels tired and weak today  Planning to see Cardiologist at [facility] next week, with plans to transfer to [facility] Cardiology team    [place] has not scheduled Endo or GI appts.      History:  Sarcoidosisc/b frequent PNA/bronchitis  s/p bronchial stent-previously onchronic steroids,tapered offPrednisone 10 mg,Albuterol neb PRN, MDI uses more frequently, montelukast 10 mg daily, Trelegy daily. Had bronchial stents recently taken out on 04/30/2020 in [place]. Last CT scan 04/2020.Following with [facility], pending CT chest. On 3L 02continuous  Chronic coughcausingB/l abd/thoracic pain-intermittent. Previously prescribed Tussionex, requesting refill on this  T2DMw/peripheralneuropathies, occasional blurry vision-last A1c8.4%, UACR UTC (06/2020). On Lantus 48, Novolog 20-17-12TID plusISS,(stopped Jardiance 25 mg daily due to yeast infections, stopped glipizide, metformin cause GI issues). Last eye exam >10 years ago, pending repeat eye exam, Endo/CDE referral.Elevated in the setting of chronic steroids  HTN-on Metoprolol 25 BID, spironolactone 25 daily (stopped Lasix 20 and Losartan per last admission). Checks BP at home, 120/70s   HLD-previous Lipid panel high I/s/o stopping medications. Resumed Lipitor 40 per [facility]  GERD-Protonix 40 mg daily, Pepcid 40 daily  Nausea, GI upset-pending gastric emptying study, stopped Reglan due to QTC  Obesity, BMI40  Chronic pain, OA of knees/back, sarcoid pain-on Baclofen 5 (stopped), Lyrica 75/150, Percocet 10 (takes 2-3 doses daily - MME 45). Stopped Gabapentin (stopped), Tramadol (stopped), and Amitryptalile due to qtc. Has Narcan at home, never used. Seeing pain specialist in [place]  Anxiety-Xanax PRN   Allergies-Allegra 180 daily  Itching-Hydroxyzine 50 mg PRN (not using)  Constipation-BM every 1-3 days, has had more diarrhea since moving to [place].  S/p hysterectomy-right ovary intact, done in [place]  S/p L-knee surgery-2003,  torn meniscus, ACL repair    HCM  CRC-colonoscopy done ~2019, found diverticulosis  MAWV  MAMMO-done >2-3 years ago, normal. Ordered 4/18  DM-foot/eye    Social Hx:  Retired, previous cook now on disability. Has 4 children. Stopped tobacco in 1996 (10 pack year history).  Denies alcohol use.    Assessment and Plan:     Problem List Items Addressed This Visit        Cardiovascular    Mixed hyperlipidemia    Relevant Orders    Lipid Panel Green Plasma Separator Tube       GI and Hepatology    Vitamin D deficiency    Relevant Orders    Vitamin D, 25-OH Total Yellow serum separator tube       Endocrine    Type 2 diabetes mellitus without complication, with long-term current use of insulin (CMS-HCC)    Current Assessment & Plan     T2DMw/peripheralneuropathies, occasional blurry vision-last A1c8.4%, UACR UTC (06/2020). On Lantus 48, Novolog 20-17-12TID plusISS,(stopped Jardiance 25 mg daily due to yeast infections, stopped glipizide, metformin cause GI issues). Last eye exam >10 years ago, pending repeat eye exam, Endo/CDE referral.Elevated in the setting of chronic steroids    -Discussed importance of BG management for prevention of micro/macrovascular complications.  -Continue current regimen  -Cont ARB/statin  -Referral to Endocrinology and CDE for further support, referral placed per [facility] and advised to schedule  -F/u with Pulm to discuss steroid taper  -will discuss adding GLP-1 per [facility], would benefit with weight loss  -Continue annual eye exams, referral placed l  -F/u as planned           Relevant Orders    Glycosylated Hgb(A1C), Blood Lavender       Other    Chronic pain syndrome    Current Assessment & Plan     Chronic pain, OA of knees/back, sarcoid pain-on Lyrica 75/150, Percocet 10 (takes 2-3 doses daily - MME 45). Stopped Gabapentin, Tramadol, Baclofen, and Amitryptalile due to qtc. Has Narcan at home, never used. Previously seeing pain specialist in [place] current medication regimen  -Meds reviewed and reconciled in office today  -advised to STOP Xanax and Oxycontin 10 ER that was prescribed at recent admission, explained risks for oversedation and falls  -CURES reviewed  -Pain contract, urine tox per [facility]  -Referral to Pain medicine for optimization            Relevant Orders    Consult/Referral to [place]     Worse since recent admission  -pt was discharged with Xanax  -advised to stop given use of opioids  -e-con to Psych to assist with medication optimizations to best manage anxiety (avoiding BZDs or other meds that prolong QTC)  -follow-up as planned           Relevant Orders    Consult/Referral to Psychiatry    eConsult Psychiatry - Anxiety (Completed)    Allergy, initial encounter    Relevant Medications    folic acid (FOLVITE) 1 MG tablet      Other Visit Diagnoses     Hospital discharge follow-up    -  Primary    Need for vaccination        Laboratory exam ordered as part of routine general medical examination        Relevant Orders    CBC w/ Diff Lavender    Comprehensive Metabolic Panel (  CMP)          HCM      Medication Review:  Medications reviewed with patient and medication list reconciled.  Over the counter medications, herbal therapies and supplements reviewed.  Patient's understanding and response to medications assessed.   Barriers to medications assessed and addressed.   Risks, benefits, alternatives to medications reviewed.  No barriers to learning, verbalizes understanding of teaching and instructions.    Medications at the end of this encounter:  Current Outpatient Medications   Medication Sig Dispense Refill    albuterol (PROVENTIL) (2.5 MG/3ML) 0.083% nebulization 3 mL (2.5 mg) by Nebulization route every 4 hours as needed for Wheezing. 1 each 2    Albuterol Sulfate 108 (90 Base) MCG/ACT AEPB Inhale 1 puff by mouth as needed.      ALPRAZolam (XANAX) 0.5 MG tablet       aspirin 81 MG EC tablet Take 1 tablet (81 mg) by mouth daily. 90 tablet 1    atorvastatin (LIPITOR) 40 MG tablet Take 1 tablet (40 mg) by mouth daily. 90 tablet 3    famotidine (PEPCID) 40 MG tablet Take 1 tablet (40 mg) by mouth daily. 90 tablet 1    fexofenadine (ALLEGRA) 180 MG tablet Take 1 tablet (180 mg) by mouth daily. 90  tablet 1    fluticasone-umeclidinium-vilanterol (TRELEGY ELLIPTA) 100-62.5-25 MCG/INH AEPB Inhale 1 puff by mouth daily.      folic acid (FOLVITE) 1 MG tablet Take 1 tablet (1 mg) by mouth daily. 90 tablet 1    Hydrocod Polst-CPM Polst ER 10-8 MG/5ML SUER Take 5 mL by mouth every 12 hours as needed (cough). 240 mL 0    HYDROCODONE-CHLORPHENIRAMINE PO 5 mL. Every 12 hours as needed for cough      hydrOXYzine HCL (ATARAX) 50 MG tablet TAKE 1 TABLET BY MOUTH 2 TIMES DAILY AS NEEDED FOR ITCHING 180 tablet 1    insulin glargine (LANTUS SOLOSTAR) 100 units/mL injection pen Inject 48 Units under the skin nightly. 15 mL 2    metoprolol tartrate (LOPRESSOR) 25 MG tablet Take 25 mg by mouth 2 times daily.      montelukast (SINGULAIR) 10 MG tablet Take 1 tablet (10 mg) by mouth every evening. 90 tablet 1    naloxone (NARCAN) 4 mg/0.1 mL nasal spray Spray 1 spray into one nostril once as needed.      NOVOLOG FLEXPEN 100 UNIT/ML injection pen Inject 20 Units under the skin 3 times daily as needed for High Blood Sugar. AM: 20 units + sliding scale Mid day: 17 + sliding scale and PM 12 + Sliding scale 15 mL 2    ondansetron (ZOFRAN ODT) 8 MG disintegrating tablet Take 1 tablet (8 mg) on or under the tongue every 8 hours as needed for Nausea/Vomiting. 30 tablet 0    oxycodone-acetaminophen (PERCOCET) 10-325 MG tablet Take 1 tablet by mouth every 8 hours as needed for Severe Pain (Pain Score 7-10). 90 tablet 0    pantoprazole (PROTONIX) 40 MG tablet Take 1 tablet (40 mg) by mouth every morning (before breakfast). 90 tablet 1    predniSONE (DELTASONE) 10 MG tablet Take 1 tablet (10 mg) by mouth daily. 90 tablet 1    pregabalin (LYRICA) 75 MG capsule Take 1 capsule (75 mg) by mouth 2 times daily. 1 tablet AM and 2 tablets PM 60 capsule 2    spironolactone (ALDACTONE) 25 MG tablet       TECHLITE PEN NEEDLES 32G X 4 MM MISC  USE ONE PEN NEEDLE WITH EACH INSULIN ADMINISTRATION. 200 each 11     No current  facility-administered medications for this visit.       Objective:     Vitals:    10/12/20 1320   BP: 150/62   BP Location: Right arm   BP Patient Position: Sitting   BP cuff size: Large   Pulse: 63   Resp: 20   Temp: 98.3 F (36.8 C)   TempSrc: Temporal   SpO2: 96%       Labs and Imaging:     Lab Results   Component Value Date    WBC 10.3 (H) 07/09/2020    RBC 5.36 (H) 07/09/2020    HGB 12.7 07/09/2020    HCT 44.0 07/09/2020    MCV 82.1 07/09/2020    MCHC 28.9 (L) 07/09/2020    RDW 16.5 (H) 07/09/2020    PLT 395 (H) 07/09/2020    MPV 10.2 07/09/2020       Lab Results   Component Value Date    BUN 11 07/09/2020    BUN 11 07/09/2020    CREAT 1.07 (H) 07/09/2020    CREAT 1.07 (H) 07/09/2020    CL 98 07/09/2020    CL 98 07/09/2020    NA 144 07/09/2020    NA 144 07/09/2020    K 3.7 07/09/2020    K 3.7 07/09/2020    [place] 10.4 07/09/2020    [place] 10.4 07/09/2020    TBILI 0.36 07/09/2020    TBILI 0.36 07/09/2020    ALB 4.6 07/09/2020    ALB 4.6 07/09/2020    TP 6.9 07/09/2020    TP 6.9 07/09/2020    AST 21 07/09/2020    AST 21 07/09/2020    ALK 112 (H) 07/09/2020    ALK 112 (H) 07/09/2020    BICARB 32 (H) 07/09/2020    BICARB 32 (H) 07/09/2020    ALT 22 07/09/2020    ALT 22 07/09/2020    GLU 183 (H) 07/09/2020    GLU 183 (H) 07/09/2020       No results found.      Followup:       Return in about 3 months (around 01/12/2021).    Future Appointments   Date Time Provider [place]   10/12/2020  1:00 PM [name], MD [facility]   12/25/2020  1:00 PM [name], MD [facility] [place]   01/02/2021 10:30 AM Eda Keys, MD [facility] [id number]               [name], MD  [facility]  Internal Medicine

## 2020-10-12 NOTE — Assessment & Plan Note (Signed)
T2DMw/peripheralneuropathies, occasional blurry vision-last A1c8.4%, UACR UTC (06/2020). On Lantus 48, Novolog 20-17-12TID plusISS,(stopped Jardiance 25 mg daily due to yeast infections, stopped glipizide, metformin cause GI issues). Last eye exam >10 years ago, pending repeat eye exam, Endo/CDE referral.Elevated in the setting of chronic steroids    -Discussed importance of BG management for prevention of micro/macrovascular complications.  -Continue current regimen  -Cont ARB/statin  -Referral to Endocrinology and CDE for further support, referral placed per [facility] and advised to schedule  -F/u with Pulm to discuss steroid taper  -will discuss adding GLP-1 per [facility], would benefit with weight loss  -Continue annual eye exams, referral placed l  -F/u as planned

## 2020-10-12 NOTE — Assessment & Plan Note (Signed)
Worse since recent admission  -pt was discharged with Xanax  -advised to stop given use of opioids  -e-con to Psych to assist with medication optimizations to best manage anxiety (avoiding BZDs or other meds that prolong QTC)  -follow-up as planned

## 2020-10-12 NOTE — Patient Instructions (Addendum)
Diabetes and Diet    There is no one diet for all people with diabetes.  There is, however, a "recipe" for eating healthfully that is similar to recommendations for heart health, cancer prevention and weight management.    To successfully manage diabetes, you need to understand how foods and nutrition affect your body.  Food portions and food choices are important.   Carbohydrates, fat and protein need to be balanced to ensure blood sugar levels stay as stable as possible (This is particularly important for people with Type 1 diabetes.).    The keys to a healthy eating plan are:    · Eat meals and snacks regularly (at planned times).  · Eat about the same amount of food at each meal or snack.  · Choose healthful foods to support a healthy weight and heart.    Put Together a Plan    You need a registered dietitian nutritionist on your team who will work with you to put together an individualized eating plan that takes into account your food preferences, level of physical activity and lifestyle.    Your RDN will work with you and your physician to strike the right balance between your eating plan and any diabetes medications you take.    Plan Healthy Meals    Good health depends on eating a variety of foods that contain the right amounts  of carbohydrates, protein and healthy fats, as well as vitamins, minerals, fiber and water.   If you have diabetes, a healthy daily eating plan includes:    · Starchy foods including breads, cereals, pasta, rice, other whole grains and starchy vegetables such as beans, corn and peas  · Non-starchy vegetables including carrots, green beans and broccoli  · Fruits  · Lean meat, fish, poultry, low-fat cheese and tofu  · Fat-free or low-fat milk and yogurt  · Healthy fats such as plant-based oils and trans-fat-free spreads    The actual amounts of each food group depend on the number of calories you need, which, in turn, depends on your age, gender, size and activity level.  Together with  your RDN, you can develop an eating plan that is best for you.    Meal Plan Options: Food Lists and Carbohydrate Counting    Carbohydrates affect your blood sugar more than protein or fat.  As your eating plan is designed, portioning out foods high in carbohydrates will help control blood sugar levels.    Choose Your Foods: Food Lists for Diabetes    The food lists for diabetes planning uses food groups, like the ones listed abote: starchy foods, vegetables, fruits, meats, dairy and fat.   Within each food list are food choices that contain similar amounts of fat, protein and carbohydrates.   Because foods are divided this way, you are able to substitute one food choice for another within any one group.  For example, bread, cereal, rice and potatoes are all starch choices.  Your eating plan will specify a certain number of starch choices that you can have for a meal or snack.  You may then select any foods within the starch group that stay within the number of choices planned.  for each meal you will likely have food choices from at least three to four food lists.    Carbohydrate Counting    You may need to keep track of the amount of carbohydrates you eat and drink.  Your RDN will determine a specific amount of carbohydrates for each meal   or snack to ensure your blood sugar stays in good control.  Your job is to learn the number of carbohydrates in each food and drink measured in grams or carb choices, then keep to the planned number at each meal and snack.    Carbohydrate counting gives you wiggle room in terms of making food choices since one carb choice equals 15 grams carbohydrate.  However, to ensure you eat healthfully your focus should be on whole grains, fruits, vegetables, beans and low-fat milk.  Sweets should be saved as occasional treats.    If you have been diagnosed with diabetes, seek the expert advice of a registered dietitian nutritionist  to help you manage the disease while ensuring you get the  nutrients your body needs.            Relaxation Techniques    Instructions:  Practice two techniques twice a day every day, and more as needed until you have mastered them.    Progressive Relaxation   This technique is often most useful when you tape the instructions beforehand and listen to the tape. You can tape these instructions, reading them slowly and leaving a short pause after each one.  Lie on your back, close your eyes.   Feel your feet. Sense their weight. Become aware of the muscles in them, how they feel, whether theyre tensed or relaxed.  If you cant tell whether something is tensed or relaxed, actively tense them, and thats what you dont want.  Now will those muscles to relax.  Once you feel youve relaxed your feet, move up to your calves.  Feel your calves.  Become aware of the muscles in them, how they feel.  Now will them to relax.    Feel your knees. Sense their weight. Consciously relax them and feel them sink into the bed.   Feel you upper legs and thighs. Feel their weight. Consciously relax them and feel them sink into the bed.   Feel your abdomen and chest. Sense your breathing. Consciously will them to relax. Deepen your breathing slightly and feel your abdomen and chest sink into the bed.   Feel your buttocks. Sense their weight. Consciously relax them and feel them sink into the bed.   Feel your hands. Sense their weight. Consciously relax them and feel them sink into the bed.   Feel your upper arms. Sense their weight. Consciously relax them and feel them sink into the bed.   Feel your shoulders. Sense their weight. Consciously relax them and feel them sink into the bed.   Feel your neck. Sense its weight. Consciously relax it and feel it sink into the bed.   Feel your head and skull. Sense its weight. Consciously relax it and feel it sink into the bed.   Feel your mouth and jaw. Consciously relax them. Pay particular attention to your jaw muscles and unclench them if you need to.  Feel your mouth and jaw relax and sink into the bed.   Feel your eyes. Sense if there is tension in your eyes. Sense if you are forcibly closing your eyelids. Consciously relax your eyelids and feel the tension slide off the eyes.   Feel your face and cheeks. Consciously relax them and feel the tension slide off into the bed.   Mentally scan your body. If you find any place that is still tense, then consciously relax that place and let it sink into the bed.     Toe Tensing  This one may seem like a bit of a contradiction to the previous one, but by alternately tensing and relaxing your toes, you actually draw tension from the rest of the body. Try it!  Lie on your back, close your eyes.   Sense your toes.   Now pull all 10 toes back toward your face. Count to 10 slowly.   Now relax your toes.   Count to 10 slowly.   Now repeat the above cycle 10 times.     Deep Breathing   By concentrating on our breathing, deep breathing allows the rest of our body to relax itself. Deep breathing is a great way to relax the body and get everything into synchrony. Relaxation breathing is an important part of yoga and martial arts for this reason.  Lie on your back.   Slowly relax your body. You can use the progressive relaxation technique we described above.   Begin to inhale slowly through your nose if possible. Fill the lower part of your chest first, then the middle and top part of your chest and lungs. Be sure to do this slowly, over 8-10 seconds.   Hold your breath for a second or two.   Then quietly and easily relax and let the air out.   Wait a few seconds and repeat this cycle.   If you find yourself getting dizzy, then you are overdoing it. Slow down.   You can also imagine yourself in a peaceful situation such as on a warm, gentle ocean. Imagine that you rise on the gentle swells of the water as you inhale and sink down into the waves as you exhale.   You can continue this breathing technique for as long as you like until  you fall asleep.     Relaxed Breathing  This technique involves timing your breathing, with a certain number of seconds to inhale, a certain number of seconds to exhale.  You should start with a count of about 3 seconds to inhale to start, 6 seconds to exhale.    1.  Take a normal calm breath and count the seconds it takes to inhale.  Now try doubling this time as you exhale (3 seconds to inhale, 6 to exhale).    2.  Continue this breathing for 15 minutes.  3.  Relax as you practice this.    4.  As you feel more comfortable, the next time you do this you can slow things down a little more (4 secs inhale, 8 secs exhale).  Always keep the ratio 1:2.  Do not go over 6 seconds on an inhale.  5.  Do not hold your breath in between.  If you feel light headed, take a break or use a faster rate.    Guided Imagery   In this technique, the goal is to visualize yourself in a peaceful setting.  Lie on your back with your eyes closed.   Imagine yourself in a favorite, peaceful place. The place may be on a sunny beach with the ocean breezes caressing you, swinging in a hammock in the mountains or in your own backyard. Any place that you find peaceful and relaxing is OK.   Imagine you are there. See and feel your surroundings, hear the peaceful sounds, smell the flowers or the barbecue, fell the warmth of the sun and any other sensations that you find. Relax and enjoy it.   You can return to this place any night you need to. As you  use this place more and more you will find it easier to fall asleep as this imagery becomes a sleep conditioner.   Some patients find it useful to visualize something boring. This may be a particularly Engineer, mining, co-worker or friend. .    Mental Mantra - Numbers     1.  Begin by closing your eyes.     2.  Now repeat the number One to yourself in your head.  Repeat it slowly and rhythmically to yourself-  oneoneone.     3.  Continue this for as long as you can.  At some point you will  find your mind start to wander.  You may start daydreaming or running through things you have to do.  You may go on daydreaming for minutes at a time.  Once you catch yourself, though, bring yourself back to your number.  Now move back to the number.     4.  Begin repeating that number again.  This time repeat the number two.   [id number].   5.  When your mind wanders again, bring yourself back to center and start again with the next number.    Vipassana Meditation (The Thought Bubble)    Begin by sitting comfortably and closing your eyes.    Focus on feeling your breathing.  Feel the air moving in and out through your nose.  Now imagine a thought in your head, that can be about anything.  Imagine that thought leaving your head like a bubble, floating away.  Let it float away and just watch it leave, not attaching to it, not thinking about it, just watching it go.  As the next thought arises, let that thought float away too, away into the world.  With each new thought, let it float away.  You may find that youll get caught up in a thought, thinking about it or daydreaming.  Once you realize it, let go of it, and see it leave you just like every other thought.    With each of these practices, the techniques are not complex.  The key is trying them and sticking through them.      It is when [place] practiced them, felt restless like you want to stop, but go back and keep practicing for another 20 minutes that youll start to feel the real effects.  As we relax, all the tension and anxiety weve kept at [place] rises up.  This makes [place] feel restless but should be the time to practice more, because just beyond that anxiety is relaxation.    With all relaxation techniques listed here, try to sit comfortably and in a position where you move as little as possible.

## 2020-10-15 ENCOUNTER — Encounter (INDEPENDENT_AMBULATORY_CARE_PROVIDER_SITE_OTHER): Payer: Self-pay | Admitting: Student in an Organized Health Care Education/Training Program

## 2020-10-15 ENCOUNTER — Other Ambulatory Visit (INDEPENDENT_AMBULATORY_CARE_PROVIDER_SITE_OTHER): Payer: Self-pay | Admitting: Psychiatry

## 2020-10-15 ENCOUNTER — Telehealth (INDEPENDENT_AMBULATORY_CARE_PROVIDER_SITE_OTHER): Payer: Self-pay

## 2020-10-15 NOTE — Telephone Encounter (Signed)
The Agent called the patient to schedule and LVM w/ scheduling information.     [place]

## 2020-10-15 NOTE — Progress Notes (Signed)
======================================   eConsult Response Below =========================================    Upon review of the Clinical Question above, I believe the patient is better served by an in-person consultation rather than an electronic one. Please notify your office's authorization staff to convert this eConsult to a standard consult, and ask the patient to call Psychiatry's new patient line at [phone] to schedule an appointment.     Bill Salinas, MD  Signature Derived From Controlled Access Password, October 15, 2020, 4:51 PM

## 2020-10-19 NOTE — Telephone Encounter (Signed)
Terrilee Files, physical therapist with [facility] called wanting to relay info below:     PT 1 time a week for 8 weeks   1- Assessment by behavorial nurse

## 2020-10-24 ENCOUNTER — Telehealth (INDEPENDENT_AMBULATORY_CARE_PROVIDER_SITE_OTHER): Payer: Self-pay | Admitting: Student in an Organized Health Care Education/Training Program

## 2020-10-24 NOTE — Telephone Encounter (Signed)
Who is calling: Ancillary: Shourith from [facility] is calling on behalf of patient  Insurance Coverage Verified: Active- in network     Reason for this call:   Resume care last week and will faxing over orders for the doctor to sign     Action required by office: No action needed. Routing to clinic as FYI    Duplicate encounter? No previous documentation found on this issue.     Best way to [phone]      Inquiry has been read verbatim to this caller. Verbalizes satisfaction and confirms the above is accurate: yes      Has been advised this message will be transmitted to office and can expect a response within the next 24-72 hours.

## 2020-10-25 ENCOUNTER — Encounter (INDEPENDENT_AMBULATORY_CARE_PROVIDER_SITE_OTHER): Payer: Self-pay | Admitting: Student in an Organized Health Care Education/Training Program

## 2020-10-25 DIAGNOSIS — E1169 Type 2 diabetes mellitus with other specified complication: Secondary | ICD-10-CM

## 2020-10-25 DIAGNOSIS — Z794 Long term (current) use of insulin: Secondary | ICD-10-CM

## 2020-10-25 NOTE — Telephone Encounter (Signed)
Mychart message sent.

## 2020-10-25 NOTE — Telephone Encounter (Signed)
Mychart message sent.

## 2020-10-26 NOTE — Telephone Encounter (Signed)
From: Jacob Filla  To: [name], MD  Sent: 10/25/2020 5:41 PM PDT  Subject: Dexcom G6    I just read that this device is covered by Medicare and I was wondering could you write a prescription for this so I can get it for me the Dexcom G 6 let me know if we can get this

## 2020-10-29 ENCOUNTER — Other Ambulatory Visit: Payer: Self-pay

## 2020-10-29 MED ORDER — DEXCOM G6 RECEIVER DEVI
0 refills | Status: DC
Start: 2020-10-29 — End: 2020-11-05
  Filled 2020-10-29: qty 1, 30d supply, fill #0

## 2020-10-29 MED ORDER — DEXCOM G6 SENSOR MISC
0 refills | Status: DC
Start: 2020-10-29 — End: 2020-11-05
  Filled 2020-10-29: qty 3, 30d supply, fill #0

## 2020-10-29 MED ORDER — DEXCOM G6 TRANSMITTER MISC
0 refills | Status: DC
Start: 2020-10-29 — End: 2020-11-05
  Filled 2020-10-29: qty 1, 90d supply, fill #0

## 2020-10-30 ENCOUNTER — Ambulatory Visit: Payer: Medicare Other | Attending: Anesthesiology | Admitting: Anesthesiology

## 2020-10-30 ENCOUNTER — Ambulatory Visit (INDEPENDENT_AMBULATORY_CARE_PROVIDER_SITE_OTHER): Payer: Medicare Other

## 2020-10-30 VITALS — Temp 97.6°F

## 2020-10-30 DIAGNOSIS — Z794 Long term (current) use of insulin: Secondary | ICD-10-CM | POA: Insufficient documentation

## 2020-10-30 DIAGNOSIS — E782 Mixed hyperlipidemia: Secondary | ICD-10-CM

## 2020-10-30 DIAGNOSIS — E119 Type 2 diabetes mellitus without complications: Secondary | ICD-10-CM | POA: Insufficient documentation

## 2020-10-30 DIAGNOSIS — M797 Fibromyalgia: Secondary | ICD-10-CM | POA: Insufficient documentation

## 2020-10-30 DIAGNOSIS — E559 Vitamin D deficiency, unspecified: Secondary | ICD-10-CM | POA: Insufficient documentation

## 2020-10-30 DIAGNOSIS — Z0000 Encounter for general adult medical examination without abnormal findings: Secondary | ICD-10-CM | POA: Insufficient documentation

## 2020-10-30 LAB — LIPID(CHOL FRACT) PANEL, BLOOD
Cholesterol: 217 mg/dL (ref <200)
HDL-Cholesterol: 72 mg/dL
LDL-Chol (Calc): 119 mg/dL (ref <160)
Non-HDL Cholesterol: 145 mg/dL
Triglycerides: 129 mg/dL (ref 10–170)

## 2020-10-30 LAB — COMPREHENSIVE METABOLIC PANEL, BLOOD
ALT (SGPT): 24 U/L (ref 0–33)
AST (SGOT): 22 U/L (ref 0–32)
Albumin: 4.6 g/dL (ref 3.5–5.2)
Alkaline Phos: 110 U/L (ref 40–130)
Anion Gap: 7 mmol/L (ref 7–15)
BUN: 21 mg/dL (ref 8–23)
Bicarbonate: 32 mmol/L — ABNORMAL HIGH (ref 22–29)
Bilirubin, Tot: 0.77 mg/dL (ref <1.2)
Calcium: 10.1 mg/dL (ref 8.5–10.6)
Chloride: 102 mmol/L (ref 98–107)
Creatinine: 0.9 mg/dL (ref 0.51–0.95)
GFR: >60 mL/min
Glucose: 71 mg/dL (ref 70–99)
Potassium: 4.5 mmol/L (ref 3.5–5.1)
Sodium: 141 mmol/L (ref 136–145)
Total Protein: 7.1 g/dL (ref 6.0–8.0)
eGFR Based on CKD-EPI 2021 Equation: >60 mL/min

## 2020-10-30 LAB — CBC WITH DIFF, BLOOD
ANC-Automated: 7.4 10*3/uL — ABNORMAL HIGH (ref 1.6–7.0)
Abs Basophils: 0 10*3/uL (ref <0.2)
Abs Eosinophils: 0 10*3/uL (ref 0.0–0.5)
Abs Lymphs: 1.2 10*3/uL (ref 0.8–3.1)
Abs Monos: 0.4 10*3/uL (ref 0.2–0.8)
Basophils: 0 %
Eosinophils: 0 %
Hct: 39.4 % (ref 34.0–45.0)
Hgb: 11.4 gm/dL (ref 11.2–15.7)
Imm Gran %: 1 % — ABNORMAL HIGH (ref <1)
Imm Gran Abs: 0.1 10*3/uL — ABNORMAL HIGH (ref <0.1)
Lymphocytes: 13 %
MCH: 24.7 pg — ABNORMAL LOW (ref 26.0–32.0)
MCHC: 28.9 g/dL — ABNORMAL LOW (ref 32.0–36.0)
MCV: 85.5 um3 (ref 79.0–95.0)
MPV: 11.3 fL (ref 9.4–12.4)
Monocytes: 5 %
Plt Count: 213 10*3/uL (ref 140–370)
RBC: 4.61 10*6/uL (ref 3.90–5.20)
RDW: 18.5 % — ABNORMAL HIGH (ref 12.0–14.0)
Segs: 81 %
WBC: 9.2 10*3/uL (ref 4.0–10.0)

## 2020-10-30 LAB — VITAMIN D, 25-OH TOTAL: Vitamin D, 25-Hydroxy: 76 ng/mL (ref 30–80)

## 2020-10-30 LAB — GLYCOSYLATED HGB(A1C), BLOOD: Glyco Hgb (A1C): 7.3 % — ABNORMAL HIGH (ref 4.8–5.8)

## 2020-10-30 NOTE — Patient Instructions (Signed)
The following options were discussed today:    1) Trigger point injections neck muscles  2) Referral to integrative medicine  3) Recommend your primary care doctor start duloxetine  4) Please get imaging for [place] to review at next visit  5) Recommend your primary care refer to rheumatology      What is a Trigger Point?  These are tight, dysfunctional, and painful bands of muscle that are often located in the neck, upper back, and lower back.     These trigger points are usually very tender to applied pressure, and can often radiates pain outwards when pressed. Over time, this muscular tension can reduce blood flow to the painful regions and worsen the pain cycle.     This condition is often referred to as myofascial pain syndrome by healthcare providers.     What is a Doctor, hospital?  A trigger point injection involves the use of a small needle to administer local anesthetic medication directly into painful areas within the muscles. This helps to relax the muscles and break the cycle of dysfunction. Your physician may also use a technique known as dry needling to help break up the tight tissue and stimulate increased blood flow to the area.      In certain cases, your physician may choose to inject Botox (botulinum toxin) into the trigger points. This medication reduces muscle contraction at a molecular level, and can also be very effective in treating myofascial pain.     How Are Trigger Point Injections Performed?  You will be asked to sit or lie in a position in which the affected are be accessible to your physician.     Cleaning solution is applied to site of the pain. Your physician will then use a small needle to inject some local anesthetic medication into each of the painful areas of the affected muscles.     For trigger point injections of certain structures, such as the piriformis muscle, image guidance with either ultrasound or fluoroscopy (X-ray) will be utilized to ensure accurate placement of  the needle.     Risks and Complications:  Trigger point injections are considered very safe in general.     However, as with any minor medical procedure, there are potential risks. This includes soreness, bleeding, bruising, or infection. If the procedure is done in the upper back or chest, then pneumothorax (collapsed lung) is also a potential rare complication.     We will take every measure to minimize these potential risks and maximize the therapeutic benefit.

## 2020-10-30 NOTE — Progress Notes (Signed)
PAIN MEDICINE NEW CONSULT NOTE  Referring Physician [name]  Primary Care Physician [name]    Chief Complaint: Low Back Pain (Low back pain. Chronic. ), Knee Pain (Bilateral knee pain. Chronic. ), and Rib Pain (Bilateral rib pain, Right is worse. Chronic. )      History of Present Illness  This is a 65 year old female who  has a past medical history of Diabetes mellitus (CMS-HCC), Hypertension, and Sarcoidosis of lung (CMS-HCC). and  has a past surgical history that includes Cholecystectomy; Hysterectomy; and Knee arthroscopy (Left). referred for Low Back Pain (Low back pain. Chronic. ), Knee Pain (Bilateral knee pain. Chronic. ), and Rib Pain (Bilateral rib pain, Right is worse. Chronic. )    This patient is presenting as a referral from her primary care provider for recommendations regarding her pharmacotherapy for chronic, widespread pain.  The patient is disabled secondary to her multiple comorbidities, including chronic pain.  She states that currently her pain is located in her upper back, left shoulder, lower back bilaterally, lower buttocks bilaterally, into both knees.  She also endorses pains in her wrists and her hands.  She previously had a pain provider in [place], as below.  She has seen a provider within 1 year's time.  She recently moved here to be closer to her family.  In addition to the above, she has a diagnosis of rheumatoid arthritis and has not seen a rheumatologist in years.     They describe their pain as sharp, radiating down the arms and legs.   Their pain is associated with physical activity.   This pain has made it hard for them to perform ADL.  They deny saddle anesthesia or bowel or bladder incontinence.    The patient stated their pain today is Pain Score: 8/10.  Over the past week the patient's pain has been at its worst 5/10, at best 9/10, and averages 8/10.   During the past week, it has interfered with enjoyment of life 10/10 and general activity  10/10.    Therapeutic History  Past pain providers - Dr. Azalee Course - [place], [place].  Prior interventional pain procedures and response include:  Steroid injection in the lower back (does not know details) - did not help     Coristol shots - lower back, did not help  Non-interventional pain treatments and response include:  Acupuncture - helpful    The patient has seen a physical therapist to treat the current problem.   PT made pain worse  [place] - 8 months, didn't help    Current Pain Medications:  none  oxycodone - currently taking and helpful  pregabalin - currently taking and helpful     Patient has tried, but is not currently taking, the following pain medications:   none  acetaminophen - not helpful     Current antiplatelet or anticoagulant medications: none    Past Medical History:   Diagnosis Date    Diabetes mellitus (CMS-HCC)     Hypertension     Sarcoidosis of lung (CMS-HCC)     bilat     Past Surgical History:   Procedure Laterality Date    CHOLECYSTECTOMY      HYSTERECTOMY      KNEE ARTHROSCOPY Left      Current Outpatient Medications   Medication Sig Dispense Refill    albuterol (PROVENTIL) (2.5 MG/3ML) 0.083% nebulization 3 mL (2.5 mg) by Nebulization route every 4 hours as needed for Wheezing. 1  each 2    Albuterol Sulfate 108 (90 Base) MCG/ACT AEPB Inhale 1 puff by mouth as needed.      ALPRAZolam (XANAX) 0.5 MG tablet       aspirin 81 MG EC tablet Take 1 tablet (81 mg) by mouth daily. 90 tablet 1    atorvastatin (LIPITOR) 40 MG tablet Take 1 tablet (40 mg) by mouth daily. 90 tablet 3    Continuous Blood Gluc Receiver (CONTINUOUS BLOOD GLUCOSE, DEXCOM G6, RECEIVER) Use daily to read glucose topically 1 each 0    Continuous Blood Gluc Sensor (CONTINUOUS BLOOD GLUCOSE, DEXCOM G6, SENSOR) Insert subcutaneously every 10 days. 3 each 0    Continuous Blood Gluc Transmit (CONTINUOUS BLOOD GLUCOSE, DEXCOM G6, TRANSMITTER) Use daily to transmit subcutaneous glucose value. Change every 3  months. 2 each 0    famotidine (PEPCID) 40 MG tablet Take 1 tablet (40 mg) by mouth daily. 90 tablet 1    fexofenadine (ALLEGRA) 180 MG tablet Take 1 tablet (180 mg) by mouth daily. 90 tablet 1    fluticasone-umeclidinium-vilanterol (TRELEGY ELLIPTA) 100-62.5-25 MCG/INH AEPB Inhale 1 puff by mouth daily.      folic acid (FOLVITE) 1 MG tablet Take 1 tablet (1 mg) by mouth daily. 90 tablet 1    Hydrocod Polst-CPM Polst ER 10-8 MG/5ML SUER Take 5 mL by mouth every 12 hours as needed (cough). 240 mL 0    HYDROCODONE-CHLORPHENIRAMINE PO 5 mL. Every 12 hours as needed for cough      hydrOXYzine HCL (ATARAX) 50 MG tablet TAKE 1 TABLET BY MOUTH 2 TIMES DAILY AS NEEDED FOR ITCHING 180 tablet 1    insulin glargine (LANTUS SOLOSTAR) 100 units/mL injection pen Inject 48 Units under the skin nightly. 15 mL 2    metoprolol tartrate (LOPRESSOR) 25 MG tablet Take 25 mg by mouth 2 times daily.      montelukast (SINGULAIR) 10 MG tablet Take 1 tablet (10 mg) by mouth every evening. 90 tablet 1    naloxone (NARCAN) 4 mg/0.1 mL nasal spray Spray 1 spray into one nostril once as needed.      NOVOLOG FLEXPEN 100 UNIT/ML injection pen Inject 20 Units under the skin 3 times daily as needed for High Blood Sugar. AM: 20 units + sliding scale Mid day: 17 + sliding scale and PM 12 + Sliding scale 15 mL 2    ondansetron (ZOFRAN ODT) 8 MG disintegrating tablet Take 1 tablet (8 mg) on or under the tongue every 8 hours as needed for Nausea/Vomiting. 30 tablet 0    oxycodone-acetaminophen (PERCOCET) 10-325 MG tablet Take 1 tablet by mouth every 8 hours as needed for Severe Pain (Pain Score 7-10). 90 tablet 0    pantoprazole (PROTONIX) 40 MG tablet Take 1 tablet (40 mg) by mouth every morning (before breakfast). 90 tablet 1    predniSONE (DELTASONE) 10 MG tablet Take 1 tablet (10 mg) by mouth daily. 90 tablet 1    pregabalin (LYRICA) 75 MG capsule Take 1 capsule (75 mg) by mouth 2 times daily. 1 tablet AM and 2 tablets PM 60 capsule  2    spironolactone (ALDACTONE) 25 MG tablet       TECHLITE PEN NEEDLES 32G X 4 MM MISC USE ONE PEN NEEDLE WITH EACH INSULIN ADMINISTRATION. 200 each 11     No current facility-administered medications for this visit.     Allergies   Allergen Reactions    Sulfa Drugs Anaphylaxis    Morphine Itching  Social History     Socioeconomic History    Marital status: Married     Spouse name: Not on file    Number of children: Not on file    Years of education: Not on file    Highest education level: Not on file   Occupational History    Not on file   Tobacco Use    Smoking status: Former Smoker    Smokeless tobacco: Never Used   Substance and Sexual Activity    Alcohol use: Not Currently    Drug use: Never    Sexual activity: Not on file   Other Topics Concern    Not on file   Social History Narrative    Not on file     Social Determinants of Health     Financial Resource Strain: Not on file   Food Insecurity: Not on file   Transportation Needs: Not on file   Physical Activity: Not on file   Stress: Not on file   Social Connections: Not on file   Intimate Partner Violence: Not on file   Housing Stability: Not on file       Opioid Risk Tool Score:             Family History   Problem Relation Name Age of Onset    Diabetes Mother      Heart Disease Mother      Kidney Disease Mother      Hypertension Father      Alcohol/Drug Father         Vitals: Temp 97.6 F (36.4 C) (Temporal)     Physical Exam  Constitutional: Well developed, NAD.  Psych: Alert, appropriate.  Head: Normocephalic.   Eyes: Conjunctiva clear.  Skin: Normal color.    Musculoskeletal:  C-Spine  Cervical taut, tender bands consistent with active trigger points: cervical paraspinals, trapezius, none    T-Spine  Thoracic taut, tender bands consistent with active trigger points: thoracic paraspinals, none    L-Spine    Lumbar taut, tender bands consistent with active trigger points: none     Sacroiliac Joint   Fortin Finger Test: Right  positive; Left positive  .    Buttock   Buttock taut, tender bands consistent with active trigger points: none    Neurological:  Cranial Nerves: II-XII grossly intact except IX, X, XII not tested as wearing face mask.     Motor:                          Shoulder Abduction: 5/5 bilaterally  Biceps: 5/5 bilaterally  Triceps: 5/5 bilaterally  Wrist Extension: 5/5 bilaterally  Wrist Flexion: 5/5 bilaterally  Interosseous: 5/5 bilaterally  Iliopsoas: 5/5 bilaterally  Quadriceps: 5/5 bilaterally  Hamstrings: 5/5 bilaterally  Ankle Dorsiflexion: 5/5 bilaterally  Ankle Plantarflexion: 5/5 bilaterally    Reflexes:                    Clonus: Absent bilaterally.    Sensory: Intact to light touch x 4 extremities distally    Labs and Imaging  CT Abdomen And Pelvis W/O Contrast    Result Date: 05/13/2020  IMPRESSION: CT scan of the abdomen and pelvis without IV contrast. No acute intra-abdominal finding Note that assessment of solid organs is limited in the absence of intravenous contrast.        Assessment and Plan  Encounter Diagnoses   Name Primary?    Fibromyalgia Yes  This is a 65 year old female who  has a past medical history of Diabetes mellitus (CMS-HCC), Hypertension, and Sarcoidosis of lung (CMS-HCC). and  has a past surgical history that includes Cholecystectomy; Hysterectomy; and Knee arthroscopy (Left). who presents for Low Back Pain (Low back pain. Chronic. ), Knee Pain (Bilateral knee pain. Chronic. ), and Rib Pain (Bilateral rib pain, Right is worse. Chronic. )    This patient is presenting as a referral from her primary care provider for recommendations regarding her pharmacotherapy for chronic, widespread pain.  She has diffuse pain by history and on physical exam, particularly in the soft tissue, which is concerning for fibromyalgia.  We do have some concern based on her history that she could have underlying spondylosis as well.  She does not exhibit signs or symptoms of myelopathy at the visit today.   We have asked her to obtain recent MRI neuraxial imaging results from [facility] and if she is unable to, to contact our office, and we will arrange to repeat imaging here.  We discussed performing trigger point injections in her neck now, and she would like to proceed at this time.  She had a positive Fortin fingers test bilaterally and we discussed the potential for future sacroiliac joint injections; but, will defer as we wait for the results of the imaging incomplete the trigger point injections, as above.  She states that acupuncture has helped in the past and so he referred her to our [place], as below.  In terms of pharmacotherapy, her primary care provider could consider starting her on duloxetine 60 mg daily as a potential treatment for fibromyalgia and/or osteoarthritis.  We think that it is reasonable to continue the pregabalin and Percocet, at their currently ordered doses.  Finally, we have some concern that she has a diagnosis of rheumatoid arthritis, diffuse pains including numerous arthropathies, and has not seen a rheumatologist in years.  We recommend that her primary care provider referred to a rheumatologist for further evaluation and management, including initiation of DMARDs, in evaluation for possible fibromyalgia.    As the patient has had an adequate trial of PT, at this time at this time it is appropriate to proceed with interventional pain treatments.    Kashauna was seen today for low back pain, knee pain and rib pain.    Diagnoses and all orders for this visit:    [facility]  -     Case Request: Trigger point injection neck and upper back with ultrasound; Standing    Other orders  -     Vital signs; Standing  -     Nursing Misc Order: Verify Consent for Procedure; Standing  -     Glucose (POC); Standing  -     Nursing Misc Order: Anticoagulation Status; Standing  -     Prothrombin Time, Blood Blue; Standing  -     INR  & Protime (POCT); Standing  -     Oxygen Non-Protocol; Standing  -     Discharge Patient - Review & Print AVS; Standing      Patient Instructions   The following options were discussed today:    1) Trigger point injections neck muscles  2) Referral to integrative medicine  3) Recommend your primary care doctor start duloxetine  4) Please get imaging for [place] to review at next visit  5) Recommend your primary care refer to rheumatology  What is a Trigger Point?  These are tight, dysfunctional, and painful bands of muscle that are often located in the neck, upper back, and lower back.     These trigger points are usually very tender to applied pressure, and can often radiates pain outwards when pressed. Over time, this muscular tension can reduce blood flow to the painful regions and worsen the pain cycle.     This condition is often referred to as myofascial pain syndrome by healthcare providers.     What is a Doctor, hospital?  A trigger point injection involves the use of a small needle to administer local anesthetic medication directly into painful areas within the muscles. This helps to relax the muscles and break the cycle of dysfunction. Your physician may also use a technique known as dry needling to help break up the tight tissue and stimulate increased blood flow to the area.      In certain cases, your physician may choose to inject Botox (botulinum toxin) into the trigger points. This medication reduces muscle contraction at a molecular level, and can also be very effective in treating myofascial pain.     How Are Trigger Point Injections Performed?  You will be asked to sit or lie in a position in which the affected are be accessible to your physician.     Cleaning solution is applied to site of the pain. Your physician will then use a small needle to inject some local anesthetic medication into each of the painful areas of the affected muscles.     For trigger point injections of certain  structures, such as the piriformis muscle, image guidance with either ultrasound or fluoroscopy (X-ray) will be utilized to ensure accurate placement of the needle.     Risks and Complications:  Trigger point injections are considered very safe in general.     However, as with any minor medical procedure, there are potential risks. This includes soreness, bleeding, bruising, or infection. If the procedure is done in the upper back or chest, then pneumothorax (collapsed lung) is also a potential rare complication.     We will take every measure to minimize these potential risks and maximize the therapeutic benefit.       Regarding the above interventions, the patient has been educated regarding the risks (including bleeding, infection, increased pain, nerve damage, spinal cord injury, or allergic reaction), benefits, and alternatives. The patient states he/she understands and is eager to proceed.    Follow-up: after above, 1 month    Medical Decision Making  Today reviewed notes from prior visit(s) with [facility]    Risks applicable to today's encounter and procedural risks relevant to the patient include:   decision regarding procedure(s) with identified patient or procedure risk factors with identified patient or procedure risk factors    Maxwell Caul, MD  Pain Medicine Fellow

## 2020-10-30 NOTE — Progress Notes (Signed)
Attending Note:     Subjective:   I reviewed the history.   Patient interviewed and examined.   This is a 65 year old female who has a past medical history of Diabetes mellitus (CMS-HCC), Hypertension, and Sarcoidosis of lung (CMS-HCC). she is here today for Low Back Pain (Low back pain. Chronic. ), Knee Pain (Bilateral knee pain. Chronic. ), and Rib Pain (Bilateral rib pain, Right is worse. Chronic. )    This is a new consultation.    Objective:   I have examined the patient and I concur with the fellow physician/resident physician/medical student exam as documented.   CT Abdomen And Pelvis W/O Contrast    Result Date: 05/13/2020  IMPRESSION: CT scan of the abdomen and pelvis without IV contrast. No acute intra-abdominal finding Note that assessment of solid organs is limited in the absence of intravenous contrast.      Assessment and plan reviewed with the fellow physician/resident physician/medical student.   I agree with the fellow physician/resident physician/medical student as documented.     See options discussed with patient in the patient instructions.      Encounter Diagnoses   Name Primary?    Fibromyalgia Yes       Kimberly Curtis was seen today for low back pain, knee pain and rib pain.    Diagnoses and all orders for this visit:    [facility]  -     Case Request: Trigger point injection neck and upper back with ultrasound; Standing    Other orders  -     Vital signs; Standing  -     Nursing Misc Order: Verify Consent for Procedure; Standing  -     Glucose (POC); Standing  -     Nursing Misc Order: Anticoagulation Status; Standing  -     Prothrombin Time, Blood Blue; Standing  -     INR & Protime (POCT); Standing  -     Oxygen Non-Protocol; Standing  -     Discharge Patient - Review & Print AVS; Standing        Patient Instructions   The following options were discussed today:    1) Trigger point injections neck muscles  2) Referral to integrative medicine  3)  Recommend your primary care doctor start duloxetine  4) Please get imaging for [place] to review at next visit  5) Recommend your primary care refer to rheumatology      What is a Trigger Point?  These are tight, dysfunctional, and painful bands of muscle that are often located in the neck, upper back, and lower back.     These trigger points are usually very tender to applied pressure, and can often radiates pain outwards when pressed. Over time, this muscular tension can reduce blood flow to the painful regions and worsen the pain cycle.     This condition is often referred to as myofascial pain syndrome by healthcare providers.     What is a Doctor, hospital?  A trigger point injection involves the use of a small needle to administer local anesthetic medication directly into painful areas within the muscles. This helps to relax the muscles and break the cycle of dysfunction. Your physician may also use a technique known as dry needling to help break up the tight tissue and stimulate increased blood flow to the area.      In certain cases, your physician may choose to inject  Botox (botulinum toxin) into the trigger points. This medication reduces muscle contraction at a molecular level, and can also be very effective in treating myofascial pain.     How Are Trigger Point Injections Performed?  You will be asked to sit or lie in a position in which the affected are be accessible to your physician.     Cleaning solution is applied to site of the pain. Your physician will then use a small needle to inject some local anesthetic medication into each of the painful areas of the affected muscles.     For trigger point injections of certain structures, such as the piriformis muscle, image guidance with either ultrasound or fluoroscopy (X-ray) will be utilized to ensure accurate placement of the needle.     Risks and Complications:  Trigger point injections are considered very safe in general.     However, as with any  minor medical procedure, there are potential risks. This includes soreness, bleeding, bruising, or infection. If the procedure is done in the upper back or chest, then pneumothorax (collapsed lung) is also a potential rare complication.     We will take every measure to minimize these potential risks and maximize the therapeutic benefit.       I have examined the patient, discussed the findings, reviewed the plan, and answered all questions with the patient.  See the fellow physician/resident physician/medical student note for further details.    Medical Decision Making  Today reviewed CURES and reviewed note(s) from primary care    Risks applicable to today's encounter and procedural risks relevant to the patient include:   decision regarding procedure(s) with identified patient or procedure risk factors with identified patient or procedure risk factors  risk of worsening of pain in setting of chronic pain disorder with possible central sensitization

## 2020-10-30 NOTE — Interdisciplinary (Signed)
Blood drawn from right hand with 23 gauge needle. 3 tubes taken.   Patient identity authenticated by Kayleana Waites Lynn Refugia Laneve, LVN.

## 2020-10-31 ENCOUNTER — Telehealth (INDEPENDENT_AMBULATORY_CARE_PROVIDER_SITE_OTHER): Payer: Self-pay | Admitting: Student in an Organized Health Care Education/Training Program

## 2020-10-31 NOTE — Telephone Encounter (Signed)
Received documents from [facility]. Forms placed in Dr.Choudhari, Justin in box. Once MD reviews and signs, records will be faxed to number provided on forms. Once successfully faxed, records will be scanned into Epic and will be available under Media Tab for further viewing.

## 2020-11-01 ENCOUNTER — Encounter (INDEPENDENT_AMBULATORY_CARE_PROVIDER_SITE_OTHER): Payer: Self-pay | Admitting: Student in an Organized Health Care Education/Training Program

## 2020-11-01 DIAGNOSIS — F419 Anxiety disorder, unspecified: Secondary | ICD-10-CM

## 2020-11-01 NOTE — Telephone Encounter (Signed)
From: Alayzia Donegan  To: [name], MD  Sent: 11/01/2020 11:18 AM PDT  Subject: Anxiety     I have made a appointment for the psychiatrist butI can't get appointment until October 20 and I am really having a lot of issues I can't sleep in my cpap it feels like I'm suffocating and having a lot of anxiety attacks is it possible to get some of the Xanax until I can get in to see the psychiatrist please I need to be able to sleep in my cpap and help with anxiety

## 2020-11-02 ENCOUNTER — Encounter (INDEPENDENT_AMBULATORY_CARE_PROVIDER_SITE_OTHER): Payer: Self-pay | Admitting: Student in an Organized Health Care Education/Training Program

## 2020-11-02 ENCOUNTER — Other Ambulatory Visit (INDEPENDENT_AMBULATORY_CARE_PROVIDER_SITE_OTHER): Payer: Self-pay | Admitting: Student in an Organized Health Care Education/Training Program

## 2020-11-02 DIAGNOSIS — E1169 Type 2 diabetes mellitus with other specified complication: Secondary | ICD-10-CM

## 2020-11-02 DIAGNOSIS — I10 Essential (primary) hypertension: Secondary | ICD-10-CM

## 2020-11-02 MED ORDER — METOPROLOL TARTRATE 25 MG OR TABS
ORAL_TABLET | ORAL | 1 refills | Status: DC
Start: 2020-11-02 — End: 2020-11-05

## 2020-11-02 MED ORDER — ALPRAZOLAM 0.5 MG OR TABS
0.2500 mg | ORAL_TABLET | Freq: Every evening | ORAL | 0 refills | Status: DC | PRN
Start: 2020-11-02 — End: 2020-11-16

## 2020-11-02 NOTE — Telephone Encounter (Signed)
From: Jermaine Demaree  To: [name], MD  Sent: 11/02/2020 3:57 PM PDT  Subject: Anxiety medication     Thank you for filling the Xanax And I will be very careful and I will not take the pain medication while on the the Xanax

## 2020-11-05 MED ORDER — METOPROLOL TARTRATE 25 MG OR TABS
25.0000 mg | ORAL_TABLET | Freq: Two times a day (BID) | ORAL | 1 refills | Status: DC
Start: 2020-11-05 — End: 2021-01-02

## 2020-11-05 MED ORDER — DEXCOM G6 SENSOR MISC
0 refills | Status: DC
Start: 2020-11-05 — End: 2020-11-14

## 2020-11-05 MED ORDER — DEXCOM G6 RECEIVER DEVI
0 refills | Status: DC
Start: 2020-11-05 — End: 2020-11-14

## 2020-11-05 MED ORDER — DEXCOM G6 TRANSMITTER MISC
0 refills | Status: DC
Start: 2020-11-05 — End: 2020-11-14

## 2020-11-05 NOTE — Telephone Encounter (Addendum)
Please see pended orders and approve. This was sent to the wrong pharmacy originally.       Thank you

## 2020-11-05 NOTE — Telephone Encounter (Addendum)
Please see pended orders and approve if appropriate

## 2020-11-06 MED ORDER — LOSARTAN POTASSIUM 50 MG OR TABS
50.0000 mg | ORAL_TABLET | Freq: Every day | ORAL | Status: DC
Start: ? — End: 2020-11-06

## 2020-11-06 MED ORDER — LOSARTAN POTASSIUM 50 MG OR TABS
50.0000 mg | ORAL_TABLET | Freq: Every day | ORAL | 1 refills | Status: DC
Start: 2020-11-06 — End: 2021-04-17

## 2020-11-08 ENCOUNTER — Other Ambulatory Visit (INDEPENDENT_AMBULATORY_CARE_PROVIDER_SITE_OTHER): Payer: Self-pay | Admitting: Student in an Organized Health Care Education/Training Program

## 2020-11-08 DIAGNOSIS — R110 Nausea: Secondary | ICD-10-CM

## 2020-11-09 ENCOUNTER — Encounter (INDEPENDENT_AMBULATORY_CARE_PROVIDER_SITE_OTHER): Payer: Self-pay | Admitting: Student in an Organized Health Care Education/Training Program

## 2020-11-09 MED ORDER — ONDANSETRON 8 MG OR TBDP
8.0000 mg | ORAL_TABLET | Freq: Three times a day (TID) | ORAL | 0 refills | Status: DC | PRN
Start: 2020-11-09 — End: 2020-12-06

## 2020-11-12 ENCOUNTER — Other Ambulatory Visit (INDEPENDENT_AMBULATORY_CARE_PROVIDER_SITE_OTHER): Payer: Self-pay | Admitting: Student in an Organized Health Care Education/Training Program

## 2020-11-12 ENCOUNTER — Encounter (INDEPENDENT_AMBULATORY_CARE_PROVIDER_SITE_OTHER): Payer: Self-pay | Admitting: Student in an Organized Health Care Education/Training Program

## 2020-11-12 DIAGNOSIS — E1169 Type 2 diabetes mellitus with other specified complication: Secondary | ICD-10-CM

## 2020-11-12 DIAGNOSIS — E782 Mixed hyperlipidemia: Secondary | ICD-10-CM

## 2020-11-12 DIAGNOSIS — Z794 Long term (current) use of insulin: Secondary | ICD-10-CM

## 2020-11-12 DIAGNOSIS — K219 Gastro-esophageal reflux disease without esophagitis: Secondary | ICD-10-CM

## 2020-11-12 DIAGNOSIS — E119 Type 2 diabetes mellitus without complications: Secondary | ICD-10-CM

## 2020-11-12 MED ORDER — ASPIRIN LOW DOSE 81 MG PO TBEC
DELAYED_RELEASE_TABLET | ORAL | 1 refills | Status: DC
Start: 2020-11-12 — End: 2022-10-01

## 2020-11-12 MED ORDER — NOVOLOG FLEXPEN 100 UNIT/ML SC SOPN
20.0000 [IU] | PEN_INJECTOR | Freq: Three times a day (TID) | SUBCUTANEOUS | 1 refills | Status: DC | PRN
Start: 2020-11-12 — End: 2021-01-10

## 2020-11-12 MED ORDER — FAMOTIDINE 40 MG OR TABS
ORAL_TABLET | ORAL | 1 refills | Status: DC
Start: 2020-11-12 — End: 2021-07-10

## 2020-11-12 NOTE — Telephone Encounter (Signed)
See additional message, patient stated disregard.

## 2020-11-12 NOTE — Telephone Encounter (Signed)
From: Lasaundra Demartini  To: [name], MD  Sent: 11/09/2020 11:03 AM PDT  Subject: Dr Juleen China -Cymbalta    Dear Dr Jeannette How ,  I truly hate to be a pest to you but when I was in see Dr Juleen China He said that he would suggest me trying Cymbalta for my pain and to try to get me off of the Oxycodone I was wondering if you could send in a prescription if you agreed with his suggestion

## 2020-11-13 ENCOUNTER — Telehealth (INDEPENDENT_AMBULATORY_CARE_PROVIDER_SITE_OTHER): Payer: Self-pay | Admitting: Student in an Organized Health Care Education/Training Program

## 2020-11-13 NOTE — Telephone Encounter (Signed)
Received documents from [facility]. Forms placed in Dr.Choudhari, Justin in box. Once MD reviews and signs, records will be faxed to number provided on forms. Once successfully faxed, records will be scanned into Epic and will be available under Media Tab for further viewing.

## 2020-11-14 MED ORDER — DEXCOM G6 TRANSMITTER MISC
0 refills | Status: DC
Start: 2020-11-14 — End: 2020-12-11

## 2020-11-14 MED ORDER — DEXCOM G6 SENSOR MISC
0 refills | Status: DC
Start: 2020-11-14 — End: 2020-12-11

## 2020-11-14 MED ORDER — DEXCOM G6 RECEIVER DEVI
0 refills | Status: DC
Start: 2020-11-14 — End: 2020-12-12

## 2020-11-14 NOTE — Telephone Encounter (Signed)
From: Kimberly Curtis  To: [name], MD  Sent: 11/12/2020 3:48 PM PDT  Subject: Dexcom G 6    In order for me to get this at no cost the pharmacy said that their is some paperwork that you have to fill out before I can get the Dexcom G -6 Can you please fill this out so I can start getting my Dexcom G 6 products

## 2020-11-14 NOTE — Telephone Encounter (Signed)
S/w [employer] representative and was asked to fax RX to them with pt's demographics, insurance and they will assist the pt in receiving the devise.

## 2020-11-15 ENCOUNTER — Telehealth (INDEPENDENT_AMBULATORY_CARE_PROVIDER_SITE_OTHER): Payer: Self-pay | Admitting: Student in an Organized Health Care Education/Training Program

## 2020-11-15 DIAGNOSIS — F419 Anxiety disorder, unspecified: Secondary | ICD-10-CM

## 2020-11-15 NOTE — Telephone Encounter (Signed)
[id number] faxed with pt's demographics and insurance

## 2020-11-15 NOTE — Telephone Encounter (Signed)
General Inquiry     Who is calling: Ancillary: Sourith from [facility] is calling on behalf of patient    Reason for this call: Sourith calling in stating she has recently evaluated patient and patient has been experiencing a lot of anxiety. Pt has an appt with psychiatrist however appt is in October, Sourith is requesting if provider can increase qty of xanax. Per Sourith patient has been doing well with medication.   Please advise.     Action required by office: Please process request     Duplicate encounter? No previous documentation found on this issue.     Best way to contact: [phone]  Alternative: na    Inquiry has been read verbatim to this caller. Verbalizes satisfaction and confirms the above is accurate: yes    Has been advised this message will be transmitted to office and can expect a response within the next 24-72 hours.    Encounter created by [facility] MA.  If further action required please route encounter to appropriate in clinic MA/LVN/Resident Pool

## 2020-11-16 ENCOUNTER — Other Ambulatory Visit: Payer: Self-pay

## 2020-11-16 ENCOUNTER — Other Ambulatory Visit (INDEPENDENT_AMBULATORY_CARE_PROVIDER_SITE_OTHER): Payer: Self-pay | Admitting: Student in an Organized Health Care Education/Training Program

## 2020-11-16 DIAGNOSIS — F419 Anxiety disorder, unspecified: Secondary | ICD-10-CM

## 2020-11-16 MED ORDER — ALPRAZOLAM 0.5 MG OR TABS
0.2500 mg | ORAL_TABLET | Freq: Every evening | ORAL | 0 refills | Status: DC | PRN
Start: 2020-11-16 — End: 2020-11-22
  Filled 2020-11-16 – 2020-11-22 (×2): qty 14, 28d supply, fill #0

## 2020-11-16 NOTE — Telephone Encounter (Signed)
Rx refilled  Unable to reach pt  Will discuss starting Paxil per NV

## 2020-11-16 NOTE — Telephone Encounter (Signed)
Called pt twice, left VM  Spoke to husband [name], he will inform patient    Plan to discuss anxiety

## 2020-11-16 NOTE — Telephone Encounter (Signed)
Established with:  Last OV with PCP:  Next OV with Dept:  Future Appointments: Kimberly Curtis   10/12/2020  12/25/2020  Future Appointments   Date Time Provider [place]   11/21/2020  4:00 PM Myriam Forehand, MD [facility] [place]   12/25/2020  1:00 PM Kimberly Balint, MD EAS [facility]   01/02/2021 10:30 AM Eda Keys, MD [id number]   01/10/2021 10:30 AM Bishop, Alvester Chou, MD N[facility]   01/14/2021  1:00 PM Kimberly Balint, MD EAS [facility]        Medication requested:   Requested Prescriptions     Pending Prescriptions Disp Refills    ALPRAZolam (XANAX) 0.5 MG tablet 14 tablet 0     Sig: Take 0.5 tablets (0.25 mg) by mouth nightly as needed for Insomnia or Anxiety.       Last Filled Date 11/02/20   Quantity Last Filled  14   Refill x 0 refills   Send to:    [facility]  [address], [place] [address]  Phone: [phone] Fax: [phone]    [id number] [address] [place] [address] Phone: [phone]: [phone]     Current Medication(s):  Current Outpatient Medications   Medication Sig Dispense Refill    albuterol (PROVENTIL) (2.5 MG/3ML) 0.083% nebulization 3 mL (2.5 mg) by Nebulization route every 4 hours as needed for Wheezing. 1 each 2    Albuterol Sulfate 108 (90 Base) MCG/ACT AEPB Inhale 1 puff by mouth as needed.      ALPRAZolam (XANAX) 0.5 MG tablet Take 0.5 tablets (0.25 mg) by mouth nightly as needed for Insomnia or Anxiety. 14 tablet 0    ASPIRIN LOW DOSE 81 MG EC tablet TAKE 1 TABLET BY MOUTH EVERY DAY 120 tablet 1    atorvastatin (LIPITOR) 40 MG tablet Take 1 tablet (40 mg) by mouth daily. 90 tablet 3    Continuous Blood Gluc Receiver (CONTINUOUS BLOOD GLUCOSE, DEXCOM G6, RECEIVER) Use daily to read glucose topically 1 each 0    Continuous Blood Gluc Sensor (CONTINUOUS BLOOD GLUCOSE, DEXCOM G6, SENSOR) Insert subcutaneously every 10 days. 3  each 0    Continuous Blood Gluc Transmit (CONTINUOUS BLOOD GLUCOSE, DEXCOM G6, TRANSMITTER) Use daily to transmit subcutaneous glucose value. Change every 3 months. 2 each 0    famotidine (PEPCID) 40 MG tablet TAKE 1 TABLET BY MOUTH EVERY DAY 90 tablet 1    fexofenadine (ALLEGRA) 180 MG tablet Take 1 tablet (180 mg) by mouth daily. 90 tablet 1    fluticasone-umeclidinium-vilanterol (TRELEGY ELLIPTA) 100-62.5-25 MCG/INH AEPB Inhale 1 puff by mouth daily.      folic acid (FOLVITE) 1 MG tablet Take 1 tablet (1 mg) by mouth daily. 90 tablet 1    Hydrocod Polst-CPM Polst ER 10-8 MG/5ML SUER Take 5 mL by mouth every 12 hours as needed (cough). 240 mL 0    HYDROCODONE-CHLORPHENIRAMINE PO 5 mL. Every 12 hours as needed for cough      hydrOXYzine HCL (ATARAX) 50 MG tablet TAKE 1 TABLET BY MOUTH 2 TIMES DAILY AS NEEDED FOR ITCHING 180 tablet 1    insulin glargine (LANTUS SOLOSTAR) 100 units/mL injection pen Inject 48 Units under the skin nightly. 15 mL 2    losartan (COZAAR) 50 MG tablet Take 1 tablet (50 mg) by mouth daily. 90 tablet 1  metoprolol tartrate (LOPRESSOR) 25 MG tablet Take 1 tablet (25 mg) by mouth 2 times daily. 180 tablet 1    montelukast (SINGULAIR) 10 MG tablet Take 1 tablet (10 mg) by mouth every evening. 90 tablet 1    naloxone (NARCAN) 4 mg/0.1 mL nasal spray Spray 1 spray into one nostril once as needed.      NOVOLOG FLEXPEN 100 UNIT/ML injection pen INJECT 20 UNITS UNDER THE SKIN 3 TIMES DAILY AS NEEDED FOR HIGH BLOOD SUGAR. AM: 20 UNITS + SLIDING SCALE MID DAY: 17 + SLIDING SCALE AND PM 12 + SLIDING SCALE 15 mL 1    ondansetron (ZOFRAN ODT) 8 MG disintegrating tablet TAKE 1 TABLET (8 MG) ON OR UNDER THE TONGUE EVERY 8 HOURS AS NEEDED FOR NAUSEA/VOMITING. 30 tablet 0    oxycodone-acetaminophen (PERCOCET) 10-325 MG tablet Take 1 tablet by mouth every 8 hours as needed for Severe Pain (Pain Score 7-10). 90 tablet 0    pantoprazole (PROTONIX) 40 MG tablet Take 1 tablet (40 mg) by mouth  every morning (before breakfast). 90 tablet 1    predniSONE (DELTASONE) 10 MG tablet Take 1 tablet (10 mg) by mouth daily. 90 tablet 1    pregabalin (LYRICA) 75 MG capsule Take 1 capsule (75 mg) by mouth 2 times daily. 1 tablet AM and 2 tablets PM 60 capsule 2    TECHLITE PEN NEEDLES 32G X 4 MM MISC USE ONE PEN NEEDLE WITH EACH INSULIN ADMINISTRATION. 200 each 11     No current facility-administered medications for this visit.       Patient information: Allergies   Allergen Reactions    Sulfa Drugs Anaphylaxis    Morphine Itching      Health Maintenance Due   Topic Date Due    Breast Cancer Screen  Never done    Diabetic Retinal Exam  Never done    Pneumococcal Vaccine (1 - PCV) Never done    Medicare Annual Wellness Visit  Never done    PHQ9 Depression Monitoring doc flowsheet  11/08/2020      Last labs:  Lab Results   Component Value Date    CHOL 217 10/30/2020    HDL 72 10/30/2020    LDLCALC 119 10/30/2020    TRIG 129 10/30/2020    TSH 1.29 07/09/2020    A1C 7.3 (H) 10/30/2020      Blood Pressure   10/12/20 150/62   09/18/20 123/88   08/27/20 146/70    No components found for: 81M

## 2020-11-16 NOTE — Telephone Encounter (Signed)
[facility]       Controlled substances are not currently included in Pharmacy Refill Clinic protocol. Re-routing to appropriate staff for processing. Thank you.

## 2020-11-16 NOTE — Telephone Encounter (Signed)
Incoming call from home health agency: Kimberly Curtis name: [facility]  requesting refill.     Kimberly Curtis asking for a refill of Xanax due to Pt is scheduled to be seen by her Psychiatrist by January 10, 2021.     Established with: Kimberly Curtis   Last OV with PCP: 10/12/2020   Next OV with PCP: 12/25/2020   Last OV in department: 10/30/2020   Next OV in department: 12/25/2020    Requested Medication(s):    ALPRAZolam (XANAX) 0.5 MG tablet            Allergies   Allergen Reactions    Sulfa Drugs Anaphylaxis    Morphine Itching        [facility]:     [id number] [place] [place] [facility] [address] Phone: [phone]: [phone]      Last labs:   Lab Results   Component Value Date    CHOL 217 10/30/2020    HDL 72 10/30/2020    LDLCALC 119 10/30/2020    TRIG 129 10/30/2020    TSH 1.29 07/09/2020    A1C 7.3 (H) 10/30/2020      Blood Pressure   10/12/20 150/62   09/18/20 123/88   08/27/20 146/70      Health Maintenance Due   Topic Date Due    Breast Cancer Screen  Never done    Diabetic Retinal Exam  Never done    Pneumococcal Vaccine (1 - PCV) Never done    Medicare Annual Wellness Visit  Never done    PHQ9 Depression Monitoring doc flowsheet  11/08/2020        Current Medication(s):   Current Outpatient Medications   Medication Sig Dispense Refill    albuterol (PROVENTIL) (2.5 MG/3ML) 0.083% nebulization 3 mL (2.5 mg) by Nebulization route every 4 hours as needed for Wheezing. 1 each 2    Albuterol Sulfate 108 (90 Base) MCG/ACT AEPB Inhale 1 puff by mouth as needed.      ALPRAZolam (XANAX) 0.5 MG tablet Take 0.5 tablets (0.25 mg) by mouth nightly as needed for Insomnia or Anxiety. 14 tablet 0    ASPIRIN LOW DOSE 81 MG EC tablet TAKE 1 TABLET BY MOUTH EVERY DAY 120 tablet 1    atorvastatin (LIPITOR) 40 MG tablet Take 1 tablet (40 mg) by mouth daily. 90 tablet 3    Continuous Blood Gluc Receiver (CONTINUOUS BLOOD GLUCOSE, DEXCOM G6, RECEIVER) Use daily to read  glucose topically 1 each 0    Continuous Blood Gluc Sensor (CONTINUOUS BLOOD GLUCOSE, DEXCOM G6, SENSOR) Insert subcutaneously every 10 days. 3 each 0    Continuous Blood Gluc Transmit (CONTINUOUS BLOOD GLUCOSE, DEXCOM G6, TRANSMITTER) Use daily to transmit subcutaneous glucose value. Change every 3 months. 2 each 0    famotidine (PEPCID) 40 MG tablet TAKE 1 TABLET BY MOUTH EVERY DAY 90 tablet 1    fexofenadine (ALLEGRA) 180 MG tablet Take 1 tablet (180 mg) by mouth daily. 90 tablet 1    fluticasone-umeclidinium-vilanterol (TRELEGY ELLIPTA) 100-62.5-25 MCG/INH AEPB Inhale 1 puff by mouth daily.      folic acid (FOLVITE) 1 MG tablet Take 1 tablet (1 mg) by mouth daily. 90 tablet 1    Hydrocod Polst-CPM Polst ER 10-8 MG/5ML SUER Take 5 mL by mouth every 12 hours as needed (cough). 240 mL 0    HYDROCODONE-CHLORPHENIRAMINE PO 5 mL. Every 12 hours as needed for cough  hydrOXYzine HCL (ATARAX) 50 MG tablet TAKE 1 TABLET BY MOUTH 2 TIMES DAILY AS NEEDED FOR ITCHING 180 tablet 1    insulin glargine (LANTUS SOLOSTAR) 100 units/mL injection pen Inject 48 Units under the skin nightly. 15 mL 2    losartan (COZAAR) 50 MG tablet Take 1 tablet (50 mg) by mouth daily. 90 tablet 1    metoprolol tartrate (LOPRESSOR) 25 MG tablet Take 1 tablet (25 mg) by mouth 2 times daily. 180 tablet 1    montelukast (SINGULAIR) 10 MG tablet Take 1 tablet (10 mg) by mouth every evening. 90 tablet 1    naloxone (NARCAN) 4 mg/0.1 mL nasal spray Spray 1 spray into one nostril once as needed.      NOVOLOG FLEXPEN 100 UNIT/ML injection pen INJECT 20 UNITS UNDER THE SKIN 3 TIMES DAILY AS NEEDED FOR HIGH BLOOD SUGAR. AM: 20 UNITS + SLIDING SCALE MID DAY: 17 + SLIDING SCALE AND PM 12 + SLIDING SCALE 15 mL 1    ondansetron (ZOFRAN ODT) 8 MG disintegrating tablet TAKE 1 TABLET (8 MG) ON OR UNDER THE TONGUE EVERY 8 HOURS AS NEEDED FOR NAUSEA/VOMITING. 30 tablet 0    oxycodone-acetaminophen (PERCOCET) 10-325 MG tablet Take 1 tablet by mouth  every 8 hours as needed for Severe Pain (Pain Score 7-10). 90 tablet 0    pantoprazole (PROTONIX) 40 MG tablet Take 1 tablet (40 mg) by mouth every morning (before breakfast). 90 tablet 1    predniSONE (DELTASONE) 10 MG tablet Take 1 tablet (10 mg) by mouth daily. 90 tablet 1    pregabalin (LYRICA) 75 MG capsule Take 1 capsule (75 mg) by mouth 2 times daily. 1 tablet AM and 2 tablets PM 60 capsule 2    TECHLITE PEN NEEDLES 32G X 4 MM MISC USE ONE PEN NEEDLE WITH EACH INSULIN ADMINISTRATION. 200 each 11     No current facility-administered medications for this visit.        Encounter created by Care Assist MA.  If further action required please route encounter to appropriate in clinic MA/LVN/Resident pool.

## 2020-11-17 ENCOUNTER — Other Ambulatory Visit: Payer: Self-pay

## 2020-11-17 ENCOUNTER — Encounter (INDEPENDENT_AMBULATORY_CARE_PROVIDER_SITE_OTHER): Payer: Self-pay | Admitting: Student in an Organized Health Care Education/Training Program

## 2020-11-19 ENCOUNTER — Other Ambulatory Visit: Payer: Self-pay

## 2020-11-20 ENCOUNTER — Encounter (INDEPENDENT_AMBULATORY_CARE_PROVIDER_SITE_OTHER): Payer: Self-pay | Admitting: Student in an Organized Health Care Education/Training Program

## 2020-11-21 ENCOUNTER — Telehealth (INDEPENDENT_AMBULATORY_CARE_PROVIDER_SITE_OTHER): Payer: Medicare Other | Admitting: Family Practice

## 2020-11-21 ENCOUNTER — Emergency Department (HOSPITAL_BASED_OUTPATIENT_CLINIC_OR_DEPARTMENT_OTHER): Payer: Medicare Other

## 2020-11-21 ENCOUNTER — Encounter (INDEPENDENT_AMBULATORY_CARE_PROVIDER_SITE_OTHER): Payer: Self-pay | Admitting: Family Practice

## 2020-11-21 ENCOUNTER — Other Ambulatory Visit: Payer: Self-pay

## 2020-11-21 ENCOUNTER — Encounter (INDEPENDENT_AMBULATORY_CARE_PROVIDER_SITE_OTHER): Payer: Medicare Other | Admitting: Family Practice

## 2020-11-21 ENCOUNTER — Emergency Department
Admission: EM | Admit: 2020-11-21 | Discharge: 2020-11-21 | Disposition: A | Discharge status: Home / Self Care | Payer: Medicare Other | Attending: Emergency Medicine | Admitting: Emergency Medicine

## 2020-11-21 DIAGNOSIS — Z1339 Encounter for screening examination for other mental health and behavioral disorders: Secondary | ICD-10-CM

## 2020-11-21 DIAGNOSIS — D869 Sarcoidosis, unspecified: Secondary | ICD-10-CM | POA: Insufficient documentation

## 2020-11-21 DIAGNOSIS — F419 Anxiety disorder, unspecified: Secondary | ICD-10-CM

## 2020-11-21 DIAGNOSIS — R0602 Shortness of breath: Secondary | ICD-10-CM

## 2020-11-21 DIAGNOSIS — Z9049 Acquired absence of other specified parts of digestive tract: Secondary | ICD-10-CM | POA: Insufficient documentation

## 2020-11-21 DIAGNOSIS — Z79899 Other long term (current) drug therapy: Secondary | ICD-10-CM | POA: Insufficient documentation

## 2020-11-21 DIAGNOSIS — E119 Type 2 diabetes mellitus without complications: Secondary | ICD-10-CM | POA: Insufficient documentation

## 2020-11-21 DIAGNOSIS — Z8249 Family history of ischemic heart disease and other diseases of the circulatory system: Secondary | ICD-10-CM | POA: Insufficient documentation

## 2020-11-21 DIAGNOSIS — R059 Cough, unspecified: Secondary | ICD-10-CM

## 2020-11-21 DIAGNOSIS — Z1389 Encounter for screening for other disorder: Secondary | ICD-10-CM

## 2020-11-21 DIAGNOSIS — J811 Chronic pulmonary edema: Secondary | ICD-10-CM | POA: Insufficient documentation

## 2020-11-21 DIAGNOSIS — Z7951 Long term (current) use of inhaled steroids: Secondary | ICD-10-CM | POA: Insufficient documentation

## 2020-11-21 DIAGNOSIS — Z794 Long term (current) use of insulin: Secondary | ICD-10-CM | POA: Insufficient documentation

## 2020-11-21 DIAGNOSIS — Z7952 Long term (current) use of systemic steroids: Secondary | ICD-10-CM | POA: Insufficient documentation

## 2020-11-21 DIAGNOSIS — Z20822 Contact with and (suspected) exposure to covid-19: Secondary | ICD-10-CM | POA: Insufficient documentation

## 2020-11-21 DIAGNOSIS — Z87891 Personal history of nicotine dependence: Secondary | ICD-10-CM | POA: Insufficient documentation

## 2020-11-21 DIAGNOSIS — J810 Acute pulmonary edema: Secondary | ICD-10-CM

## 2020-11-21 DIAGNOSIS — I10 Essential (primary) hypertension: Secondary | ICD-10-CM | POA: Insufficient documentation

## 2020-11-21 LAB — CBC WITH DIFF, BLOOD
ANC-Automated: 6.6 10*3/uL (ref 1.6–7.0)
Abs Basophils: 0 10*3/uL (ref <0.2)
Abs Eosinophils: 0.1 10*3/uL (ref 0.0–0.5)
Abs Lymphs: 1.5 10*3/uL (ref 0.8–3.1)
Abs Monos: 1 10*3/uL — ABNORMAL HIGH (ref 0.2–0.8)
Basophils: 0 %
Eosinophils: 1 %
Hct: 35.6 % (ref 34.0–45.0)
Hgb: 10.2 gm/dL — ABNORMAL LOW (ref 11.2–15.7)
Imm Gran %: 1 % — ABNORMAL HIGH (ref <1)
Imm Gran Abs: 0.1 10*3/uL — ABNORMAL HIGH (ref <0.1)
Lymphocytes: 16 %
MCH: 24.7 pg — ABNORMAL LOW (ref 26.0–32.0)
MCHC: 28.7 g/dL — ABNORMAL LOW (ref 32.0–36.0)
MCV: 86.2 um3 (ref 79.0–95.0)
MPV: 11.1 fL (ref 9.4–12.4)
Monocytes: 11 %
Plt Count: 251 10*3/uL (ref 140–370)
RBC: 4.13 10*6/uL (ref 3.90–5.20)
RDW: 17 % — ABNORMAL HIGH (ref 12.0–14.0)
Segs: 71 %
WBC: 9.3 10*3/uL (ref 4.0–10.0)

## 2020-11-21 LAB — HIV 1/2 ANTIBODY & P24 ANTIGEN ASSAY, BLOOD: HIV 1/2 Antibody & P24 Antigen Assay: NONREACTIVE

## 2020-11-21 LAB — COMPREHENSIVE METABOLIC PANEL, BLOOD
ALT (SGPT): 26 U/L (ref 0–33)
AST (SGOT): 15 U/L (ref 0–32)
Albumin: 4.1 g/dL (ref 3.5–5.2)
Alkaline Phos: 138 U/L — ABNORMAL HIGH (ref 40–130)
Anion Gap: 9 mmol/L (ref 7–15)
BUN: 15 mg/dL (ref 8–23)
Bicarbonate: 30 mmol/L — ABNORMAL HIGH (ref 22–29)
Bilirubin, Tot: 0.21 mg/dL (ref <1.2)
Calcium: 9.4 mg/dL (ref 8.5–10.6)
Chloride: 103 mmol/L (ref 98–107)
Creatinine: 0.97 mg/dL — ABNORMAL HIGH (ref 0.51–0.95)
GFR: >60 mL/min
Glucose: 237 mg/dL — ABNORMAL HIGH (ref 70–99)
Potassium: 4.2 mmol/L (ref 3.5–5.1)
Sodium: 142 mmol/L (ref 136–145)
Total Protein: 6.6 g/dL (ref 6.0–8.0)
eGFR Based on CKD-EPI 2021 Equation: >60 mL/min

## 2020-11-21 LAB — CALCIUM, IONIZED, VENOUS BLOOD GAS: Calcium, Ven: 1.15 mmol/L (ref 1.13–1.32)

## 2020-11-21 LAB — VENOUS BLOOD GAS
BE, Ven: 7.7 mmol/L — ABNORMAL HIGH (ref <=2.3)
FIO2, Ven: 32 %
HCO3, Ven: 31 mmol/L — ABNORMAL HIGH (ref 23–28)
O2 Sat, Ven (Est): 79.7 %
Temp, Ven: 37.1 'C
pCO2, Ven (T): 59 mmHg — ABNORMAL HIGH (ref 40–52)
pCO2, Ven (Uncorr): 59 mmHg — ABNORMAL HIGH (ref 40–52)
pH, Ven (T): 7.38 (ref 7.33–7.40)
pH, Ven (Uncorr): 7.38 (ref 7.33–7.40)
pO2, Ven (T): 45 mmHg — ABNORMAL HIGH (ref 25–44)
pO2, Ven (Uncorr): 45 mmHg — ABNORMAL HIGH (ref 25–44)

## 2020-11-21 LAB — INFLUENZA A/B & SARS-COV-2 PCR COMBO FOR RAPID RESPONSE LAB
Influenza A PCR, RRL: NOT DETECTED
Influenza B PCR, RRL: NOT DETECTED
SARS-CoV-2 PCR, RRL: NOT DETECTED

## 2020-11-21 LAB — GLUCOSE, VENOUS BLOOD GAS: Glucose, Ven: 221 mg/dL — ABNORMAL HIGH (ref 65–110)

## 2020-11-21 LAB — PRO BNP, BLOOD: BNPP: 351 pg/mL (ref 0–899)

## 2020-11-21 LAB — POTASSIUM, VENOUS BLOOD GAS: Potassium, Ven: 4.1 mmol/L (ref 3.5–5.0)

## 2020-11-21 LAB — SODIUM, VENOUS BLOOD GAS: Sodium, Ven: 139 mmol/L (ref 135–145)

## 2020-11-21 MED ORDER — IPRATROPIUM-ALBUTEROL 0.5-2.5 (3) MG/3ML IN SOLN
3.0000 mL | RESPIRATORY_TRACT | Status: DC | PRN
Start: 2020-11-21 — End: 2020-11-22

## 2020-11-21 MED ORDER — HYDROMORPHONE HCL 1 MG/ML IJ SOLN
1.0000 mg | Freq: Once | INTRAMUSCULAR | Status: AC
Start: 2020-11-21 — End: 2020-11-21
  Administered 2020-11-21 (×2): 1 mg via INTRAVENOUS
  Filled 2020-11-21: qty 1

## 2020-11-21 MED ORDER — PREDNISONE 20 MG OR TABS
40.0000 mg | ORAL_TABLET | Freq: Every day | ORAL | 0 refills | Status: DC
Start: 2020-11-21 — End: 2020-11-21
  Filled 2020-11-21: qty 10, 5d supply, fill #0

## 2020-11-21 MED ORDER — IPRATROPIUM-ALBUTEROL 0.5-2.5 (3) MG/3ML IN SOLN
3.0000 mL | Freq: Four times a day (QID) | RESPIRATORY_TRACT | Status: DC
Start: 2020-11-21 — End: 2020-11-22
  Administered 2020-11-21 (×2): 3 mL via RESPIRATORY_TRACT
  Filled 2020-11-21: qty 1

## 2020-11-21 MED ORDER — HYDROMORPHONE HCL 1 MG/ML IJ SOLN
1.0000 mg | Freq: Once | INTRAMUSCULAR | Status: AC
Start: 2020-11-21 — End: 2020-11-21
  Administered 2020-11-21 (×2): 1 mg via INTRAVENOUS
  Filled 2020-11-21: qty 1

## 2020-11-21 MED ORDER — IPRATROPIUM-ALBUTEROL 0.5-2.5 (3) MG/3ML IN SOLN
3.0000 mL | Freq: Once | RESPIRATORY_TRACT | Status: AC
Start: 2020-11-21 — End: 2020-11-21
  Administered 2020-11-21 (×2): 3 mL via RESPIRATORY_TRACT
  Filled 2020-11-21: qty 1

## 2020-11-21 MED ORDER — METHYLPREDNISOLONE SODIUM SUCC 125 MG IJ SOLR CUSTOM
125.0000 mg | Freq: Once | INTRAMUSCULAR | Status: AC
Start: 2020-11-21 — End: 2020-11-21
  Administered 2020-11-21 (×2): 125 mg via INTRAVENOUS
  Filled 2020-11-21: qty 125

## 2020-11-21 MED ORDER — ALBUTEROL SULFATE (5 MG/ML) 0.5% IN NEBU
2.5000 mg | INHALATION_SOLUTION | Freq: Once | RESPIRATORY_TRACT | Status: DC
Start: 2020-11-21 — End: 2020-11-21

## 2020-11-21 MED ORDER — AMOXICILLIN-POT CLAVULANATE 875-125 MG OR TABS
1.0000 | ORAL_TABLET | Freq: Once | ORAL | Status: AC
Start: 2020-11-21 — End: 2020-11-21
  Administered 2020-11-21 (×2): 1 via ORAL
  Filled 2020-11-21: qty 1

## 2020-11-21 MED ORDER — ALBUTEROL SULFATE 108 (90 BASE) MCG/ACT IN AERS
2.0000 | INHALATION_SPRAY | Freq: Four times a day (QID) | RESPIRATORY_TRACT | 0 refills | Status: DC | PRN
Start: 2020-11-21 — End: 2022-08-07

## 2020-11-21 MED ORDER — DOXYCYCLINE 100 MG PO TABS OR CAPS
100.0000 mg | ORAL_TABLET | Freq: Once | ORAL | Status: DC
Start: 2020-11-21 — End: 2020-11-21
  Filled 2020-11-21: qty 1

## 2020-11-21 MED ORDER — IPRATROPIUM-ALBUTEROL 0.5-2.5 (3) MG/3ML IN SOLN
3.0000 mL | Freq: Once | RESPIRATORY_TRACT | Status: AC
Start: 2020-11-21 — End: 2020-11-21
  Administered 2020-11-21 (×2): 3 mL via RESPIRATORY_TRACT
  Filled 2020-11-21: qty 1

## 2020-11-21 MED ORDER — SODIUM CHLORIDE 0.9 % IV SOLN
1000.0000 mg | INTRAVENOUS | Status: DC
Start: 2020-11-21 — End: 2020-11-21
  Administered 2020-11-21 (×2): 1000 mg via INTRAVENOUS
  Filled 2020-11-21: qty 1000

## 2020-11-21 MED ORDER — AMOXICILLIN-POT CLAVULANATE 875-125 MG OR TABS
1.0000 | ORAL_TABLET | Freq: Two times a day (BID) | ORAL | 0 refills | Status: DC
Start: 2020-11-21 — End: 2020-11-21
  Filled 2020-11-21: qty 10, 5d supply, fill #0

## 2020-11-21 MED ORDER — ALBUTEROL SULFATE 108 (90 BASE) MCG/ACT IN AERS
2.0000 | INHALATION_SPRAY | Freq: Four times a day (QID) | RESPIRATORY_TRACT | 0 refills | Status: DC | PRN
Start: 2020-11-21 — End: 2020-11-21
  Filled 2020-11-21: qty 1, fill #0

## 2020-11-21 MED ORDER — AMOXICILLIN-POT CLAVULANATE 875-125 MG OR TABS
1.0000 | ORAL_TABLET | Freq: Two times a day (BID) | ORAL | 0 refills | Status: AC
Start: 2020-11-21 — End: 2020-11-26

## 2020-11-21 MED ORDER — PREDNISONE 20 MG OR TABS
40.0000 mg | ORAL_TABLET | Freq: Every day | ORAL | 0 refills | Status: AC
Start: 2020-11-21 — End: 2020-11-26

## 2020-11-21 NOTE — Patient Instructions (Signed)
Please go to the ER for assessment of your shortness of breath and evaluation for lung infection.

## 2020-11-21 NOTE — ED Provider Notes (Signed)
EMERGENCY DEPARTMENT NOTE  [facility] electronic medical record reviewed for pertinent medical history.     Patient: Kimberly Curtis, MRN [id number], DOB September 05, 1955  The Date of Service for the Emergency Room encounter is 11/21/2020  3:21 PM     Nursing triage CC:  Chief Complaint   Patient presents with    Difficulty Breathing     Ambulatory into ED with c/o increased difficulty breathing, productive cough (green/yellow sputum) x4 days despite compliance w/nebulizer & inhaler.        HPI:  Kimberly Curtis is a 65 year old female with a signficant past medical history of sarcoidosis, diabetes, hypertension presenting to the ED with shortness of breath.  Patient states that last week she started developing shortness breath and productive cough.  This continued up until this weekend.  On Sunday she went to [facility] for evaluation.  At that time she was treated with a nebulizer which provided her with relief.  They discharged her with antibiotics but she did not take these because she has history of V-tach VFib arrhythmias and states that the medication they gave her could cause that.  She is here today with the same productive cough and shortness of breath.  She has been taking her medications as directed.  Patient denies fever, chills, chest pain, abdominal pain, diarrhea.      Past Medical History:   Past Medical History:   Diagnosis Date    Diabetes mellitus (CMS-HCC)     Hypertension     Sarcoidosis of lung (CMS-HCC)     bilat       Past Surgical history:     Past Surgical History:   Procedure Laterality Date    CHOLECYSTECTOMY      HYSTERECTOMY      KNEE ARTHROSCOPY Left        Family History:   Family History   Problem Relation Name Age of Onset    Diabetes Mother      Heart Disease Mother      Kidney Disease Mother      Hypertension Father      Alcohol/Drug Father         Social History:  Social History     Socioeconomic History    Marital status: Married   Tobacco Use    Smoking status: Former Smoker     Smokeless tobacco: Never Used   Substance and Sexual Activity    Alcohol use: Not Currently    Drug use: Never     Alcohol: Denies  Tobacco: Denies  Illicit drugs: Denies  Housed    Medications:   Prior to Admission Medications   Prescriptions Last Dose Informant Patient Reported? Taking?   ALPRAZolam (XANAX) 0.5 MG tablet   No No   Sig: Take one-half tablet (0.25 mg) by mouth nightly as needed for Insomnia or Anxiety.   ASPIRIN LOW DOSE 81 MG EC tablet   No No   Sig: TAKE 1 TABLET BY MOUTH EVERY DAY   Albuterol Sulfate 108 (90 Base) MCG/ACT AEPB   Yes No   Sig: Inhale 1 puff by mouth as needed.   Continuous Blood Gluc Receiver (CONTINUOUS BLOOD GLUCOSE, DEXCOM G6, RECEIVER)   No No   Sig: Use daily to read glucose topically   Continuous Blood Gluc Sensor (CONTINUOUS BLOOD GLUCOSE, DEXCOM G6, SENSOR)   No No   Sig: Insert subcutaneously every 10 days.   Continuous Blood Gluc Transmit (CONTINUOUS BLOOD GLUCOSE, DEXCOM G6, TRANSMITTER)   No No  Sig: Use daily to transmit subcutaneous glucose value. Change every 3 months.   HYDROCODONE-CHLORPHENIRAMINE PO   Yes No   Sig: 5 mL. Every 12 hours as needed for cough   Hydrocod Polst-CPM Polst ER 10-8 MG/5ML SUER   No No   Sig: Take 5 mL by mouth every 12 hours as needed (cough).   NOVOLOG FLEXPEN 100 UNIT/ML injection pen   No No   Sig: INJECT 20 UNITS UNDER THE SKIN 3 TIMES DAILY AS NEEDED FOR HIGH BLOOD SUGAR. AM: 20 UNITS + SLIDING SCALE MID DAY: 17 + SLIDING SCALE AND PM 12 + SLIDING SCALE   TECHLITE PEN NEEDLES 32G X 4 MM MISC   No No   Sig: USE ONE PEN NEEDLE WITH EACH INSULIN ADMINISTRATION.   albuterol (PROVENTIL) (2.5 MG/3ML) 0.083% nebulization   No No   Sig: 3 mL (2.5 mg) by Nebulization route every 4 hours as needed for Wheezing.   atorvastatin (LIPITOR) 40 MG tablet   No No   Sig: Take 1 tablet (40 mg) by mouth daily.   famotidine (PEPCID) 40 MG tablet   No No   Sig: TAKE 1 TABLET BY MOUTH EVERY DAY   fexofenadine (ALLEGRA) 180 MG tablet   No No   Sig: Take  1 tablet (180 mg) by mouth daily.   fluticasone-umeclidinium-vilanterol (TRELEGY ELLIPTA) 100-62.5-25 MCG/INH AEPB   Yes No   Sig: Inhale 1 puff by mouth daily.   folic acid (FOLVITE) 1 MG tablet   No No   Sig: Take 1 tablet (1 mg) by mouth daily.   hydrOXYzine HCL (ATARAX) 50 MG tablet   No No   Sig: TAKE 1 TABLET BY MOUTH 2 TIMES DAILY AS NEEDED FOR ITCHING   insulin glargine (LANTUS SOLOSTAR) 100 units/mL injection pen   No No   Sig: Inject 48 Units under the skin nightly.   losartan (COZAAR) 50 MG tablet   No No   Sig: Take 1 tablet (50 mg) by mouth daily.   metoprolol tartrate (LOPRESSOR) 25 MG tablet   No No   Sig: Take 1 tablet (25 mg) by mouth 2 times daily.   montelukast (SINGULAIR) 10 MG tablet   No No   Sig: Take 1 tablet (10 mg) by mouth every evening.   naloxone (NARCAN) 4 mg/0.1 mL nasal spray   Yes No   Sig: Spray 1 spray into one nostril once as needed.   ondansetron (ZOFRAN ODT) 8 MG disintegrating tablet   No No   Sig: TAKE 1 TABLET (8 MG) ON OR UNDER THE TONGUE EVERY 8 HOURS AS NEEDED FOR NAUSEA/VOMITING.   oxycodone-acetaminophen (PERCOCET) 10-325 MG tablet   No No   Sig: Take 1 tablet by mouth every 8 hours as needed for Severe Pain (Pain Score 7-10).   pantoprazole (PROTONIX) 40 MG tablet   No No   Sig: Take 1 tablet (40 mg) by mouth every morning (before breakfast).   predniSONE (DELTASONE) 10 MG tablet   No No   Sig: Take 1 tablet (10 mg) by mouth daily.   pregabalin (LYRICA) 75 MG capsule   No No   Sig: Take 1 capsule (75 mg) by mouth 2 times daily. 1 tablet AM and 2 tablets PM      Facility-Administered Medications: None       Allergies:   Sulfa drugs and Morphine    Review of Systems:  All other systems reviewed and negative unless otherwise noted in the HPI or above.  This was done per my custom and practice for systems appropriate to the chief complaint in an emergency department setting and varies depending on the quality of history that the patient is able to provide.    Physical Exam:     11/21/20  2009 11/21/20  2016 11/21/20  2100 11/21/20  2115   BP:   (!) 117/42 124/66   Pulse: 74 77 95 92   Resp: 12 14 18 20    Temp:    97.9 F (36.6 C)   SpO2: 99% 99% 99% 98%     General: NAD, chronically ill-appearing on 3 L of oxygen  HEENT: normocephalic atraumatic, MMM, open and clear oropharynx, PERRL, EOMI  Neck: normal range of motion, no cervical spine tenderness,  trachea midline   Cardiac: reg rate and rhythm, no gallups/rubs/murmurs, no peripheral edema, no chest wall tenderness, radial pulses 2+ b/l  Resp:  Tachypnea, nonlabored, clear to auscultation bilaterally, mild wheezes in the right upper lung fields  Abd: soft, no guarding or rigidity, nontender, normal bowel sounds  Back: No midline spine tenderness, no CVAT  MSK: no gross deformities or obvious bleeding  Neuro: A/O x 4, moves all four extremities spontaneously, normal eye movements, no aphasia, CN II-XII intact, sensation to light touch intact  Psych: good eye contact, not responding to internal stimuli, appropriate mood and affect  Derm: no obvious rashes or lesions      Labs:  Labs Reviewed   COMPREHENSIVE METABOLIC PANEL, BLOOD - Abnormal; Notable for the following components:       Result Value    Glucose 237 (*)     Creatinine 0.97 (*)     Bicarbonate 30 (*)     Alkaline Phos 138 (*)     All other components within normal limits   VENOUS BLOOD GAS - Abnormal; Notable for the following components:    pCO2, Ven (T) 59 (*)     pO2, Ven (T) 45 (*)     HCO3, Ven 31 (*)     BE, Ven 7.7 (*)     pCO2, Ven (Uncorr) 59 (*)     pO2, Ven (Uncorr) 45 (*)     All other components within normal limits   GLUCOSE, VENOUS BLOOD GAS - Abnormal; Notable for the following components:    Glucose, Ven 221 (*)     All other components within normal limits   CBC WITH DIFF, BLOOD - Abnormal; Notable for the following components:    Hgb 10.2 (*)     MCH 24.7 (*)     MCHC 28.7 (*)     RDW 17.0 (*)     Imm Gran % 1 (*)     Imm Gran Abs 0.1 (*)     Abs Monos  1.0 (*)     All other components within normal limits   HIV 1/2 ANTIBODY & P24 ANTIGEN ASSAY, BLOOD   SODIUM, VENOUS BLOOD GAS   POTASSIUM, VENOUS BLOOD GAS   CALCIUM, IONIZED, VENOUS BLOOD GAS   PRO BNP, BLOOD   INFLUENZA A/B & SARS-COV-2 PCR COMBO FOR RAPID RESPONSE LAB    Narrative:     Collect using a flocked swab in the designated M4RT viral  transport medium. Other swab types or VTM tubes will be  rejected.  Is this COVID-19 test being ordered as screening prior to  surgery or another elective procedure?->Yes   UPPER RESPIRATORY PATHOGEN NUCLEIC ACID DETECTION TEST       Imaging:  X-Ray  Chest Single View   Final Result   IMPRESSION:   Mild interstitial pulmonary edema.             Meds administered:  Medications   ipratropium-albuterol (DUO-NEB) 0.5-3 MG/3ML nebulizer solution 3 mL (3 mL Nebulization Given 11/21/20 2002)   ipratropium-albuterol (DUO-NEB) 0.5-3 MG/3ML nebulizer solution 3 mL (has no administration in time range)   methylPREDNISolone sodium succinate (SOLU-MEDROL) injection 125 mg (125 mg IntraVENOUS Given 11/21/20 1621)   HYDROmorphone (DILAUDID) injection 1 mg (1 mg IntraVENOUS Given 11/21/20 1636)   amoxicillin-clavulanate (AUGMENTIN) 875-125 MG tablet 1 tablet (1 tablet Oral Given 11/21/20 1710)   ipratropium-albuterol (DUO-NEB) 0.5-3 MG/3ML nebulizer solution 3 mL (3 mL Nebulization Given 11/21/20 1712)   ipratropium-albuterol (DUO-NEB) 0.5-3 MG/3ML nebulizer solution 3 mL (3 mL Nebulization Given 11/21/20 2001)   HYDROmorphone (DILAUDID) injection 1 mg (1 mg IntraVENOUS Given 11/21/20 1940)       MDM:    Kimberly Curtis is a 65 year old female here with shortness of breath consistent with viral illness, exacerbation of her sarcoidosis, but not limited to CHF, ACS, cardiac tamponade/pericardial effusion; pulmonary causes such as asthma/COPD, pneumonia, pulmonary embolism, pneumothorax, hemothorax, foreign body; Anemia; acidosis, sepsis, DKA.  Presenting after developing shortness of breath for  the past week.  Has had associated productive cough.  Was seen at [facility] but did not continue to take the medications as directed.  Has had similar symptoms like this in the past when she is exacerbation of sarcoidosis.  Has no chest pain, hemoptysis.  No increased oxygen demand.  Currently on 3 L at home.  On initial examination patient was chronically ill-appearing, had tachypnea, was on 3 L oxygen satting 98%, chest wall was nontender, no JVD or bilateral extremity edema pair bedside ultrasound showed no B lines, no pericardial effusion, good ejection fraction.  Given these findings orders placed as below.    Orders:  Orders Placed This Encounter   Procedures    X-Ray Chest Single View    CMP    HIV 1/2 Antibody & P24 Antigen Assay    Venous Blood Gas Heparin Syringe    Sodium, Venous Heparin Syringe    Potassium, Venous Heparin Syringe    Calcium, Ionized, Venous Heparin Syringe    Glucose, Venous Heparin Syringe    CBC w/ Diff Lavender    Pro Bnp, Blood Green Plasma Separator Tube    ECG 12 Lead    ECG 12 Lead    ECG 12 Lead       Workup Summary       Value Comment By Time      Blood pressure (BP): 184/91 (Reviewed) Jule Ser, MD 08/31 1525     Temperature: 98.9 F (37.2 C) (Reviewed) Jule Ser, MD 08/31 1525     Heart Rate: 82 (Reviewed) Jule Ser, MD 08/31 1525      Respirations: 28 (Reviewed) Jule Ser, MD 08/31 1525     SpO2: 100 % (Reviewed) Jule Ser, MD 08/31 1525     Blood pressure (BP): 178/69 (Reviewed) Jule Ser, MD 08/31 1601     Temperature: 98.8 F (37.1 C) (Reviewed) Jule Ser, MD 08/31 1601     Heart Rate: 73 (Reviewed) Jule Ser, MD 08/31 1601     Respirations: 24 (Reviewed) Jule Ser, MD 08/31 1601     SpO2: 100 % (Reviewed) Jule Ser, MD 08/31 1601      Venous Blood Gas  Heparin Syringe:    [place] Venous   Temp, Ven 37.1   FIO2, Ven 32.0   pH, Ven (T) 7.38   pCO2,  Ven (T) 59(!)   pO2, Ven (T) 45(!)   HCO3, Ven 31(!)   BE, Ven 7.7(!)   O2 Sat, Ven (Est) 79.7   pH, Ven (Uncorr) 7.38   pCO2, Ven (Uncorr) 59(!)   pO2, Ven (Uncorr) 45(!) No acidosis, hypercapnia but suspect this is her baseline. Jule Ser, MD 08/31 1642     Influenza A/B & SARS-CoV-2 PCR Combo for Rapid Response Lab Nasal-Pharyngeal:    SARS-CoV-2 PCR, RRL Not Detected   Influenza A PCR, RRL Not Detected   Influenza B PCR, RRL Not Detected (Reviewed) Jule Ser, MD 08/31 1748      CMP:    Glucose 237(!)   BUN 15   Creatinine 0.97(!)   eGFR Based on CKD-EPI 2021 Equation >60   GFR >60   Sodium 142   Potassium 4.2   Chloride 103   Bicarbonate 30(!)   Anion Gap 9   Calcium 9.4   Total Protein 6.6   Albumin 4.1   Bilirubin, Tot 0.21   AST (SGOT) 15   ALT (SGPT) 26   Alkaline Phos 138(!) (Reviewed) Jule Ser, MD 08/31 [phone]     X-Ray Chest Single View Mild interstitial pulmonary edema. Jule Ser, MD 08/31 1821     BNPP: 351 (Reviewed) Jule Ser, MD 08/31 1952      Blood pressure (BP): 117/42 (Reviewed) Jule Ser, MD 08/31 2114     Heart Rate: 95 (Reviewed) Jule Ser, MD 08/31 2114     Respirations: 18 (Reviewed) Jule Ser, MD 08/31 2114     SpO2: 99 % (Reviewed) Jule Ser, MD 08/31 2114        Will treat the patient as a exacerbation of her sarcoidosis.  This improved with 2 rounds of DuoNeb and steroids.  She will be discharged with Augmentin which should be stay for the patient's cardiac history.  She will also receive a 5 day course of 40 mg of prednisone.  Was recommended to her that she follow-up with her pulmonologist as soon as possible.  She should follow up with her primary care doctor as well.  Strict return precautions were given to the patient.  Patient understands and agrees the plan.      I have discussed my thought process and plan for the patient with the attending physician, Marjean Donna*.    Catalina Gravel, MD  Department of Emergency Medicine  [facility]         Jule Ser, MD  Resident  11/21/20 2145       Marjean Donna, MD  11/24/20 [phone]

## 2020-11-21 NOTE — Discharge Instructions (Signed)
You were evaluated in the Emergency Department today for shortness of breath. Your symptoms improved with Albuterol and steroids, and your evaluation did not show evidence of medical conditions requiring emergent intervention at this time. You have been given a prescription for steroids, please take them as directed.    Please follow up with your primary care physician within two days.  Follow-up with your pulmonologist    Return to the Emergency Department if you experience worsening shortness of breath, chest pain, headache, light headedness, or any other concerning symptoms.    Thank you for choosing [place] for your care.

## 2020-11-21 NOTE — Progress Notes (Signed)
---------------------(data below generated by Sabrina M Owens, MD)--------------------     Patient Verification & Telemedicine Consent:      I am proceeding with this evaluation at the direct request of the patient.  I have verified this is the correct patient and have obtained verbal consent and written consent from the patient/ surrogate to perform this voluntary telemedicine evaluation (including obtaining history, performing examination and reviewing data provided by the patient).   The patient/ surrogate has the right to refuse this evaluation.  I have explained risks (including potential loss of confidentiality), benefits, alternatives, and the potential need for subsequent face to face care. Patient/ surrogate understands that there is a risk of medical inaccuracies given that our recommendations will be made based on reported data (and we must therefore assume this information is accurate).  Knowing that there is a risk that this information is not reported accurately, and that the telemedicine video, audio, or data feed may be incomplete, the patient agrees to proceed with evaluation and holds us harmless knowing these risks. In this evaluation, we will be providing recommendations only.  The ultimate decision to follow, or not follow, these recommendations will be left to the bedside treating/ requesting practitioner.  The patient/ surrogate has been notified that other healthcare professionals (including students, residents and technical personnel) may be involved in this audio-video evaluation.   All laws concerning confidentiality and patient access to medical records and copies of medical records apply to telemedicine.  The patient/ surrogate has received the [place] Notice of Privacy Practices.  I have reviewed this above verification and consent paragraph with the patient/ surrogate.  If the patient is not capacitated to understand the above, and no surrogate is available, since this is not an emergency  evaluation, the visit will be rescheduled until such time that the patient can consent, or the surrogate is available to consent.    Demographics:   Medical Record #: [id number]   Date: November 21, 2020   Patient Name: Kimberly Curtis   DOB: 05/14/1955  Age: 65 year old  Sex: female  Location: Home address on file      Evaluator(s):   Kimberly Curtis was evaluated by me today.    Clinic Location:  [facility]  [facility]  [address]  [place] [address]       Due to COVID-19 pandemic and a federally declared state of public health emergency, I am proceeding with this video evaluation at the request of the patient. I have verified this the correct patient and have obtained verbal consent from the patient to perform this voluntary evaluation. Pt is self isolating as per county mandate.      I personally spent 20 minutes  in direct management of the patient today excluding separately reportable services/procedures.     Chief Complaint:  Refill Request (Antibiotic )      Subjective:    Kimberly Curtis is a 65 year old female who presents with   Chief Complaint   Patient presents with   • Refill Request     Antibiotic        Patient here for acute visit.    #Medication concern: pt hospitalized at [facility] and placed on antibiotics. Is concerned for tachycardia given her pacemaker. Would like to be placed on a different antibiotics. No records received yet by office. Pt currently on home O2. Pt was placed on doxycycline and cefuroximine. Reports is unable   to take the cefuroximine because she had a history of V tach with it prior. Has not tried the doxycycline because she has had "anxiety" about her V tach history. Was not hospitalized but prescribed medication. Pt coughing up "green stuff." Using her albuterol breathing treatments which is helping. Reports history of issues with levofloxacin.  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     #Anxiety: Pt started on Xanax and was given 15 tablets. Pt reports her HH nurse called the office. Pt having trouble with CPAP machine because she feels like she is "suffocating." Pt feels like it is not the mask.     Reviewed past medical history, surgical, family, medication and social history.   Reviewed outside medical records if available.     FM PHQ2 11/21/2020 08/21/2020 07/09/2020 07/09/2020   PHQ2 Total 5 2 3 4     FM PHQ9 score 11/21/2020 07/09/2020   PHQ9 Patient Summary Score (calculated) 17 12     GAD 7 08/21/2020 11/21/2020   1. Feeling nervous, anxious or on edge 0 0   2. Not being able to stop or control worrying 0 0   3. Worrying too much about different things - 0   4. Trouble relaxing - 0   5. Being so restless that it is hard to sit still - 0   6. Being easily annoyed or irritable - 0   7. Feeling afraid as if something awful might happen - 0   GAD7 Patient Total - 0   If you checked off any problems, how difficult have these problems made it for you to do your job along with other people? - Not difficult at all         Review of Systems   HENT: Negative for sore throat.    Respiratory: Positive for cough and shortness of breath.    Cardiovascular: Negative for chest pain.   Gastrointestinal: Negative for constipation, diarrhea, nausea and vomiting.   Genitourinary: Negative for dysuria.   Skin: Negative for rash.       Current Outpatient Medications on File Prior to Visit   Medication Sig Dispense Refill   • albuterol (PROVENTIL) (2.5 MG/3ML) 0.083% nebulization 3 mL (2.5 mg) by Nebulization route every 4 hours as needed for Wheezing. 1 each 2   • Albuterol Sulfate 108 (90 Base) MCG/ACT AEPB Inhale 1 puff by mouth as needed.     • ALPRAZolam (XANAX) 0.5 MG tablet Take one-half tablet (0.25 mg) by mouth nightly as needed for Insomnia or Anxiety. 14 tablet 0   • ASPIRIN LOW DOSE 81 MG EC tablet TAKE 1 TABLET BY MOUTH EVERY DAY 120 tablet 1   • atorvastatin  (LIPITOR) 40 MG tablet Take 1 tablet (40 mg) by mouth daily. 90 tablet 3   • Continuous Blood Gluc Receiver (CONTINUOUS BLOOD GLUCOSE, DEXCOM G6, RECEIVER) Use daily to read glucose topically 1 each 0   • Continuous Blood Gluc Sensor (CONTINUOUS BLOOD GLUCOSE, DEXCOM G6, SENSOR) Insert subcutaneously every 10 days. 3 each 0   • Continuous Blood Gluc Transmit (CONTINUOUS BLOOD GLUCOSE, DEXCOM G6, TRANSMITTER) Use daily to transmit subcutaneous glucose value. Change every 3 months. 2 each 0   • famotidine (PEPCID) 40 MG tablet TAKE 1 TABLET BY MOUTH EVERY DAY 90 tablet 1   • fexofenadine (ALLEGRA) 180 MG tablet Take 1 tablet (180 mg) by mouth daily. 90 tablet 1   •   fluticasone-umeclidinium-vilanterol (TRELEGY ELLIPTA) 100-62.5-25 MCG/INH AEPB Inhale 1 puff by mouth daily.      folic acid (FOLVITE) 1 MG tablet Take 1 tablet (1 mg) by mouth daily. 90 tablet 1    Hydrocod Polst-CPM Polst ER 10-8 MG/5ML SUER Take 5 mL by mouth every 12 hours as needed (cough). 240 mL 0    HYDROCODONE-CHLORPHENIRAMINE PO 5 mL. Every 12 hours as needed for cough      hydrOXYzine HCL (ATARAX) 50 MG tablet TAKE 1 TABLET BY MOUTH 2 TIMES DAILY AS NEEDED FOR ITCHING 180 tablet 1    insulin glargine (LANTUS SOLOSTAR) 100 units/mL injection pen Inject 48 Units under the skin nightly. 15 mL 2    losartan (COZAAR) 50 MG tablet Take 1 tablet (50 mg) by mouth daily. 90 tablet 1    metoprolol tartrate (LOPRESSOR) 25 MG tablet Take 1 tablet (25 mg) by mouth 2 times daily. 180 tablet 1    montelukast (SINGULAIR) 10 MG tablet Take 1 tablet (10 mg) by mouth every evening. 90 tablet 1    naloxone (NARCAN) 4 mg/0.1 mL nasal spray Spray 1 spray into one nostril once as needed.      NOVOLOG FLEXPEN 100 UNIT/ML injection pen INJECT 20 UNITS UNDER THE SKIN 3 TIMES DAILY AS NEEDED FOR HIGH BLOOD SUGAR. AM: 20 UNITS + SLIDING SCALE MID DAY: 17 + SLIDING SCALE AND PM 12 + SLIDING SCALE 15 mL 1    ondansetron (ZOFRAN ODT) 8 MG disintegrating tablet TAKE  1 TABLET (8 MG) ON OR UNDER THE TONGUE EVERY 8 HOURS AS NEEDED FOR NAUSEA/VOMITING. 30 tablet 0    oxycodone-acetaminophen (PERCOCET) 10-325 MG tablet Take 1 tablet by mouth every 8 hours as needed for Severe Pain (Pain Score 7-10). 90 tablet 0    pantoprazole (PROTONIX) 40 MG tablet Take 1 tablet (40 mg) by mouth every morning (before breakfast). 90 tablet 1    predniSONE (DELTASONE) 10 MG tablet Take 1 tablet (10 mg) by mouth daily. 90 tablet 1    pregabalin (LYRICA) 75 MG capsule Take 1 capsule (75 mg) by mouth 2 times daily. 1 tablet AM and 2 tablets PM 60 capsule 2    TECHLITE PEN NEEDLES 32G X 4 MM MISC USE ONE PEN NEEDLE WITH EACH INSULIN ADMINISTRATION. 200 each 11     No current facility-administered medications on file prior to visit.       Allergies   Allergen Reactions    Sulfa Drugs Anaphylaxis    Morphine Itching       Patient Active Problem List   Diagnosis    Type 2 diabetes mellitus without complication, with long-term current use of insulin (CMS-HCC)    Sarcoidosis    Chronic pain syndrome    Recurrent falls    Chronic pain of both knees    Gastroesophageal reflux disease, unspecified whether esophagitis present    Nausea    Essential hypertension    Itching    Mixed hyperlipidemia    Anxiety    Allergy, initial encounter    S/P placement of cardiac pacemaker    Vitamin D deficiency    Fibromyalgia       Past Medical History:   Diagnosis Date    Diabetes mellitus (CMS-HCC)     Hypertension     Sarcoidosis of lung (CMS-HCC)     bilat       Past Surgical History:   Procedure Laterality Date    CHOLECYSTECTOMY      HYSTERECTOMY  KNEE ARTHROSCOPY Left        Social History     Socioeconomic History    Marital status: Married     Spouse name: Not on file    Number of children: Not on file    Years of education: Not on file    Highest education level: Not on file   Occupational History    Not on file   Tobacco Use    Smoking status: Former Smoker    Smokeless  tobacco: Never Used   Substance and Sexual Activity    Alcohol use: Not Currently    Drug use: Never    Sexual activity: Not on file   Other Topics Concern    Not on file   Social History Narrative    Not on file     Social Determinants of Health     Financial Resource Strain: Not on file   Food Insecurity: Not on file   Transportation Needs: Not on file   Physical Activity: Not on file   Stress: Not on file   Social Connections: Not on file   Intimate Partner Violence: Not on file   Housing Stability: Not on file       Family Status   Relation Status    Mo Deceased    Fa Deceased       Objective:  There were no vitals filed for this visit.  Estimated body mass index is 40.25 kg/m as calculated from the following:    Height as of 08/27/20: 5' 1" (1.549 m).    Weight as of 09/18/20: 96.6 kg (213 lb).    Physical Exam  General: In NAD, A&O, pleasant. Appears fatigued.  HEENT: NC/AT. Anicteric. Non-injected.   Lungs: Normal speech. Hacking productive cough on exam. NC in place.   Psychiatric: normal affect, good insight and judgment, memory intact.     Lab:   Results for orders placed or performed in visit on 10/30/20   Lipid Panel Green Plasma Separator Tube   Result Value Ref Range    Cholesterol 217 <200 mg/dL    HDL-Cholesterol 72 mg/dL    LDL-Chol (Calc) 119 <160 mg/dL    Non-HDL Cholesterol 145 mg/dL    Triglycerides 129 10 - 170 mg/dL   Vitamin D, 25-OH Total Yellow serum separator tube   Result Value Ref Range    Vitamin D, 25-Hydroxy 76 30 - 80 ng/mL   Comprehensive Metabolic Panel (CMP)   Result Value Ref Range    Glucose 71 70 - 99 mg/dL    BUN 21 8 - 23 mg/dL    Creatinine 0.90 0.51 - 0.95 mg/dL    eGFR Based on CKD-EPI 2021 Equation >60 mL/min    GFR >60 mL/min    Sodium 141 136 - 145 mmol/L    Potassium 4.5 3.5 - 5.1 mmol/L    Chloride 102 98 - 107 mmol/L    Bicarbonate 32 (H) 22 - 29 mmol/L    Anion Gap 7 7 - 15 mmol/L    Calcium 10.1 8.5 - 10.6 mg/dL    Total Protein 7.1 6.0 - 8.0 g/dL    Albumin  4.6 3.5 - 5.2 g/dL    Bilirubin, Tot 0.77 <1.2 mg/dL    AST (SGOT) 22 0 - 32 U/L    ALT (SGPT) 24 0 - 33 U/L    Alkaline Phos 110 40 - 130 U/L   CBC w/ Diff Lavender   Result Value Ref Range  WBC 9.2 4.0 - 10.0 1000/mm3    RBC 4.61 3.90 - 5.20 mill/mm3    Hgb 11.4 11.2 - 15.7 gm/dL    Hct 39.4 34.0 - 45.0 %    MCV 85.5 79.0 - 95.0 um3    MCH 24.7 (L) 26.0 - 32.0 pgm    MCHC 28.9 (L) 32.0 - 36.0 g/dL    RDW 18.5 (H) 12.0 - 14.0 %    MPV 11.3 9.4 - 12.4 fL    Plt Count 213 140 - 370 1000/mm3    Segs 81 %    Imm Gran % 1 (H) <1 %    Lymphocytes 13 %    Monocytes 5 %    Eosinophils 0 %    Basophils 0 %    ANC-Automated 7.4 (H) 1.6 - 7.0 1000/mm3    Imm Gran Abs 0.1 (H) <0.1 1000/mm3    Abs Lymphs 1.2 0.8 - 3.1 1000/mm3    Abs Monos 0.4 0.2 - 0.8 1000/mm3    Abs Eosinophils 0.0 0.0 - 0.5 1000/mm3    Abs Basophils 0.0 <0.2 1000/mm3    Diff Type Automated    Glycosylated Hgb(A1C), Blood Lavender   Result Value Ref Range    Glyco Hgb (A1C) 7.3 (H) 4.8 - 5.8 %       Health Maintenance   Topic Date Due   • Breast Cancer Screen  Never done   • Diabetic Retinal Exam  Never done   • Pneumococcal Vaccine (1 - PCV) Never done   • Medicare Annual Wellness Visit  Never done   • Influenza (1) Never done   • Shingles Vaccine (1 of 2) 07/09/2021 (Originally 09/20/2007)   • COVID-19 Vaccine (1) 07/09/2021 (Originally 03/21/1956)   • PHQ9 Depression Monitoring doc flowsheet  03/23/2021   • Diabetes A1c  05/02/2021   • Diabetes Urine Microalbumin  07/09/2021   • Diabetic Foot Exam  09/18/2021   • LDL Monitoring  10/30/2021   • Diabetes CMP  10/30/2021   • Colorectal Cancer Screening  08/23/2027   • Tetanus (2 - Td or Tdap) 07/10/2030   • Hepatitis C Screening  Completed   • Polio Vaccine  Aged Out   • HPV Vaccine <= 26 Yrs  Aged Out   • Meningococcal MCV4 Vaccine  Aged Out         Ernestina was seen today for refill request.    Diagnoses and all orders for this visit:    Shortness of breath  Comments:  +productive hacking cough on exam.  referral to ER for further evaluation for PNA and IV Abx. Pt on home O2, no increase in need but unable to use CPAP.    Anxiety  Comments:  Xanax refilled on 11/16/2020, pt to notify if unable to pick up. Pt w/ anxiety in relation to health and cardiac issues. Scheduled for psychiatry. CTM.    Screened negative for alcohol use  Comments:  Reviewed.     Screened negative for drug use  Comments:  Reviewed.         Medication Review:  Medications reviewed with patient and medication list reconciled.  Over the counter medications, herbal therapies and supplements reviewed.  Patient's understanding and response to medications assessed.   Barriers to medications assessed and addressed.   Risks, benefits, alternatives to medications reviewed.    Patient Instruction: See Patient Education Section    No barriers to learning, verbalizes understanding of teaching and instructions.    Sabrina   Ricard Dillon MD MPH  [place]

## 2020-11-21 NOTE — Telephone Encounter (Signed)
Patient is scheduled for [place] with Dr. Ricard Dillon 11/22/20.

## 2020-11-21 NOTE — ED Notes (Signed)
Past Medical History:   Diagnosis Date   . Diabetes mellitus (CMS-HCC)    . Hypertension    . Sarcoidosis of lung (CMS-HCC)     bilat     Chief Complaint   Patient presents with   . Difficulty Breathing     Ambulatory into ED with c/o increased difficulty breathing, productive cough (green/yellow sputum) x4 days despite compliance w/nebulizer & inhaler.      Pt is 65 yr Female in ED for above complaint, dyspnea on exertion, 3L NC at baseline and currently. O2 sat 100%. Pt reports chest pain bilaterally, described as tightness in lungs.  Pt is GCS 15, speaking clear and complete sentences, following commands.  Respirations even and unlabored. Clear to auscultation.  Pt denies n/v/d. NAD noted at this time.  Bed is in lowest position, side rails up, nonslip footwear on, call light at bedside.   Will continue to monitor.

## 2020-11-22 ENCOUNTER — Other Ambulatory Visit: Payer: Self-pay

## 2020-11-22 ENCOUNTER — Encounter (INDEPENDENT_AMBULATORY_CARE_PROVIDER_SITE_OTHER): Payer: Medicare Other | Admitting: Family Practice

## 2020-11-22 ENCOUNTER — Encounter (INDEPENDENT_AMBULATORY_CARE_PROVIDER_SITE_OTHER): Payer: Self-pay | Admitting: Family Practice

## 2020-11-22 ENCOUNTER — Other Ambulatory Visit (INDEPENDENT_AMBULATORY_CARE_PROVIDER_SITE_OTHER): Payer: Self-pay | Admitting: Student in an Organized Health Care Education/Training Program

## 2020-11-22 DIAGNOSIS — L299 Pruritus, unspecified: Secondary | ICD-10-CM

## 2020-11-22 DIAGNOSIS — F419 Anxiety disorder, unspecified: Secondary | ICD-10-CM

## 2020-11-22 LAB — UPPER RESPIRATORY PATHOGEN NUCLEIC ACID DETECTION TEST
(NON-COVID-19) Coronavirus PCR: NOT DETECTED
Adenovirus PCR: NOT DETECTED
Chlamydophila pneumoniae PCR: NOT DETECTED
Human Metapneumovirus PCR: NOT DETECTED
Human Rhinovirus/Enterovirus PCR: NOT DETECTED
Influenza A H1 PCR: NOT DETECTED
Influenza A H1-2009 PCR: NOT DETECTED
Influenza A H3 PCR: NOT DETECTED
Influenza A PCR: NOT DETECTED
Influenza B PCR: NOT DETECTED
Mycoplasma pneumoniae PCR: NOT DETECTED
Parainfluenza 1 PCR: NOT DETECTED
Parainfluenza 2 PCR: NOT DETECTED
Parainfluenza 3 PCR: NOT DETECTED
Parainfluenza 4 PCR: NOT DETECTED
Respiratory Syncytial Virus A PCR: NOT DETECTED
Respiratory Syncytial Virus B PCR: NOT DETECTED

## 2020-11-22 LAB — ECG 12-LEAD
ATRIAL RATE: 74 {beats}/min
ECG INTERPRETATION: NORMAL
P AXIS: 60 degrees
PR INTERVAL: 156 ms
QRS INTERVAL/DURATION: 102 ms
QT: 440 ms
QTc (Bazett): 488 ms
R AXIS: -25 degrees
T AXIS: 64 degrees
VENTRICULAR RATE: 74 {beats}/min

## 2020-11-22 MED ORDER — ALPRAZOLAM 0.5 MG OR TABS
0.2500 mg | ORAL_TABLET | Freq: Every evening | ORAL | 0 refills | Status: DC | PRN
Start: 2020-11-22 — End: 2020-11-23
  Filled 2020-11-22: qty 14, 28d supply, fill #0

## 2020-11-22 NOTE — Telephone Encounter (Signed)
Patient seen by Dr. Ricard Dillon on 11/21/20 and note states that she was going to refill the xanax. Patient states no prescription was received by the pharmacy. I reviewed [facility], last filled by PCP on 8/12. Prescription sent.

## 2020-11-22 NOTE — Telephone Encounter (Signed)
From: Rickesha Erichsen  To: [name], MD  Sent: 11/21/2020 5:02 PM PDT  Subject: Xanax     If Dr Jeannette How wrote the refill he did not send it in so could you please send in a a refill for the Xanax for me please, by the way this is Kimberly Curtis. I'm still at the emergency room right now trying to get everything done Thank you

## 2020-11-23 ENCOUNTER — Encounter (INDEPENDENT_AMBULATORY_CARE_PROVIDER_SITE_OTHER): Payer: Self-pay | Admitting: Family Practice

## 2020-11-23 ENCOUNTER — Other Ambulatory Visit: Payer: Self-pay

## 2020-11-23 ENCOUNTER — Telehealth (INDEPENDENT_AMBULATORY_CARE_PROVIDER_SITE_OTHER): Payer: Self-pay | Admitting: Student in an Organized Health Care Education/Training Program

## 2020-11-23 MED ORDER — ALPRAZOLAM 0.5 MG OR TABS
0.2500 mg | ORAL_TABLET | Freq: Every evening | ORAL | 0 refills | Status: DC | PRN
Start: 2020-11-23 — End: 2021-01-14

## 2020-11-23 NOTE — Telephone Encounter (Signed)
From: Shaniece Whitter  To: [name], MD  Sent: 11/22/2020 4:25 PM PDT  Subject: Xanax for Francina Ames     The pharmacy said they never got the refill for for the Xanax did you send [facility] to [facility] on [address] this is where I get [facility] medication from and they said they never received it so just trying to find out where you sent it to please make sure you [facility] on [address] please thank you so much

## 2020-11-23 NOTE — Telephone Encounter (Signed)
Rx sent 08/21/20 #180 + 1rf        Per dispense hx:

## 2020-11-23 NOTE — Telephone Encounter (Signed)
Received documents from [facility]. Forms placed in Dr.Owens in box to sign for Dr.Choudhari. Once MD reviews and signs, records will be faxed to number provided on forms. Once successfully faxed, records will be scanned into Epic and will be available under Media Tab for further viewing.

## 2020-11-23 NOTE — Telephone Encounter (Signed)
Reviewed. Prescription sent to requested pharmacy. Mychart notification sent to patient.

## 2020-11-23 NOTE — Telephone Encounter (Signed)
Established with: Kimberly Curtis   Last OV with PCP: 10/12/2020   Next OV with PCP: 12/25/2020     Requested Medication(s):  Requested Prescriptions     Pending Prescriptions Disp Refills   . hydrOXYzine HCL (ATARAX) 50 MG tablet [Pharmacy Med Name: HYDROXYZINE HCL 50 MG TABLET] 180 tablet 1     Sig: TAKE 1 TABLET BY MOUTH 2 TIMES DAILY AS NEEDED FOR ITCHING       Send to:   [id number] [place] [id number] Phone: [phone]: [phone]      Last labs:   Lab Results   Component Value Date    CHOL 217 10/30/2020    HDL 72 10/30/2020    LDLCALC 119 10/30/2020    TRIG 129 10/30/2020    TSH 1.29 07/09/2020    A1C 7.3 (H) 10/30/2020        Blood Pressure   11/21/20 124/66   10/12/20 150/62   09/18/20 123/88        Health Maintenance Due   Topic Date Due   . Breast Cancer Screen  Never done   . Diabetic Retinal Exam  Never done   . Pneumococcal Vaccine (1 - PCV) Never done   . Medicare Annual Wellness Visit  Never done   . Influenza (1) Never done

## 2020-11-28 ENCOUNTER — Ambulatory Visit (INDEPENDENT_AMBULATORY_CARE_PROVIDER_SITE_OTHER): Payer: Self-pay | Admitting: Student in an Organized Health Care Education/Training Program

## 2020-11-28 ENCOUNTER — Ambulatory Visit (INDEPENDENT_AMBULATORY_CARE_PROVIDER_SITE_OTHER): Payer: Medicare Other | Admitting: Physician Assistant

## 2020-11-28 ENCOUNTER — Encounter (INDEPENDENT_AMBULATORY_CARE_PROVIDER_SITE_OTHER): Payer: Medicare Other | Admitting: Family Practice

## 2020-11-28 ENCOUNTER — Encounter (INDEPENDENT_AMBULATORY_CARE_PROVIDER_SITE_OTHER): Payer: Self-pay | Admitting: Physician Assistant

## 2020-11-28 VITALS — BP 192/72 | HR 62 | Temp 97.6°F | Resp 22 | Ht 64.0 in | Wt 232.1 lb

## 2020-11-28 DIAGNOSIS — I493 Ventricular premature depolarization: Secondary | ICD-10-CM

## 2020-11-28 DIAGNOSIS — E162 Hypoglycemia, unspecified: Secondary | ICD-10-CM

## 2020-11-28 DIAGNOSIS — I160 Hypertensive urgency: Secondary | ICD-10-CM

## 2020-11-28 DIAGNOSIS — R531 Weakness: Secondary | ICD-10-CM

## 2020-11-28 DIAGNOSIS — I491 Atrial premature depolarization: Secondary | ICD-10-CM

## 2020-11-28 DIAGNOSIS — Z7689 Persons encountering health services in other specified circumstances: Secondary | ICD-10-CM

## 2020-11-28 LAB — GLUCOSE POCT, BLOOD
Glucose (POCT): 46 mg/dL — CL (ref 70–99)
Glucose (POCT): 50 mg/dL — CL (ref 70–99)

## 2020-11-28 NOTE — Progress Notes (Signed)
HPI:    Kimberly Curtis 65 year old F presents with husband  Presents with   Chief Complaint   Patient presents with    Cough     Patient c/o of severe body aches, cough, congestion, x almost two weeks. Patient has been seen in ER twice, given antibiotics, and steroids x five days. Patient finished, however still not feeling well. Presents today on oxygen today at 3 liters, however machine over heated, placed on oxygen here in office.      The pt states that around 7a, she started feeling weak, dizzy and with SOB. Glucose this morning was 95, administered insulin because she was not feeing well. Pt has had multiple negative flu and covid tests.     The pt denies FNVD, CP, hemoptysis, LOC    Past Medical History:   Diagnosis Date    Diabetes mellitus (CMS-HCC)     Hypertension     Sarcoidosis of lung (CMS-HCC)     bilat       Past Surgical History:   Procedure Laterality Date    CHOLECYSTECTOMY      HYSTERECTOMY      KNEE ARTHROSCOPY Left        Allergies   Allergen Reactions    Sulfa Drugs Anaphylaxis    Morphine Itching       Current Outpatient Medications   Medication Instructions    albuterol (PROVENTIL) 2.5 mg, Nebulization, EVERY 4 HOURS PRN    albuterol 108 (90 Base) MCG/ACT inhaler 2 puffs, Inhalation, EVERY 6 HOURS PRN    Albuterol Sulfate 108 (90 Base) MCG/ACT AEPB 1 puff, Inhalation, PRN    ALPRAZolam (XANAX) 0.5 MG tablet Take one-half tablet (0.25 mg) by mouth nightly as needed for Insomnia or Anxiety.    ASPIRIN LOW DOSE 81 MG EC tablet TAKE 1 TABLET BY MOUTH EVERY DAY    atorvastatin (LIPITOR) 40 mg, Oral, DAILY    Continuous Blood Gluc Receiver (CONTINUOUS BLOOD GLUCOSE, DEXCOM G6, RECEIVER) Use daily to read glucose topically    Continuous Blood Gluc Sensor (CONTINUOUS BLOOD GLUCOSE, DEXCOM G6, SENSOR) Insert subcutaneously every 10 days.    Continuous Blood Gluc Transmit (CONTINUOUS BLOOD GLUCOSE, DEXCOM G6, TRANSMITTER) Use daily to transmit subcutaneous glucose value. Change  every 3 months.    famotidine (PEPCID) 40 MG tablet TAKE 1 TABLET BY MOUTH EVERY DAY    fexofenadine (ALLEGRA) 180 mg, Oral, DAILY    fluticasone-umeclidinium-vilanterol (TRELEGY ELLIPTA) 100-62.5-25 MCG/INH AEPB 1 puff, Inhalation, DAILY    folic acid (FOLVITE) 1 mg, Oral, DAILY    Hydrocod Polst-CPM Polst ER 10-8 MG/5ML SUER 5 mL, Oral, EVERY 12 HOURS PRN    HYDROCODONE-CHLORPHENIRAMINE PO 5 mL, Every 12 hours as needed for cough    hydrOXYzine HCL (ATARAX) 50 MG tablet TAKE 1 TABLET BY MOUTH 2 TIMES DAILY AS NEEDED FOR ITCHING    Lantus SoloStar 48 Units, Subcutaneous, NIGHTLY    losartan (COZAAR) 50 mg, Oral, DAILY    metoprolol tartrate (LOPRESSOR) 25 mg, Oral, 2 TIMES DAILY    montelukast (SINGULAIR) 10 mg, Oral, EVERY EVENING    naloxone (NARCAN) 4 mg/0.1 mL nasal spray 1 spray, One Naris, ONCE PRN    NovoLOG FlexPen 20 Units, Subcutaneous, 3 TIMES DAILY PRN, AM: 20 units + sliding scale Mid day: 17 + sliding scale and PM 12 + Sliding scale    ondansetron (ZOFRAN ODT) 8 mg, Oral, EVERY 8 HOURS PRN    oxycodone-acetaminophen (PERCOCET) 10-325 MG tablet 1 tablet, Oral, EVERY  8 HOURS PRN    pantoprazole (PROTONIX) 40 mg, Oral, DAILY BEFORE BREAKFAST    predniSONE (DELTASONE) 10 mg, Oral, DAILY    pregabalin (LYRICA) 75 mg, Oral, 2 TIMES DAILY, 1 tablet AM and 2 tablets PM    TECHLITE PEN NEEDLES 32G X 4 MM MISC USE ONE PEN NEEDLE WITH EACH INSULIN ADMINISTRATION.       Review of Systems  Review of Systems   Constitutional: Negative for chills, fatigue and fever.    HENT: + for congestion, rhinorrhea. Negative sore throat.  Negative for change in ability to taste or smell.  Respiratory: +cough, shortness of breath. Negative for chest tightness and wheezing.    Cardiovascular: Negative for chest pain.   Gastrointestinal: Negative for abdominal pain, diarrhea, nausea and vomiting.   Musculoskeletal: + for myalgias.   Allergic/Immunologic: Negative for immunocompromised state.   Neurological: +  for headaches.      11/28/20  1323 11/28/20  1337   BP: (!) 198/80 (!) 192/72   Pulse: 65 62   Resp: 22    Temp: 97.6 F (36.4 C)    SpO2: 100%        Physical Exam:  General: pale, diaphoretic. On 4 lpm O2 via NC  Head: NCAT  Eyes: Anicteric. No conjunctival injection.  Ears:  External ears without erythema, edema, or otorrhea.   Mouth:  MMM. Oropharynx clear without erythema, lesions, or exudate.  Tonsils symmetrical, uvula is midline.  Swallowing secretions without difficulty.  Nose:  No nasal congestion or rhinorrhea.  Neck:  Supple.  Non-tender. No lymphadenopathy.  Chest: Lungs CTAB, with normal aeration.  Respirations are even and nonlabored.  No cough is noted.  No dyspnea, hypoxia, or tachypnea.  Speaks in full sentences without difficulty.  Heart:  RRR.  Normal S1S2.  Cap refill <2 sec.  Abdominal:  Non-distended  Extremities:  MAE x4   Neuro:  GCS 15. Normal mentation.   Skin: Warm, dry.      Results for orders placed or performed in visit on 11/28/20   Glucose (POCT)   Result Value Ref Range    Glucose (POCT) 46 (LL) 70 - 99 mg/dL   Glucose (POCT)   Result Value Ref Range    Glucose (POCT) 50 (LL) 70 - 99 mg/dL       Assessment and Plan:    65 year old female with hx of HTN, DM, sarcoidosis, pacemaker presents with 1 day of weakness, headache and 2 weeks of SOB and cough. Vitals were remarkable for hypertension. Pt was diaphoretic and in moderate distress. Full neurological exam was unable to be completed 2/2 pt weakness. EKG showed PACs, PVCs, paced rhythm. Blood glucose improved from 46-50 with [place] juice given in clinic. EMS was called for TXP to ER for weakness, hypoglycemia and hypertensive urgency. Turnover given to [facility]medic. Pt and pt husband understood and agreed with the above plans.    Supervising MD, Dr. Doree Fudge    Forrest Moron, [place]

## 2020-11-28 NOTE — Interdisciplinary (Signed)
Name/DOB verified.  Pt roomed, VS, and intake done.   Informed on plan of care and aware of delay; pt verbalized understanding.   Comfort measures offered: yes  Vitals stable: yes  Pain addressed: yes  Pt masked: yes    2 Patient Identifiers verified: Nakeitha Fellner 1955/07/27  POCT performed GLUCOSE  Specimen collected by Pecola Lawless November 28, 2020, 1:53 PM   Patient tolerated well  Visual observation of POCT recorded in EMR for provider review and interpretation.     oxygen placed on patient at 4 liters      ekg performed    Patient sent by EMS to ER.       Patient Discharge/Education   AVS printed/explained to pt: Yes   Learner: Patient   Method: Explanation with highlights and Handout   Treatment education given: Yes   Response: Verbalizes understanding   Stable upon discharge: Yes and directed/walked to exit   Pleased w/ service and informed of delay: Yes no complaints   Water: offered, my name and location provided for any needs.   Parking Validation: 1hr-2hr Parking validation informed. Patient aware to stop by the front desk to validate ticket.

## 2020-11-28 NOTE — Telephone Encounter (Signed)
PROVIDER ACTION REQUESTED: No, FYI only   Action item needed:   None   Appt scheduled: Thank you! Your visit to [facility] at 01:15 PM on Nov 28, 2020 has been confirmed. We've sent you a confirmation message. If you are not a registered [facility] patient, please call [phone] to receive a medical record number before you arrive. If your visit is during weekend or evening hours, you will be registered when you arrive.    Chief Complaint   Cough , congestion not getting better x few weeks   Assessment details:   Was seen in the ER on 8/31 and was placed on steroids and antibiotics, reports she finished them all but still coughing and  Congested. Not a lot of mucus coming out per pt.  No fever and breathing is OK. Negative for covid and flu last week.  Ok drinking fluids. No CP reported.  No appnt in the clinic, advise to be evaluated at [facility], schedule at clockwise.  Strict ER precautions advise and pt understands.  Will route for FYI.    CLINIC/RN/LVN ACTION REQUEST: NO, FYI Only  Action item needed: NO, FYI Only    Pt advised to seek Express Care, Time secured at [place] [facility] and pt given strict ED precautions and reviewed all applicable Home Care Advice per protocol.     If COVID+, is patient interested in Paxlovid or other COVID therapeutics: negative covid test last week  If COVID+, please advise pt to upload test result.     COVID VACCINATION STATUS: Yes, fully vaccinated  COVID TRIAGE PROTOCOL: NEGATIVE   Symptoms w/in the last 10 days (no F2F appt): fever or chills, cough, sob/diff breathing, fatigue, muscle or body aches, headache, loss of taste or smell, sore throat, congestion or runny nose, nausea and vomiting, diarrhea.   Questions:   1. Looking back to the last 10 days, have you been informed by a public health agency or a healthcare agency or a healthcare system that you have been exposed to [place] (COVID-19)  2. Looking back to the last 10 days, has a  person in your household been diagnosed with Coronavirus (COVID-19) infection       Reason for Call: Cough     Disposition: See PCP Within 24 Hours     Reason for Disposition   [1] Continuous (nonstop) coughing interferes with work or school AND [2] no improvement using cough treatment per protocol    Additional Information   Negative: SEVERE difficulty breathing (e.g., struggling for each breath, speaks in single words)   Negative: [1] Lips or face are bluish now AND [2] persists when not coughing   Negative: Sounds like a life-threatening emergency to the triager   Negative: Chest pain is main symptom   Negative: [1] Dry cough (non-productive;  no sputum or minimal clear sputum) AND [2] < 3 weeks duration   Negative: [1] Wet cough (productive; white-yellow, yellow, green, or rusty colored sputum) AND [2] < 3 weeks duration   Negative: [1] Previous asthma attacks AND [2] this feels like asthma attack   Negative: [1] MODERATE difficulty breathing (e.g., speaks in phrases, SOB even at rest, pulse 100-120) AND [2] still present when not coughing   Negative: Chest pain  (Exception: MILD central chest pain, present only when coughing)   Negative: [1] Increasing difficulty breathing AND [2] always has some difficulty breathing   Negative: Patient sounds very sick or weak to the triager  Negative: [1] MILD difficulty breathing (e.g., minimal/no SOB at rest, SOB with walking, pulse <100) AND [2] still present when not coughing (Exception: no change from usual, chronic shortness of breath)   Negative: [1] Coughed up blood AND [2] > 1 tablespoon (15 ml) (Exception: blood-tinged sputum)   Negative: Fever > 103 F (39.4 C)   Negative: [1] Fever > 101 F (38.3 C) AND [2] age > 60 years   Negative: [1] Fever > 100.0 F (37.8 C) AND [2] bedridden (e.g., nursing home patient, CVA, chronic illness, recovering from surgery)   Negative: [1] Fever > 100.0 F (37.8 C) AND [2] diabetes mellitus or weak immune system  (e.g., HIV positive, cancer chemo, splenectomy, organ transplant, chronic steroids)   Negative: SEVERE coughing spells (e.g., whooping sound after coughing, vomiting after coughing)    Answer Assessment - Initial Assessment Questions  1. ONSET: "When did the cough begin?"        Since few weeks  - was seen in the ER on 8/31  and was placed on steroid and augmentin   2. SEVERITY: "How bad is the cough today?"       Still productive but not a lot   3. SPUTUM: "Describe the color of your sputum" (none, dry cough; clear, white, yellow, green)      None   4. HEMOPTYSIS: "Are you coughing up any blood?" If so ask: "How much?" (flecks, streaks, tablespoons, etc.)      none  5. DIFFICULTY BREATHING: "Are you having difficulty breathing?" If Yes, ask: "How bad is it?" (e.g., mild, moderate, severe)     - MILD: No SOB at rest, mild SOB with walking, speaks normally in sentences, can lay down, no retractions, pulse < 100.     - MODERATE: SOB at rest, SOB with minimal exertion and prefers to sit, cannot lie down flat, speaks in phrases, mild retractions, audible wheezing, pulse 100-120.     - SEVERE: Very SOB at rest, speaks in single words, struggling to breathe, sitting hunched forward, retractions, pulse > 120       breathing is oK   6. FEVER: "Do you have a fever?" If Yes, ask: "What is your temperature, how was it measured, and when did it start?"       None   7. CARDIAC HISTORY: "Do you have any history of heart disease?" (e.g., heart attack, congestive heart failure)        Yes   8. LUNG HISTORY: "Do you have any history of lung disease?"  (e.g., pulmonary embolus, asthma, emphysema)      Yes   9. PE RISK FACTORS: "Do you have a history of blood clots?" (or: recent major surgery, recent prolonged travel, bedridden)       None   10. OTHER SYMPTOMS: "Do you have any other symptoms?" (e.g., runny nose, wheezing, chest pain)        Chest congestion , body aches, back pain   11. PREGNANCY: "Is there any chance you are  pregnant?" "When was your last menstrual period?"        N/a   12. TRAVEL: "Have you traveled out of the country in the last month?" (e.g., travel history, exposures)       None  Ok drinking plenty of fluids. On 3Lpm of home 02. Has not sleeping on her CPAP recently.  No covid exposure reported. Tested covid and flu negative.    Protocols used: COUGH - CHRONIC-A-AH

## 2020-11-28 NOTE — Telephone Encounter (Signed)
Symptom Call         What symptom is the patient experiencing? Pt is experiencing a lot of cough and congestion. Pt is asking for advise.   Is this a new or ongoing symptom? ongoing  When did the symptom(s) begin? Couple of days   Who is reporting the symptoms? Incoming call from patient    Insurance Coverage Verified: yes  Insurance Plan Name:  Medicare AB  Assigned to [place]: yes  Next office visit:  12/18/2020  Did you offer Express Care/Urgent Care: (if met criteria for Same Day/3 Day scheduling but declines to schedule) no    Transferred call to: Cold Symptoms Line (ext [phone])    Best way to contact patient:  [phone]  Alternative communication method:        P CARE NAV TRIAGE POOL [ [id number] ]        *After hours 5PM   a. For sites: GEN, [place], [facility], [facility], [facility] , SM request On-Call Provider page via operator at [phone]  b. For Sites VTC, EBC, CGD, EAS, PHR and VIS request On-Call Provider page via operator at [phone]

## 2020-11-30 NOTE — Telephone Encounter (Signed)
This is being processed by [employer] itself, closing encounter

## 2020-12-03 ENCOUNTER — Telehealth (INDEPENDENT_AMBULATORY_CARE_PROVIDER_SITE_OTHER): Payer: Self-pay | Admitting: Student in an Organized Health Care Education/Training Program

## 2020-12-03 LAB — ECG 12-LEAD
ATRIAL RATE: 62 {beats}/min
P AXIS: 49 degrees
PR INTERVAL: 160 ms
QRS INTERVAL/DURATION: 116 ms
QT: 472 ms
QTc (Bazett): 479 ms
R AXIS: -20 degrees
T AXIS: 64 degrees
VENTRICULAR RATE: 62 {beats}/min

## 2020-12-03 NOTE — Telephone Encounter (Signed)
Who is calling: Ancillary: Julienne from [facility] is calling on behalf of patient  Insurance Coverage Verified: Active- in network  Reason for this call: She will be certified for physical therapy 1 time per wk for 5 wes    Action required by office: No action needed. Routing to clinic as FYI    Duplicate encounter? No previous documentation found on this issue.

## 2020-12-04 ENCOUNTER — Other Ambulatory Visit (INDEPENDENT_AMBULATORY_CARE_PROVIDER_SITE_OTHER): Payer: Self-pay | Admitting: Student in an Organized Health Care Education/Training Program

## 2020-12-04 DIAGNOSIS — L299 Pruritus, unspecified: Secondary | ICD-10-CM

## 2020-12-04 DIAGNOSIS — R110 Nausea: Secondary | ICD-10-CM

## 2020-12-05 MED ORDER — HYDROXYZINE HCL 50 MG OR TABS
50.0000 mg | ORAL_TABLET | Freq: Two times a day (BID) | ORAL | 3 refills | Status: DC | PRN
Start: 2020-12-05 — End: 2022-10-08

## 2020-12-05 NOTE — Telephone Encounter (Addendum)
[facility]     ondansetron is not currently included in the Pharmacy Refill Clinic protocols. Re-routing to the responsible staff for processing.  Thank you    ---------------------------------------------------------------------------  The following meds were approved per protocol:    Medications ordered in this Encounter   Medications    hydrOXYzine HCL (ATARAX) 50 MG tablet     Sig: Take 1 tablet (50 mg) by mouth 2 times daily as needed for Itching.     Dispense:  180 tablet     Refill:  3

## 2020-12-05 NOTE — Telephone Encounter (Signed)
Request for future fills.        Melisa Sharen Counter, CPhT  (Rx Refill and PA Clinic)      Antihistamine Refill Protocol    Last visit in enc specialty: 11/21/2020     Recent Visits in This Encounter Department     Date Provider Department Visit Type Primary Dx    11/21/2020 [name], MD [facility] Shortness of breath    10/12/2020 [name], MD [facility] discharge follow-up    09/18/2020 [name], [facility] [place] Office Visit Nausea    08/21/2020 [name], MD [facility] discharge follow-up    07/09/2020 [name], MD [facility] [place] Office Visit Type 2 diabetes mellitus without complication, with long-term current use of insulin (CMS-HCC)         Population Health Visits  Recent [facility] Visits    None       Next appt in enc specialty: 12/18/2020      Future Appointments 12/05/2020 - 12/04/2025              Date Visit Type Department Provider     12/06/2020  2:20 PM NEW CIM [place] Myriam Forehand, MD    Appointment Notes:     New CIM - INSDx: Fibromyalgia             12/18/2020  2:40 PM RETURN PRIMARY CARE PATIENT [facility] Therisa Doyne, MD    Appointment Notes:     Follow up fo ED             12/25/2020  1:00 PM [place] Choudhari, Larkin Ina, MD    Appointment Notes:     [facility]             01/02/2021 10:30 AM RETURN [facility] [place] [facility] Nicole Kindred Lap-Kay, MD    Appointment Notes:     3 mon x SarcoidosisPt to cmpt: Chest XR             01/10/2021  9:40 AM RETURN PRIMARY EXTEND VIDEO [facility] Therisa Doyne, MD    Appointment Notes:     Fatigue and body aches             01/10/2021 10:30 AM NEW GEN PSY [place] [facility]  Arrive at: [place], 3rd Floor Check in [place], Alvester Chou, MD    Appointment Notes:     lvm for patient to confirm if appt date/time works for pt. Provider out of office 10/6             01/14/2021  1:00 PM [place] [facility] Elmyra Ricks, Larkin Ina, MD    Appointment Notes:     3 month follow up                       DISPENSE HX:          LABS required:  (None)    Monitoring required:  (None)

## 2020-12-06 ENCOUNTER — Encounter (INDEPENDENT_AMBULATORY_CARE_PROVIDER_SITE_OTHER): Payer: Self-pay | Admitting: Student in an Organized Health Care Education/Training Program

## 2020-12-06 ENCOUNTER — Encounter (INDEPENDENT_AMBULATORY_CARE_PROVIDER_SITE_OTHER): Payer: Medicare Other | Admitting: Family Practice

## 2020-12-06 MED ORDER — ONDANSETRON 8 MG OR TBDP
ORAL_TABLET | ORAL | 0 refills | Status: DC
Start: 2020-12-06 — End: 2021-01-28

## 2020-12-06 NOTE — Telephone Encounter (Signed)
Established with: Kimberly Curtis   Last OV with PCP: 10/12/2020   Next OV with PCP: 12/18/2020         Requested Medication(s):  Requested Prescriptions     Pending Prescriptions Disp Refills   . ondansetron (ZOFRAN ODT) 8 MG disintegrating tablet [Pharmacy Med Name: ONDANSETRON ODT 8 MG TABLET] 30 tablet 0     Sig: TAKE 1 TABLET ON OR UNDER THE TONGUE EVERY 8 HOURS AS NEEDED FOR NAUSEA/VOMITING.     Signed Prescriptions Disp Refills   . hydrOXYzine HCL (ATARAX) 50 MG tablet 180 tablet 3     Sig: Take 1 tablet (50 mg) by mouth 2 times daily as needed for Itching.     Authorizing Provider: [name]     Ordering User: SMALL, DANIEL       Send to:     [id number] [place] [id number] Phone: [phone]: [phone]      Last labs:   Lab Results   Component Value Date    CHOL 217 10/30/2020    HDL 72 10/30/2020    LDLCALC 119 10/30/2020    TRIG 129 10/30/2020    TSH 1.29 07/09/2020    A1C 7.3 (H) 10/30/2020        Blood Pressure   11/28/20 (!) 192/72   11/21/20 124/66   10/12/20 150/62        Health Maintenance Due   Topic Date Due   . Breast Cancer Screen  Never done   . Diabetic Retinal Exam  Never done   . Pneumococcal Vaccine (1 - PCV) Never done   . Medicare Annual Wellness Visit  Never done   . Influenza (1) Never done

## 2020-12-10 ENCOUNTER — Telehealth (INDEPENDENT_AMBULATORY_CARE_PROVIDER_SITE_OTHER): Payer: Self-pay | Admitting: Student in an Organized Health Care Education/Training Program

## 2020-12-10 NOTE — Telephone Encounter (Signed)
Who is calling: Ancillary: Sourith from [facility] is calling on behalf of patient  Insurance Coverage Verified: Active- in network  Reason for this call:   Per Sourith will send orders for Dr. Jeannette How to sign.     Action required by office: No action needed. Routing to clinic as FYI    Duplicate encounter? No previous documentation found on this issue.     Best way to contact: [phone]  Alternative:     Inquiry has been read verbatim to this caller. Verbalizes satisfaction and confirms the above is accurate: yes      Has been advised this message will be transmitted to office and can expect a response within the next 24-72 hours.

## 2020-12-11 ENCOUNTER — Telehealth (INDEPENDENT_AMBULATORY_CARE_PROVIDER_SITE_OTHER): Payer: Self-pay | Admitting: Student in an Organized Health Care Education/Training Program

## 2020-12-11 ENCOUNTER — Encounter (INDEPENDENT_AMBULATORY_CARE_PROVIDER_SITE_OTHER): Payer: Self-pay | Admitting: Student in an Organized Health Care Education/Training Program

## 2020-12-11 ENCOUNTER — Other Ambulatory Visit (INDEPENDENT_AMBULATORY_CARE_PROVIDER_SITE_OTHER): Payer: Self-pay | Admitting: Student in an Organized Health Care Education/Training Program

## 2020-12-11 DIAGNOSIS — E1169 Type 2 diabetes mellitus with other specified complication: Secondary | ICD-10-CM

## 2020-12-11 DIAGNOSIS — Z794 Long term (current) use of insulin: Secondary | ICD-10-CM

## 2020-12-11 MED ORDER — DEXCOM G6 TRANSMITTER MISC
4 refills | Status: DC
Start: 2020-12-11 — End: 2021-11-28

## 2020-12-11 MED ORDER — DEXCOM G6 SENSOR MISC
4 refills | Status: DC
Start: 2020-12-11 — End: 2021-11-28

## 2020-12-11 NOTE — Telephone Encounter (Signed)
General Inquiry     Who is calling: Ancillary: Sadie from [employer] is calling on behalf of patient    Reason for this call: Sadie calling to request following changes to Rx's    Continuous Blood Gluc Transmit (CONTINUOUS BLOOD GLUCOSE, DEXCOM G6, TRANSMITTER)   Qty 1 with 4 Refills     Continuous Blood Gluc Sensor (CONTINUOUS BLOOD GLUCOSE, DEXCOM G6, SENSOR)  Qty 10 with 4 Refills    Action required by office: Please process request     Duplicate encounter? No previous documentation found on this issue.     Best way to contact: [phone]  Alternative:     Inquiry has been read verbatim to this caller. Verbalizes satisfaction and confirms the above is accurate: yes    Has been advised this message will be transmitted to office and can expect a response within the next 24-72 hours.    Encounter created by Care Assist MA.  If further action required please route encounter to appropriate in clinic MA/LVN/Resident Pool

## 2020-12-11 NOTE — Telephone Encounter (Signed)
Rx sent 

## 2020-12-12 ENCOUNTER — Other Ambulatory Visit (INDEPENDENT_AMBULATORY_CARE_PROVIDER_SITE_OTHER): Payer: Self-pay | Admitting: Student in an Organized Health Care Education/Training Program

## 2020-12-12 ENCOUNTER — Telehealth (INDEPENDENT_AMBULATORY_CARE_PROVIDER_SITE_OTHER): Payer: Self-pay | Admitting: Pulmonary Medicine

## 2020-12-12 DIAGNOSIS — Z794 Long term (current) use of insulin: Secondary | ICD-10-CM

## 2020-12-12 DIAGNOSIS — E1169 Type 2 diabetes mellitus with other specified complication: Secondary | ICD-10-CM

## 2020-12-12 MED ORDER — DEXCOM G6 RECEIVER DEVI
0 refills | Status: DC
Start: 2020-12-12 — End: 2021-11-28

## 2020-12-12 NOTE — Telephone Encounter (Signed)
Symptom Call        Who is reporting the symptoms? Pt     What are the symptoms:   Coughing, still having a running nose. Sometimes shortest of breath. Chest pain Recently pt when to the express care and was transfer to ED at [facility] may have been 1 or 2 weeks ago. Pt has had a lot of issues with a Pacemaker. Pt is requesting an appt as soon as possible.     Last office visit: 08/27/2020    Pain level 0-10:  7-8    When did the symptoms start: respiratory about 1 month ago.      Where is the pain located:  Chest     Is there any bleeding: yes, nose bleeding     Best way to contact patient: [phone]    Is it OK to leave a voicemail: yes, ok to leave a detailed msg.     Was patient informed of turnaround time: yes     Was patient informed of ED Precautions: yes

## 2020-12-12 NOTE — Telephone Encounter (Signed)
Coverage issue for Dexcom has been placed in queue for processing. Please allow up to 72 business hours for initiation.     If this is an urgent request due to immediate therapy please re-route as high priority.       Thank you!     [facility]   Refill and Prior [place]

## 2020-12-12 NOTE — Telephone Encounter (Signed)
LVMx1

## 2020-12-12 NOTE — Telephone Encounter (Signed)
Kimberly Curtis, CPhT  (Rx Refill and PA Clinic)      Diabetic Supplies Refill Protocol    Last visit in enc specialty: 11/21/2020     Recent Visits in This Encounter Department     Date Provider Department Visit Type Primary Dx    11/21/2020 [name], MD [facility] Shortness of breath    10/12/2020 [name], MD [facility] discharge follow-up    09/18/2020 [name], [facility] [place] Office Visit Nausea    08/21/2020 [name], MD [facility] discharge follow-up    07/09/2020 [name], MD [facility] Visit Type 2 diabetes mellitus without complication, with long-term current use of insulin (CMS-HCC)         Population Health Visits  Recent [facility] Visits    None       Next appt in enc specialty: 12/18/2020      Future Appointments 12/12/2020 - 12/11/2025      Date Visit Type Department Provider     12/18/2020  2:40 PM [place] PATIENT [facility] Therisa Doyne, MD    Appointment Notes:     Follow up fo ED             12/25/2020  1:00 PM [place] Choudhari, Larkin Ina, MD    Appointment Notes:     [facility]             01/02/2021 10:30 AM RETURN [facility] [place] [facility] Nicole Kindred Lap-Kay, MD    Appointment Notes:     3 mon x SarcoidosisPt to cmpt: Chest XR             01/03/2021  4:00 PM NEW CIM [place] [facility] Myriam Forehand, MD    Appointment Notes:     New CIM - INSDx: Fibromyalgia             01/10/2021 10:30 AM NEW GEN PSY [place] [facility]  Arrive at: [place], 3rd Floor Check in [place], Alvester Chou, MD    Appointment Notes:     lvm for patient to confirm if appt date/time works for pt. Provider out of office 10/6             01/14/2021  1:00 PM [place] [facility] Elmyra Ricks, Larkin Ina, MD    Appointment Notes:     3 month follow up                       **No recent notes or labs reviewed per pharmacy dept protocol**    Pt requires insulin?  yes

## 2020-12-13 LAB — ECG 12-LEAD
ATRIAL RATE: 71 {beats}/min
ECG INTERPRETATION: NORMAL
P AXIS: 52 degrees
PR INTERVAL: 142 ms
QRS INTERVAL/DURATION: 86 ms
QT: 446 ms
QTc (Bazett): 484 ms
R AXIS: -7 degrees
T AXIS: 67 degrees
VENTRICULAR RATE: 71 {beats}/min

## 2020-12-13 NOTE — Telephone Encounter (Signed)
Pharmacy requesting Written order form to be filled out by PCP for Medicare part B . routing to office for assistance. No forms provided by pharmacy. Please see below, for Dexcom receiver, Dexcom sensor and Dexcom transmitter.

## 2020-12-13 NOTE — Telephone Encounter (Signed)
LVM x 2    Natelie Ostrosky Z. RN  [facility]

## 2020-12-17 ENCOUNTER — Telehealth (INDEPENDENT_AMBULATORY_CARE_PROVIDER_SITE_OTHER): Payer: Self-pay | Admitting: Student in an Organized Health Care Education/Training Program

## 2020-12-17 NOTE — Telephone Encounter (Signed)
Received documents from [facility]. Forms placed in Dr.Choudhari, Justin in box. Once MD reviews and signs, records will be faxed to number provided on forms. Once successfully faxed, records will be scanned into Epic and will be available under Media Tab for further viewing.

## 2020-12-18 ENCOUNTER — Other Ambulatory Visit: Payer: Self-pay

## 2020-12-18 ENCOUNTER — Encounter (INDEPENDENT_AMBULATORY_CARE_PROVIDER_SITE_OTHER): Payer: Self-pay | Admitting: Student in an Organized Health Care Education/Training Program

## 2020-12-18 ENCOUNTER — Other Ambulatory Visit
Payer: Medicare Other | Attending: Student in an Organized Health Care Education/Training Program | Admitting: Student in an Organized Health Care Education/Training Program

## 2020-12-18 VITALS — BP 124/64 | HR 76 | Temp 98.3°F | Resp 18 | Wt 228.0 lb

## 2020-12-18 DIAGNOSIS — G894 Chronic pain syndrome: Secondary | ICD-10-CM | POA: Insufficient documentation

## 2020-12-18 DIAGNOSIS — L0292 Furuncle, unspecified: Secondary | ICD-10-CM

## 2020-12-18 DIAGNOSIS — Z09 Encounter for follow-up examination after completed treatment for conditions other than malignant neoplasm: Secondary | ICD-10-CM

## 2020-12-18 DIAGNOSIS — Z23 Encounter for immunization: Secondary | ICD-10-CM

## 2020-12-18 LAB — UR DRUGS OF ABUSE SCREEN
Amphetamines Screen: NEGATIVE
Barbiturates Screen: NEGATIVE
Cocaine Screen: NEGATIVE
Methadone Screen: NEGATIVE
Opiates Screen: NEGATIVE
Phencyclidine Screen: NEGATIVE
THC Screen: NEGATIVE
UR Benzodiazepines High Sensitivity Screen: NEGATIVE
UR Fentanyl Screen: NEGATIVE

## 2020-12-18 MED ORDER — PREGABALIN 75 MG OR CAPS
ORAL_CAPSULE | ORAL | 2 refills | Status: DC
Start: 2020-12-18 — End: 2021-04-04

## 2020-12-18 MED ORDER — MUPIROCIN 2 % EX OINT
1.0000 | TOPICAL_OINTMENT | Freq: Two times a day (BID) | CUTANEOUS | 0 refills | Status: DC
Start: 2020-12-18 — End: 2021-02-11

## 2020-12-18 NOTE — Telephone Encounter (Signed)
Patient is in need of a breast cancer screening mammogram, orders placed.

## 2020-12-18 NOTE — Assessment & Plan Note (Signed)
Chronic pain, OA of knees/back, sarcoid pain-on Lyrica 75/150, Percocet 10 (takes 2-3 doses daily - MME 45). Previous meds: Gabapentin, Tramadol,  Baclofen 5 and Amitryptaline (due to qtc). Has Narcan at home, never used. Previous pain specialist in [place]. Pending Pain eval (has not yet scheduled)  -Cont current medication regimen  -Meds reviewed and reconciled in office today  -CURES reviewed  -Urine tox  -Lyrica refilled  -Referral to Pain medicine for optimization

## 2020-12-18 NOTE — Patient Instructions (Addendum)
Your physician has referred you to the [facility] department. Someone is available to answer your call Monday through Friday from 8:00 a.m. until 5:00 p.m. except on [place] holidays. Please disregard if you have already scheduled your appointment.     To schedule your appointment please contact us at:   General GI/Motility: [phone]   IBD: [phone]   Advanced GI: [phone]     ENDOCRINOLOGY  To schedule your appointment, please call the clinic scheduling phone number at:  [phone]      You have been referred to the Department of Ophthalmology  for a clinic consult. Please call to schedule an appointment at your earliest convenience.  Insurance varies from person to person and you may also need an insurance authorization prior to being seen.     Ophthalmology Department scheduling phone numbers:     [place]: [phone]   [place]:   [phone]

## 2020-12-18 NOTE — Progress Notes (Signed)
Internal Medicine Clinic Progress Note       Chief Complaint   Patient presents with    ER F/U     URI     Subjective:      Kimberly Curtis is a 65 year old female here for follow-up.    Presents with Husband [name] today    Here for ED f/u  Went to [facility] 9/7 for weakness. Referred to the ED for sx hypoglycemia and HTN urgency    She went to [facility]  "They didn't do anything for me, gave me 2 IVs"  Got lab work done, given some nasal sprays  Unable to see outside records    Still reports chronic cough, nausea, weakness  Has not scheduled GI/Endo/Optho/Pain medicine appts yet    History:  Sarcoidosisc/b frequent PNA/bronchitis s/p bronchial stent-previously onchronic steroids,tapered offPrednisone 10 mg,Albuterol neb PRN, MDI uses more frequently, montelukast 10 mg daily, Trelegy daily. Had bronchial stents recently taken out on 04/30/2020 in [place]. Last CT scan 04/2020.Following with [facility], pending CT chest.On 3L 02continuous  Chronic coughcausingB/l abd/thoracic pain-intermittent. Previously prescribed Tussionex  T2DMw/peripheralneuropathies, occasional blurry vision-last A1c7.3% (10/2020), UACR UTC (06/2020). On Lantus 48, Novolog 20-17-12TID plusISS,(stopped Jardiance 25 mg daily due to yeast infections, stopped glipizide, metformin cause GI issues). Last eye exam >10 years ago, pending repeat eye exam, Endo/CDE referral (placed 06/2020).Elevated in the setting of chronic steroids, now stopped. Average 225, has episode of low BS 47 (11/28/20) and sent to ED  Hx of VFIB/TACH/prolonged QTC-s/p DC AICD placement 7/16-7/20/22 at [facility], following with Cardiology at [facility]  HTN-on Metoprolol 25 BID, spironolactone 25 daily (stopped Lasix 20 and Losartan per last admission). Checks BP at home, 120/70s   HLD-on Lipitor 40 per  GERD-Protonix 40 mg daily, Pepcid 40 daily. Pending GI f/u (placed 08/2020 - has not scheduled)  Nausea, GI upset, chronic-pending gastric emptying study, stopped Reglan due to  QTC. Takes Zofran PRN  Diverticulosis-colonoscopy done ~2019  Obesity, BMI39  Chronic pain, OA of knees/back, sarcoid pain-on Lyrica 75/150, Percocet 10 (takes 2-3 doses daily - MME 45). Stopped Gabapentin (stopped), Tramadol (stopped),  Baclofen 5 (stopped) and Amitryptaline due to qtc. Has Narcan at home, never used. Seeing pain specialist in [place]. Pending Pain eval  Anxiety-Xanax PRN, pending Psych visit   Allergies-Allegra 180 daily  Itching-Hydroxyzine 50 mg PRN (not using)  S/p hysterectomy-right ovary intact, done in 1996  S/p L-knee surgery-2003, torn meniscus, ACL repair    HCM  MAWV  MAMMO-done >2-3 years ago, normal. 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 Has 4 children. Stopped tobacco in 1996 (10 pack year history). Denies alcohol use.    Assessment and Plan:     Problem List Items Addressed This Visit        Other    Chronic pain syndrome    Current Assessment & Plan     Chronic pain, OA of knees/back, sarcoid pain-on Lyrica 75/150, Percocet 10 (takes 2-3 doses daily - MME 39). Previous meds: Gabapentin, Tramadol,  Baclofen 5 and Amitryptaline (due to qtc). Has Narcan at home, never used. Previous pain specialist in [place]. Pending Pain eval (has not yet scheduled)  -Cont current medication regimen  -Meds reviewed and reconciled in office today  -CURES reviewed  -Urine tox  -Lyrica refilled  -Referral to Pain medicine for optimization         Relevant Medications    pregabalin (LYRICA) 75 MG capsule  Other Relevant Orders    UR Drugs of Abuse Screen      Other Visit Diagnoses     Hospital discharge follow-up    -  Primary    Reviewed and discussed w/ pt today importance of scheduling f/u visits with Endo/Pain/Optho/GI clinics.    Boil        Under left-breast, no e/o infection    Relevant Medications    mupirocin (BACTROBAN) 2 % ointment          Medication Review:  Medications reviewed with patient and medication list reconciled.  Over the counter  medications, herbal therapies and supplements reviewed.  Patient's understanding and response to medications assessed.   Barriers to medications assessed and addressed.   Risks, benefits, alternatives to medications reviewed.  No barriers to learning, verbalizes understanding of teaching and instructions.    Medications at the end of this encounter:  Current Outpatient Medications   Medication Sig Dispense Refill    albuterol (PROVENTIL) (2.5 MG/3ML) 0.083% nebulization 3 mL (2.5 mg) by Nebulization route every 4 hours as needed for Wheezing. 1 each 2    albuterol 108 (90 Base) MCG/ACT inhaler Inhale 2 puffs by mouth every 6 hours as needed for Wheezing. 1 each 0    Albuterol Sulfate 108 (90 Base) MCG/ACT AEPB Inhale 1 puff by mouth as needed.      ALPRAZolam (XANAX) 0.5 MG tablet Take one-half tablet (0.25 mg) by mouth nightly as needed for Insomnia or Anxiety. 14 tablet 0    ASPIRIN LOW DOSE 81 MG EC tablet TAKE 1 TABLET BY MOUTH EVERY DAY 120 tablet 1    atorvastatin (LIPITOR) 40 MG tablet Take 1 tablet (40 mg) by mouth daily. 90 tablet 3    Continuous Blood Gluc Receiver (CONTINUOUS BLOOD GLUCOSE, DEXCOM G6, RECEIVER) Use daily to read glucose topically 1 each 0    Continuous Blood Gluc Sensor (CONTINUOUS BLOOD GLUCOSE, DEXCOM G6, SENSOR) Insert subcutaneously every 10 days. 10 each 4    Continuous Blood Gluc Transmit (CONTINUOUS BLOOD GLUCOSE, DEXCOM G6, TRANSMITTER) Use daily to transmit subcutaneous glucose value. Change every 3 months. 1 each 4    famotidine (PEPCID) 40 MG tablet TAKE 1 TABLET BY MOUTH EVERY DAY 90 tablet 1    fexofenadine (ALLEGRA) 180 MG tablet Take 1 tablet (180 mg) by mouth daily. 90 tablet 1    fluticasone-umeclidinium-vilanterol (TRELEGY ELLIPTA) 100-62.5-25 MCG/INH AEPB Inhale 1 puff by mouth daily.      folic acid (FOLVITE) 1 MG tablet Take 1 tablet (1 mg) by mouth daily. 90 tablet 1    Hydrocod Polst-CPM Polst ER 10-8 MG/5ML SUER Take 5 mL by mouth every 12 hours as  needed (cough). 240 mL 0    HYDROCODONE-CHLORPHENIRAMINE PO 5 mL. Every 12 hours as needed for cough      hydrOXYzine HCL (ATARAX) 50 MG tablet Take 1 tablet (50 mg) by mouth 2 times daily as needed for Itching. 180 tablet 3    insulin glargine (LANTUS SOLOSTAR) 100 units/mL injection pen Inject 48 Units under the skin nightly. 15 mL 2    losartan (COZAAR) 50 MG tablet Take 1 tablet (50 mg) by mouth daily. 90 tablet 1    metoprolol tartrate (LOPRESSOR) 25 MG tablet Take 1 tablet (25 mg) by mouth 2 times daily. 180 tablet 1    montelukast (SINGULAIR) 10 MG tablet Take 1 tablet (10 mg) by mouth every evening. 90 tablet 1    mupirocin (BACTROBAN) 2 % ointment Apply 1  Application topically 2 times daily. Use a small amount as directed 1 each 0    naloxone (NARCAN) 4 mg/0.1 mL nasal spray Spray 1 spray into one nostril once as needed.      NOVOLOG FLEXPEN 100 UNIT/ML injection pen INJECT 20 UNITS UNDER THE SKIN 3 TIMES DAILY AS NEEDED FOR HIGH BLOOD SUGAR. AM: 20 UNITS + SLIDING SCALE MID DAY: 17 + SLIDING SCALE AND PM 12 + SLIDING SCALE 15 mL 1    ondansetron (ZOFRAN ODT) 8 MG disintegrating tablet TAKE 1 TABLET ON OR UNDER THE TONGUE EVERY 8 HOURS AS NEEDED FOR NAUSEA/VOMITING. 30 tablet 0    oxycodone-acetaminophen (PERCOCET) 10-325 MG tablet Take 1 tablet by mouth every 8 hours as needed for Severe Pain (Pain Score 7-10). 90 tablet 0    pantoprazole (PROTONIX) 40 MG tablet Take 1 tablet (40 mg) by mouth every morning (before breakfast). 90 tablet 1    predniSONE (DELTASONE) 10 MG tablet Take 1 tablet (10 mg) by mouth daily. 90 tablet 1    pregabalin (LYRICA) 75 MG capsule Take 1 capsule (75 mg) by mouth every morning AND 2 capsules (150 mg) every evening. 1 tablet AM and 2 tablets PM. 90 capsule 2    TECHLITE PEN NEEDLES 32G X 4 MM MISC USE ONE PEN NEEDLE WITH EACH INSULIN ADMINISTRATION. 200 each 11     No current facility-administered medications for this visit.       Objective:     Vitals:     12/18/20 1355   BP: 124/64   BP Location: Right arm   BP Patient Position: Sitting   BP cuff size: Large   Pulse: 76   Resp: 18   Temp: 98.3 F (36.8 C)   TempSrc: Temporal   SpO2: 99%   Weight: 103.4 kg (228 lb)       PHYSICAL EXAMINATION   GEN - WDWN, NAD  HEENT - AT/NC  NECK - Supple  SKIN -- hyperpigmented macule under left-breast, no e/o discharge or redness      Labs and Imaging:     Lab Results   Component Value Date    WBC 9.3 11/21/2020    RBC 4.13 11/21/2020    HGB 10.2 (L) 11/21/2020    HCT 35.6 11/21/2020    MCV 86.2 11/21/2020    MCHC 28.7 (L) 11/21/2020    RDW 17.0 (H) 11/21/2020    PLT 251 11/21/2020    MPV 11.1 11/21/2020       Lab Results   Component Value Date    BUN 15 11/21/2020    CREAT 0.97 (H) 11/21/2020    CL 103 11/21/2020    NA 142 11/21/2020    K 4.2 11/21/2020    [place] 9.4 11/21/2020    TBILI 0.21 11/21/2020    ALB 4.1 11/21/2020    TP 6.6 11/21/2020    AST 15 11/21/2020    ALK 138 (H) 11/21/2020    BICARB 30 (H) 11/21/2020    ALT 26 11/21/2020    GLU 237 (H) 11/21/2020       X-Ray Chest Single View    Result Date: 11/21/2020  IMPRESSION: Mild interstitial pulmonary edema.         Followup:       Return in about 4 months (around 04/19/2021).    Future Appointments   Date Time Provider [place]   12/18/2020  2:40 PM [name], MD EAS [facility] [place]   12/25/2020  1:00 PM [name],  MD [facility] [place]   01/03/2021  4:00 PM Myriam Forehand, MD [facility]   01/10/2021 10:30 AM Bishop, Alvester Chou, MD NAR [facility]   01/14/2021  1:00 PM [name], MD EAS [facility] [place]   02/06/2021  8:30 AM Eda Keys, MD [id number]               [name], MD  [facility]  Internal Medicine

## 2020-12-19 ENCOUNTER — Telehealth (INDEPENDENT_AMBULATORY_CARE_PROVIDER_SITE_OTHER): Payer: Self-pay | Admitting: Student in an Organized Health Care Education/Training Program

## 2020-12-19 NOTE — Telephone Encounter (Signed)
Who is calling: Ancillary: Sadie  from [facility] [employer]  is calling on behalf of patient  Insurance Coverage Verified: Active- in network    Reason for this call: Sadie is calling in regards to the pts dexcom prescription. She states that they need to verigy qty and refills for sensor and transmitor       Action required by office: Please contact caller      Duplicate encounter? No previous documentation found on this issue.  Inquiry has been read verbatim to this caller. Verbalizes satisfaction and confirms the above is accurate: yes    Caller has been advised this message will be transmitted to office and can expect a response within the next 24-72 hours

## 2020-12-19 NOTE — Telephone Encounter (Signed)
S/w pharmacist and gave rx dated 12/11/20 sig and refills

## 2020-12-21 ENCOUNTER — Encounter: Payer: Self-pay | Admitting: Student in an Organized Health Care Education/Training Program

## 2020-12-21 ENCOUNTER — Encounter: Payer: Self-pay | Admitting: Hospital

## 2020-12-21 LAB — CONFIRM OPIATES, URINE
Conf. 6 Acetylmorphine: NEGATIVE ng/mL (ref 0–9)
Conf. Codeine: NEGATIVE ng/mL (ref 0–99)
Conf. EDDP: NEGATIVE ng/mL (ref 0–99)
Conf. Fentanyl: NEGATIVE ng/mL (ref 0–1)
Conf. Hydrocodone: NEGATIVE ng/mL (ref 0–99)
Conf. Hydromorphone: NEGATIVE ng/mL (ref 0–99)
Conf. Methadone: NEGATIVE ng/mL (ref 0–99)
Conf. Morphine: NEGATIVE ng/mL (ref 0–99)
Conf. Norcodeine: NEGATIVE ng/mL (ref 0–99)
Conf. Norfentanyl: NEGATIVE ng/mL (ref 0–1)
Conf. Norhydrocodone: NEGATIVE ng/mL (ref 0–99)
Conf. Noroxycodone: NEGATIVE ng/mL (ref 0–49)
Conf. Oxycodone: NEGATIVE ng/mL (ref 0–49)
Conf. Oxymorphone: POSITIVE ng/mL — AB (ref 0–49)

## 2020-12-25 ENCOUNTER — Telehealth (INDEPENDENT_AMBULATORY_CARE_PROVIDER_SITE_OTHER): Payer: Self-pay

## 2020-12-25 ENCOUNTER — Ambulatory Visit (INDEPENDENT_AMBULATORY_CARE_PROVIDER_SITE_OTHER): Payer: Medicare Other | Admitting: Student in an Organized Health Care Education/Training Program

## 2020-12-25 DIAGNOSIS — Z1211 Encounter for screening for malignant neoplasm of colon: Secondary | ICD-10-CM

## 2020-12-25 DIAGNOSIS — Z1212 Encounter for screening for malignant neoplasm of rectum: Secondary | ICD-10-CM

## 2020-12-25 NOTE — Telephone Encounter (Signed)
Reviewed allergies listed in banner with the patient: yes  1. Do you have any additional known allergies to over the counter or prescription medications? Yes   If yes, please list additional medication(s) dye codeine on benadryl  and explain reaction(s)itching, rash   2. Are you allergic to latex? Yes   If yes, please explain reaction(s)itchiness     Patient is scheduled for a MAC colonoscopy on 1/23 with Dr. Rupert Stacks. Please pend order for bowel prep and route to provider for review. APC secured 1/10 at 12:30 pm.     Need COVID orders placed no. Patient aware of Covid (Covid guidelines may change once we get closer to the scheduled procedure date) visitor and transportation policy.      Patient's preferred pharmacy:     [id number] [place] [place] Phone: [phone]: [phone]

## 2020-12-25 NOTE — Telephone Encounter (Signed)
Suprep pended and routed for provider review and approval/denial

## 2020-12-26 ENCOUNTER — Telehealth (INDEPENDENT_AMBULATORY_CARE_PROVIDER_SITE_OTHER): Payer: Self-pay | Admitting: Student in an Organized Health Care Education/Training Program

## 2020-12-26 NOTE — Telephone Encounter (Signed)
General Inquiry     Who is calling: Ancillary: Tiffany from [facility] [employer] (O2 supplier) is calling on behalf of patient    Reason for this call:     Tiffany will be faxing request for chart notes, confirmed dx is still the same and continues use of O2. Please assist, thank you!    Action required by office: Please contact caller     Duplicate encounter? No previous documentation found on this issue.     Best way to contact: [phone]   Fax: [phone]    Inquiry has been read verbatim to this caller. Verbalizes satisfaction and confirms the above is accurate: yes    Has been advised this message will be transmitted to office and can expect a response within the next 24-72 hours.    Encounter created by [facility] MA.  If further action required please route encounter to appropriate in clinic MA/LVN/Resident Pool

## 2020-12-26 NOTE — Telephone Encounter (Signed)
Received documents from [facility]. Forms placed in Dr.Choudhari, Justin in box. Once MD reviews and signs, records will be faxed to number provided on forms. Once successfully faxed, records will be scanned into Epic and will be available under Media Tab for further viewing.

## 2020-12-27 ENCOUNTER — Telehealth (INDEPENDENT_AMBULATORY_CARE_PROVIDER_SITE_OTHER): Payer: Self-pay

## 2020-12-27 ENCOUNTER — Encounter (INDEPENDENT_AMBULATORY_CARE_PROVIDER_SITE_OTHER): Payer: Medicare Other | Admitting: Psychiatry

## 2020-12-27 NOTE — Telephone Encounter (Signed)
Call placed to patient in regards to her procedure, no answer/message left on voicemail. When patient calls back, please let her know that her procedure check in time has been updated to 12:30PM and new letter has been sent to [place]. Thank you.

## 2020-12-28 NOTE — Telephone Encounter (Signed)
No forms as of today

## 2020-12-30 MED ORDER — NA SULFATE-K SULFATE-MG SULF 17.5-3.13-1.6 GM/177ML PO SOLN
1.0000 | Freq: Once | ORAL | 0 refills | Status: AC
Start: 2020-12-30 — End: 2020-12-30

## 2021-01-01 NOTE — Telephone Encounter (Signed)
Who is calling: Ancillary: Tiffany from [facility] is calling on behalf of patient  Insurance Coverage Verified: Active- in network  Reason for this call:   Called to check status of request below. Will go ahead and refax it again.   I also confirmed she had the correct fax #.    Action required by office: Please contact caller     Duplicate encounter? No previous documentation found on this issue.     Best way to contact: [phone]  Alternative:     Inquiry has been read verbatim to this caller. Verbalizes satisfaction and confirms the above is accurate: yes      Has been advised this message will be transmitted to office and can expect a response within the next 24-72 hours.

## 2021-01-02 ENCOUNTER — Encounter (INDEPENDENT_AMBULATORY_CARE_PROVIDER_SITE_OTHER): Payer: Self-pay | Admitting: Hospital

## 2021-01-02 ENCOUNTER — Encounter (INDEPENDENT_AMBULATORY_CARE_PROVIDER_SITE_OTHER): Payer: Medicare Other | Admitting: Pulmonary Medicine

## 2021-01-02 ENCOUNTER — Other Ambulatory Visit (INDEPENDENT_AMBULATORY_CARE_PROVIDER_SITE_OTHER): Payer: Self-pay | Admitting: Student in an Organized Health Care Education/Training Program

## 2021-01-02 DIAGNOSIS — I10 Essential (primary) hypertension: Secondary | ICD-10-CM

## 2021-01-03 ENCOUNTER — Encounter (INDEPENDENT_AMBULATORY_CARE_PROVIDER_SITE_OTHER): Payer: Medicare Other | Admitting: Family Practice

## 2021-01-03 MED ORDER — METOPROLOL TARTRATE 50 MG OR TABS
50.0000 mg | ORAL_TABLET | Freq: Two times a day (BID) | ORAL | 1 refills | Status: DC
Start: 2021-01-03 — End: 2021-04-17

## 2021-01-03 NOTE — Telephone Encounter (Signed)
Established with: [name]   Last OV with PCP: 12/18/2020   Next OV with PCP: 01/14/2021         Requested Medication(s):  Requested Prescriptions     Pending Prescriptions Disp Refills   . metoprolol tartrate (LOPRESSOR) 25 MG tablet 180 tablet 1     Sig: Take 1 tablet (25 mg) by mouth 2 times daily.       Send to:     [id number] [place] [place] [address]  Phone: [phone] Fax: [phone]       Last labs:   Lab Results   Component Value Date    CHOL 217 10/30/2020    HDL 72 10/30/2020    LDLCALC 119 10/30/2020    TRIG 129 10/30/2020    TSH 1.29 07/09/2020    A1C 7.3 (H) 10/30/2020        Blood Pressure   12/18/20 124/64   11/28/20 (!) 192/72   11/21/20 124/66        Health Maintenance Due   Topic Date Due   . Breast Cancer Screen  Never done   . Diabetic Retinal Exam  Never done   . Medicare Annual Wellness Visit  Never done

## 2021-01-03 NOTE — Telephone Encounter (Signed)
Who is calling: Ancillary: Tiffany from [facility] is calling on behalf of patient  Insurance Coverage Verified: Active- in network  Reason for this call: Kimberly Curtis is calling to follow up on the request mentioned below. CCagent confirmed the correct fax number and Tiffany re-faxed the request. Please review and contact Tiffany if the request have been received.     Action required by office: Please contact caller     Duplicate encounter? review encounter below      Best way to [place] has been read verbatim to this caller. Verbalizes satisfaction and confirms the above is accurate: yes      Has been advised this message will be transmitted to office and can expect a response within the next 24-72 hours.

## 2021-01-09 ENCOUNTER — Ambulatory Visit (INDEPENDENT_AMBULATORY_CARE_PROVIDER_SITE_OTHER): Payer: Medicare Other | Admitting: Ophthalmology

## 2021-01-10 ENCOUNTER — Other Ambulatory Visit (INDEPENDENT_AMBULATORY_CARE_PROVIDER_SITE_OTHER): Payer: Self-pay | Admitting: Student in an Organized Health Care Education/Training Program

## 2021-01-10 ENCOUNTER — Ambulatory Visit (INDEPENDENT_AMBULATORY_CARE_PROVIDER_SITE_OTHER): Payer: Medicare Other | Admitting: Psychiatry

## 2021-01-10 ENCOUNTER — Encounter (INDEPENDENT_AMBULATORY_CARE_PROVIDER_SITE_OTHER): Payer: Medicare Other | Admitting: Student in an Organized Health Care Education/Training Program

## 2021-01-10 VITALS — BP 156/68 | HR 97 | Resp 19 | Ht 64.0 in | Wt 223.0 lb

## 2021-01-10 DIAGNOSIS — F331 Major depressive disorder, recurrent, moderate: Secondary | ICD-10-CM

## 2021-01-10 DIAGNOSIS — Z794 Long term (current) use of insulin: Secondary | ICD-10-CM

## 2021-01-10 DIAGNOSIS — E119 Type 2 diabetes mellitus without complications: Secondary | ICD-10-CM

## 2021-01-10 MED ORDER — NOVOLOG FLEXPEN 100 UNIT/ML SC SOPN
PEN_INJECTOR | SUBCUTANEOUS | 11 refills | Status: DC
Start: 2021-01-10 — End: 2021-11-28

## 2021-01-10 MED ORDER — SERTRALINE HCL 25 MG OR TABS
25.0000 mg | ORAL_TABLET | Freq: Every morning | ORAL | 1 refills | Status: DC
Start: 2021-01-10 — End: 2021-02-08

## 2021-01-10 MED ORDER — MIRTAZAPINE 15 MG OR TABS
15.0000 mg | ORAL_TABLET | Freq: Every evening | ORAL | 1 refills | Status: DC | PRN
Start: 2021-01-10 — End: 2021-02-08

## 2021-01-10 NOTE — Telephone Encounter (Signed)
A user error has taken place: encounter opened in error, closed for administrative reasons.

## 2021-01-10 NOTE — Telephone Encounter (Signed)
TC Tiffany waited for 35 minutes on hold still have not received form would like clarification on exactly what is needed. Will await another call back and will try again to call her.

## 2021-01-10 NOTE — Telephone Encounter (Signed)
NOVOLOG FLEXPEN is not currently included in the Pharmacy Refill Clinic protocols. Re-routing to the responsible staff for processing.  Thank you

## 2021-01-10 NOTE — Telephone Encounter (Signed)
Established with: [name]   Last OV with PCP: 12/18/2020   Next OV with PCP: 01/14/2021       Requested Medication(s):  Requested Prescriptions     Pending Prescriptions Disp Refills   . NOVOLOG FLEXPEN 100 UNIT/ML injection pen [Pharmacy Med Name: NOVOLOG 100 UNIT/ML FLEXPEN]  1     Sig: INJECT 20 UNITS UNDER THE SKIN 3 TIMES DAILY AS NEEDED FOR HIGH BLOOD SUGAR. IN THE MORNING 20 UNITS + SLIDING SCALE MID DAY: 17 + SLIDING SCALE AND IN THE EVENING 12 + SLIDING SCALE       Send to:     [id number] [place] [place] [address] [address]  Phone: [phone] Fax: [phone]       Last labs:   Lab Results   Component Value Date    CHOL 217 10/30/2020    HDL 72 10/30/2020    LDLCALC 119 10/30/2020    TRIG 129 10/30/2020    TSH 1.29 07/09/2020    A1C 7.3 (H) 10/30/2020        Blood Pressure   12/18/20 124/64   11/28/20 (!) 192/72   11/21/20 124/66        Health Maintenance Due   Topic Date Due   . Breast Cancer Screen  Never done   . Diabetic Retinal Exam  Never done   . Medicare Annual Wellness Visit  Never done

## 2021-01-10 NOTE — Progress Notes (Signed)
Adult Psychiatry Intake Appt    Patient name: Jacqualynn Parco  Date:  01/10/2021  Time: 1030 to [facility]  Patient referred by: [name]    HISTORY     Chief Complaint: Anxiety and insomnia    HPI: Latrece Nitta is a 65 year old female with a history of anxiety and depression     This is a 65 year old female who has quite a number of complex medical problems including sarcoidosis, primarily pulmonary, which she is been struggling with for 30 years, diabetes, arthritis, chronic pain, and then unfortunately in the summer of this year, she developed cardiac arrhythmias including V-tach and had a pacemaker and defibrillator placed on July 9th. Following the placement of the defibrillator, it discharged once requiring a rehospitalization, and then unfortunately, it  discharged 6 more times which required another hospitalization. Since that time, patients anxiety levels have been much higher, particularly at night, and are associated with significant insomnia. She finds that her mind races, some of it is worrying and some of it is just thinking about things. She has difficulty relaxing, and somatically, she does feel some chest tightness and pressure at these times. She was prescribed Xanax, which she is taking 1/4 of a 0.5 milligram tablet from time to time at night, although she does not find it is been particularly helpful for her sleep.     Patient relates also some depressive symptoms including low moods, lack of motivation and interest and in particularly worsening fatigue. No thoughts of suicide or noted.     Patient has experienced on again off again episodes of depression and anxiety since she was 17, when she became pregnant with her 1st child. For the most part, she has dealt with her symptoms on her own, although since July, the level of her symptoms have increased and are now interfering with her day-to-day functioning     Past Psychiatric History:     1) Diagnoses:  Diagnosed with a combination of anxiety and depression on and off since age 14. Last episode sounds like it was about 40 years ago since she had any treatment  2) Suicide attempts: Denied   3) Inpatient Hospitalizations: Denied  4) Outpatient treatment/psychiatrist: None   5) Medication Trials: When she was 17, she said she was given Valium and amitriptyline, but they were too sedating so she discontinued them. About 40 years ago she said she was trialed on Paxil and Prozac which cause side effects so therefore she discontinued those. Recently her pain specialist offered Cymbalta, but she felt it might cause cardiac arrhythmia so therefore she did not want to take it.  6)No history of psychological trauma.    Substance History:   Patient denies any substance abuse history      Past Medical History:  Diagnoses:  Patient Active Problem List   Diagnosis    Type 2 diabetes mellitus without complication, with long-term current use of insulin (CMS-HCC)    Sarcoidosis    Chronic pain syndrome    Recurrent falls    Chronic pain of both knees    Gastroesophageal reflux disease, unspecified whether esophagitis present    Nausea    Essential hypertension    Itching    Mixed hyperlipidemia    Anxiety    Allergy, initial encounter    S/P placement of cardiac pacemaker    Vitamin D deficiency    Fibromyalgia       Surgeries:  Past Surgical History:   Procedure  Laterality Date    CHOLECYSTECTOMY      HYSTERECTOMY      KNEE ARTHROSCOPY Left        Allergies:   Allergies   Allergen Reactions    Sulfa Drugs Anaphylaxis    Benadryl [Diphenhydramine] Rash and Itching     Per 12/25/20 TE: Do you have any additional known allergies to over the counter or prescription medications? Yes   If yes, please list additional medication(s) dye codeine on benadryl  and explain reaction(s)itching, rash     Latex Itching     Per 12/25/20 TE: Are you allergic to latex? Yes   If yes, please explain reaction(s)itchiness     Morphine  Itching       Medications:   Current Outpatient Medications on File Prior to Visit   Medication Sig Dispense Refill    albuterol (PROVENTIL) (2.5 MG/3ML) 0.083% nebulization 3 mL (2.5 mg) by Nebulization route every 4 hours as needed for Wheezing. 1 each 2    albuterol 108 (90 Base) MCG/ACT inhaler Inhale 2 puffs by mouth every 6 hours as needed for Wheezing. 1 each 0    Albuterol Sulfate 108 (90 Base) MCG/ACT AEPB Inhale 1 puff by mouth as needed.      ALPRAZolam (XANAX) 0.5 MG tablet Take one-half tablet (0.25 mg) by mouth nightly as needed for Insomnia or Anxiety. 14 tablet 0    ASPIRIN LOW DOSE 81 MG EC tablet TAKE 1 TABLET BY MOUTH EVERY DAY 120 tablet 1    atorvastatin (LIPITOR) 40 MG tablet Take 1 tablet (40 mg) by mouth daily. 90 tablet 3    Continuous Blood Gluc Receiver (CONTINUOUS BLOOD GLUCOSE, DEXCOM G6, RECEIVER) Use daily to read glucose topically 1 each 0    Continuous Blood Gluc Sensor (CONTINUOUS BLOOD GLUCOSE, DEXCOM G6, SENSOR) Insert subcutaneously every 10 days. 10 each 4    Continuous Blood Gluc Transmit (CONTINUOUS BLOOD GLUCOSE, DEXCOM G6, TRANSMITTER) Use daily to transmit subcutaneous glucose value. Change every 3 months. 1 each 4    famotidine (PEPCID) 40 MG tablet TAKE 1 TABLET BY MOUTH EVERY DAY 90 tablet 1    fexofenadine (ALLEGRA) 180 MG tablet Take 1 tablet (180 mg) by mouth daily. 90 tablet 1    fluticasone-umeclidinium-vilanterol (TRELEGY ELLIPTA) 100-62.5-25 MCG/INH AEPB Inhale 1 puff by mouth daily.      folic acid (FOLVITE) 1 MG tablet Take 1 tablet (1 mg) by mouth daily. 90 tablet 1    Hydrocod Polst-CPM Polst ER 10-8 MG/5ML SUER Take 5 mL by mouth every 12 hours as needed (cough). 240 mL 0    HYDROCODONE-CHLORPHENIRAMINE PO 5 mL. Every 12 hours as needed for cough      hydrOXYzine HCL (ATARAX) 50 MG tablet Take 1 tablet (50 mg) by mouth 2 times daily as needed for Itching. 180 tablet 3    insulin glargine (LANTUS SOLOSTAR) 100 units/mL injection pen Inject 48  Units under the skin nightly. 15 mL 2    losartan (COZAAR) 50 MG tablet Take 1 tablet (50 mg) by mouth daily. 90 tablet 1    metoprolol tartrate (LOPRESSOR) 50 MG tablet Take 1 tablet (50 mg) by mouth 2 times daily. 180 tablet 1    montelukast (SINGULAIR) 10 MG tablet Take 1 tablet (10 mg) by mouth every evening. 90 tablet 1    mupirocin (BACTROBAN) 2 % ointment Apply 1 Application topically 2 times daily. Use a small amount as directed 1 each 0    naloxone (NARCAN) 4  mg/0.1 mL nasal spray Spray 1 spray into one nostril once as needed.      [DISCONTINUED] NOVOLOG FLEXPEN 100 UNIT/ML injection pen INJECT 20 UNITS UNDER THE SKIN 3 TIMES DAILY AS NEEDED FOR HIGH BLOOD SUGAR. AM: 20 UNITS + SLIDING SCALE MID DAY: 17 + SLIDING SCALE AND PM 12 + SLIDING SCALE 15 mL 1    ondansetron (ZOFRAN ODT) 8 MG disintegrating tablet TAKE 1 TABLET ON OR UNDER THE TONGUE EVERY 8 HOURS AS NEEDED FOR NAUSEA/VOMITING. 30 tablet 0    oxycodone-acetaminophen (PERCOCET) 10-325 MG tablet Take 1 tablet by mouth every 8 hours as needed for Severe Pain (Pain Score 7-10). 90 tablet 0    pantoprazole (PROTONIX) 40 MG tablet Take 1 tablet (40 mg) by mouth every morning (before breakfast). 90 tablet 1    predniSONE (DELTASONE) 10 MG tablet Take 1 tablet (10 mg) by mouth daily. 90 tablet 1    pregabalin (LYRICA) 75 MG capsule Take 1 capsule (75 mg) by mouth every morning AND 2 capsules (150 mg) every evening. 1 tablet AM and 2 tablets PM. 90 capsule 2    TECHLITE PEN NEEDLES 32G X 4 MM MISC USE ONE PEN NEEDLE WITH EACH INSULIN ADMINISTRATION. 200 each 11     No current facility-administered medications on file prior to visit.         Social History:  Childhood: From [place]  Occupational: Disabled and unemployed  Relationship: Married, has 4 children  Current Living Situation: Husband      Family Psychiatric and Medical History:   Daughter has anxiety      PSYCHIATRIC SPECIALTY EXAMINATION     Vital Signs:   Blood pressure 156/68, pulse  97, resp. rate 19, height 5\' 4"  (1.626 m), weight 101.2 kg (223 lb).    Psychometric Scales:  [place] PHQ9 DEPRESSION QUESTIONNAIRE 07/09/2020 07/09/2020 08/21/2020 11/21/2020   Interest 3 2 1 3    Depressed 1 1 1 2    Sleep 1 1 -- 3   Energy 3 3 -- 3   Appetite 2 2 -- 3   Failure 0 0 -- 0   Concentration 2 1 -- 3   Movement 2 2 -- 0   Suicide -- 0 -- 0   Summary(Manual) -- -- -- --   Summary(Calculated) -- 12 -- 17   Functional Very difficult Somewhat difficult -- Somewhat difficult      GAD 7 08/21/2020 11/21/2020   1. Feeling nervous, anxious or on edge 0 0   2. Not being able to stop or control worrying 0 0   3. Worrying too much about different things - 0   4. Trouble relaxing - 0   5. Being so restless that it is hard to sit still - 0   6. Being easily annoyed or irritable - 0   7. Feeling afraid as if something awful might happen - 0   GAD7 Patient Total - 0   If you checked off any problems, how difficult have these problems made it for you to do your job along with other people? - Not difficult at all      No flowsheet data found.    Pertinent Studies/Labs:   Labs and EKGs are reviewed    Mental Status Examination:   Appearance: Grooming casual   Behavior: Cooperative, has oxygen concentrate her  Motor/Abnormal Involuntary Movements: No abnormal or involuntary movements  Gait: steady gait  Speech: understandable/clear, coherent/meaningful, normal rate, and normal volume  Mood: Anxious and depressed  Affect: Appropriate to reported moods   Thought Process: Coherent, logical  Associations: goal-directed  Thought Content: No Current Suicidal or Homicidal Ideation  Perceptions: no abnl  Insight/Judgment: Adequate   Orientation: AAO x4 Memory: recent and remote memory grossly intact Attention/Concentration: no gross abnormalities Language: Average based on verbal fluency and interaction   Fund of knowledge and Intellect: Average based on interview and verbal fluency    MEDICAL DECISION MAKING     Assessment:  Markela Wee is a 65 year old female with a history of episodic anxiety and depression who presents to clinic for a psychiatric intake appointment. Since July of this year, with worsening cardiac symptoms on top of her chronic other health conditions, patients level of anxiety, insomnia, and depression have increased to the point they are interfering with her functioning and the quality of her life. Fortunately no suicidal thoughts are reported    Patient Strengths: Seeking treatment voluntarily, Supportive/involved family/friends, History of independent functioning, Accepts need for medication/therapeutic interventions, Intact cognition, Has insight into illness, Adequate financial resources, History of treatment adherence, Stable living arrangements, and Average intelligence     A comprehensive suicide risk assessment was performed and the patient was assessed to be at a low acute risk of self-harm.  Modifiable risk factors include precipitating stressors and severe medical illness.  Non-modifiable risk factors include existing psychiatric diagnoses and older age.  The patient also has protective factors of future life plans, coping skills, responsibility to children, and access to health care.      Diagnostic Impression:   1. Generalized anxiety disorder    2. Major depression, recurrent    3. Insomnia      Plan  Medications: After some discussion, we agreed that a trial on sertraline may be appropriate to target both anxiety and depression. Starting dose will be 25 milligrams in the morning     Regarding insomnia, rather than utilize a benzodiazepine, which is risky given her age and breathing condition, we elected to trial mirtazapine 15 milligrams at bedtime p.r.n. insomnia     Risks, benefits, and side effects of medications are reviewed, and patient agrees to plan  Labs/Tests/Consultation: n  Referrals: n    Additional Planning:  - Provided psychoeducation, supportive therapy and empathic listening today.  -  Discussed suicide and emergency room precautions.  - Discussed risks/benefits/alternatives to medication, patient voiced understanding.  - CURES review on 01/10/21 not indicated.  Other n    Interactive Complexity: N/A    Disposition: Return visit 1 months    Olegario Messier, MD  Staff Psychiatrist  [facility]

## 2021-01-14 ENCOUNTER — Encounter (INDEPENDENT_AMBULATORY_CARE_PROVIDER_SITE_OTHER): Payer: Self-pay | Admitting: Student in an Organized Health Care Education/Training Program

## 2021-01-14 ENCOUNTER — Ambulatory Visit (INDEPENDENT_AMBULATORY_CARE_PROVIDER_SITE_OTHER): Payer: Medicare Other | Admitting: Student in an Organized Health Care Education/Training Program

## 2021-01-14 VITALS — BP 139/63 | HR 69 | Temp 97.8°F | Resp 18 | Wt 225.0 lb

## 2021-01-14 DIAGNOSIS — E119 Type 2 diabetes mellitus without complications: Secondary | ICD-10-CM

## 2021-01-14 DIAGNOSIS — Z794 Long term (current) use of insulin: Secondary | ICD-10-CM

## 2021-01-14 DIAGNOSIS — Z1231 Encounter for screening mammogram for malignant neoplasm of breast: Secondary | ICD-10-CM

## 2021-01-14 DIAGNOSIS — R059 Cough, unspecified: Secondary | ICD-10-CM

## 2021-01-14 DIAGNOSIS — G894 Chronic pain syndrome: Secondary | ICD-10-CM

## 2021-01-14 MED ORDER — HYDROCOD POLST-CPM POLST ER 10-8 MG/5ML PO SUER
5.0000 mL | Freq: Two times a day (BID) | ORAL | 0 refills | Status: DC | PRN
Start: 2021-01-14 — End: 2021-08-02

## 2021-01-14 MED ORDER — OXYCODONE-ACETAMINOPHEN 10-325 MG OR TABS
1.0000 | ORAL_TABLET | Freq: Three times a day (TID) | ORAL | 0 refills | Status: DC | PRN
Start: 2021-01-14 — End: 2021-04-16

## 2021-01-14 MED ORDER — LIDOCAINE 5 % EX PTCH
1.0000 | MEDICATED_PATCH | CUTANEOUS | 3 refills | Status: DC
Start: 2021-01-14 — End: 2021-04-02

## 2021-01-14 NOTE — Assessment & Plan Note (Addendum)
Chronic pain, OA of knees/back, sarcoid pain  On Lyrica 75/150, Percocet 10 PRN, Tylenol regular.   Previous meds: Gabapentin,Tramadol, Baclofen, and Amitryptaline   Has Narcan at home, never used.   Previously seeing pain specialist in [place].   Pending Pain eval at [facility] (referral placed 07/09/2020 - last appt cancelled per patient)  -Voltaren/Lidocaine PRN  -Tylenol PRN  -Cont Lyrica 75/150  -Percocet 10 for SEVERE PAIN, refilled  -Cont current medication regimen, no longer using benzos for anxiety  -Meds reviewed and reconciled in office today  -CURES reviewed   -Advised to schedule with Pain medicine (appt was cancelled per LV)    I ran the patient's name and date-of-birth through the [facility] of [place] website which will list controlled drug prescriptions filled at [facility] in the past 12 months. The information in the patient's CURES report is consistent with their self-report and shows no evidence of controlled substance abuse or doctor shopping.

## 2021-01-14 NOTE — Progress Notes (Signed)
Internal Medicine Clinic Progress Note       Chief Complaint   Patient presents with    Follow Up       Subjective:      Kimberly Curtis is a 65 year old female here for follow-up.    Presents with Husband Kimberly Curtis today    Pain follow-up  Has not scheduled Endo/Pain medicine appts yet     History:  Sarcoidosisc/b frequent PNA/bronchitis s/p bronchial stent-previously onchronic steroids,tapered offPrednisone 10 mg,Albuterol neb PRN, MDI uses more frequently, montelukast 10 mg daily, Trelegy daily. Had bronchial stents recently taken out on 04/30/2020 in [place]. [facility] CT [facility].On 3L 02continuous  Chronic coughcausingB/l abd/thoracic pain-intermittent. Previously prescribed Tussionex  T2DMw/peripheralneuropathies, occasional blurry vision-last A1c7.3% (10/2020), UACR UTC(06/2020). On Lantus 48, Novolog 20-17-12TID plusISS,(stoppedJardiance 25 mg dailydue to yeast infections, stopped glipizide, metformin cause GI issues). Last eye exam >10 years ago, pending repeat eye exam, Endo/CDE referral (placed 06/2020).Elevated in the setting of chronic steroids, now stopped. Average 225, has episode of low BS 47 (11/28/20) and sent to ED  Hx of VFIB/TACH/prolonged QTC-s/p DC AICD placement 7/16-7/20/22 at [facility], following with Cardiology at [facility]  HTN-onMetoprolol 25 BID, Losartan 50 daily. Checks BP at home, 130/70s and exacerbated with underlying pain   HLD-on Lipitor 40 per  GERD-Protonix 40 mg daily, Pepcid 40 daily. Pending GI f/u (placed 08/2020 - has not scheduled)  Nausea, GI upset, chronic-pending gastric emptying study, stopped Reglan due to QTC. Takes Zofran PRN  Diverticulosis-colonoscopy done ~2019  Obesity, BMI38  Chronic pain, OA of knees/back/hands/knees, sarcoid pain-on Lyrica 75/150, Percocet 10 PRN (takes 1x per day, 0.5 extra for breakthrough). StoppedGabapentin,Tramadol, Baclofen, and Amitryptaline due to qtc. Has Narcan at home, never  used. Previously seeing pain specialist in [place]. Pending Pain eval at [place]  Anxiety/MDD/insomnia-following with Psychiatry. Started on Sertraline 25 and mirtazapine 15  Allergies-Allegra 180 daily  Itching-Hydroxyzine 50 mg PRN(not using)  S/p hysterectomy-right ovary intact, done in 1996  S/p L-knee surgery-2003, torn meniscus, ACL repair    HCM  MAWV  MAMMO-done >2-3 years ago, normal. Ordered 4/18  DM-eye (pending)     Social Hx:  Retired, previous [employer] disability. Has 4 children. Stopped tobacco in 1996 (10 pack year history). Denies alcohol use.    Assessment and Plan:     Problem List Items Addressed This Visit        Endocrine    Type 2 diabetes mellitus without complication, with long-term current use of insulin (CMS-HCC)    Current Assessment & Plan     T2DMw/peripheralneuropathies, occasional blurry vision-last A1c7.3% (10/2020), UACR UTC(06/2020). On Lantus 48, Novolog 20-17-12TID plusISS,(stoppedJardiance 25 mg due to yeast infections, stopped glipizide, metformin cause GI issues, Trulicity cause rash/GI issue/chronic nausea and hx of gastroparesis). Last eye exam >10 years ago, pending repeat eye exam, Endo/CDE referral (placed 06/2020).Previously elevated in the setting of chronic steroids, now stopped. Average 130-160, had episode of low BS 47 (11/28/20) and sent to ED  -Discussed importance of BG management  -Continue current regimen  -recheck labs prior to next visit  -Can consider adding Januvia at next visit, denies pancreatitis  -Cont ARB/statin  -ENDO/CDE referral in   -Continue annual eye exams, referral placed  -F/u as planned            Other    Chronic pain syndrome - Primary    Current Assessment & Plan     Chronic pain, OA of knees/back, sarcoid pain  On Lyrica 75/150, Percocet 10 PRN, Tylenol regular.   Previous meds: Gabapentin,Tramadol, Baclofen, and Amitryptaline   Has Narcan at home, never used.   Previously seeing pain specialist in [place].   Pending Pain  eval at [place] (referral placed 07/09/2020 - last appt cancelled per patient)  -Voltaren/Lidocaine PRN  -Tylenol PRN  -Cont Lyrica 75/150  -Percocet 10 for SEVERE PAIN, refilled  -Cont current medication regimen, no longer using benzos for anxiety  -Meds reviewed and reconciled in office today  -CURES reviewed   -Advised to schedule with Pain medicine (appt was cancelled per LV)    I ran the patient's name and date-of-birth through the [facility] Department of [place] website which will list controlled drug prescriptions filled at [facility] in the past 12 months. The information in the patient's CURES report is consistent with their self-report and shows no evidence of controlled substance abuse or doctor shopping.         Relevant Medications    oxycodone-acetaminophen (PERCOCET) 10-325 MG tablet    lidocaine (LIDODERM) 5 % patch   Other Visit Diagnoses     Encounter for screening mammogram for breast cancer        Relevant Orders    Screening Mammogram With Digital Breast Tomosynthesis - Bilateral    Cough        Relevant Medications    Hydrocod Polst-CPM Polst ER 10-8 MG/5ML SUER          HCM  Declined FLU shot    Medication Review:  Medications reviewed with patient and medication list reconciled.  Over the counter medications, herbal therapies and supplements reviewed.  Patient's understanding and response to medications assessed.   Barriers to medications assessed and addressed.   Risks, benefits, alternatives to medications reviewed.  No barriers to learning, verbalizes understanding of teaching and instructions.    Medications at the end of this encounter:  Current Outpatient Medications   Medication Sig Dispense Refill    albuterol (PROVENTIL) (2.5 MG/3ML) 0.083% nebulization 3 mL (2.5 mg) by Nebulization route every 4 hours as needed for Wheezing. 1 each 2    albuterol 108 (90 Base) MCG/ACT inhaler Inhale 2 puffs by mouth every 6 hours as needed for  Wheezing. 1 each 0    Albuterol Sulfate 108 (90 Base) MCG/ACT AEPB Inhale 1 puff by mouth as needed.      ASPIRIN LOW DOSE 81 MG EC tablet TAKE 1 TABLET BY MOUTH EVERY DAY 120 tablet 1    atorvastatin (LIPITOR) 40 MG tablet Take 1 tablet (40 mg) by mouth daily. 90 tablet 3    Continuous Blood Gluc Receiver (CONTINUOUS BLOOD GLUCOSE, DEXCOM G6, RECEIVER) Use daily to read glucose topically 1 each 0    Continuous Blood Gluc Sensor (CONTINUOUS BLOOD GLUCOSE, DEXCOM G6, SENSOR) Insert subcutaneously every 10 days. 10 each 4    Continuous Blood Gluc Transmit (CONTINUOUS BLOOD GLUCOSE, DEXCOM G6, TRANSMITTER) Use daily to transmit subcutaneous glucose value. Change every 3 months. 1 each 4    famotidine (PEPCID) 40 MG tablet TAKE 1 TABLET BY MOUTH EVERY DAY 90 tablet 1    fexofenadine (ALLEGRA) 180 MG tablet Take 1 tablet (180 mg) by mouth daily. 90 tablet 1    fluticasone-umeclidinium-vilanterol (TRELEGY ELLIPTA) 100-62.5-25 MCG/INH AEPB Inhale 1 puff by mouth daily.      folic acid (FOLVITE) 1 MG tablet Take 1 tablet (1 mg) by mouth daily. 90 tablet 1    Hydrocod Polst-CPM Polst ER  10-8 MG/5ML SUER Take 5 mL by mouth every 12 hours as needed (cough). 70 mL 0    hydrOXYzine HCL (ATARAX) 50 MG tablet Take 1 tablet (50 mg) by mouth 2 times daily as needed for Itching. 180 tablet 3    insulin glargine (LANTUS SOLOSTAR) 100 units/mL injection pen Inject 48 Units under the skin nightly. 15 mL 2    lidocaine (LIDODERM) 5 % patch Apply 1 patch topically every 24 hours. Leave patch on for 12 hours, then remove for 12 hours. 30 patch 3    losartan (COZAAR) 50 MG tablet Take 1 tablet (50 mg) by mouth daily. 90 tablet 1    metoprolol tartrate (LOPRESSOR) 50 MG tablet Take 1 tablet (50 mg) by mouth 2 times daily. 180 tablet 1    mirtazapine (REMERON) 15 MG tablet Take 1 tablet (15 mg) by mouth nightly as needed (insomnia). 30 tablet 1    montelukast (SINGULAIR) 10 MG tablet Take 1 tablet (10 mg) by mouth every  evening. 90 tablet 1    mupirocin (BACTROBAN) 2 % ointment Apply 1 Application topically 2 times daily. Use a small amount as directed 1 each 0    naloxone (NARCAN) 4 mg/0.1 mL nasal spray Spray 1 spray into one nostril once as needed.      NOVOLOG FLEXPEN 100 UNIT/ML injection pen INJECT 20 UNITS UNDER THE SKIN 3 TIMES DAILY AS NEEDED FOR HIGH BLOOD SUGAR. IN THE MORNING 20 UNITS + SLIDING SCALE MID DAY: 17 + SLIDING SCALE AND IN THE EVENING 12 + SLIDING SCALE 15 each 11    ondansetron (ZOFRAN ODT) 8 MG disintegrating tablet TAKE 1 TABLET ON OR UNDER THE TONGUE EVERY 8 HOURS AS NEEDED FOR NAUSEA/VOMITING. 30 tablet 0    oxycodone-acetaminophen (PERCOCET) 10-325 MG tablet Take 1 tablet by mouth every 8 hours as needed for Severe Pain (Pain Score 7-10). 90 tablet 0    pantoprazole (PROTONIX) 40 MG tablet Take 1 tablet (40 mg) by mouth every morning (before breakfast). 90 tablet 1    predniSONE (DELTASONE) 10 MG tablet Take 1 tablet (10 mg) by mouth daily. 90 tablet 1    pregabalin (LYRICA) 75 MG capsule Take 1 capsule (75 mg) by mouth every morning AND 2 capsules (150 mg) every evening. 1 tablet AM and 2 tablets PM. 90 capsule 2    sertraline (ZOLOFT) 25 MG tablet Take 1 tablet (25 mg) by mouth every morning. 30 tablet 1    TECHLITE PEN NEEDLES 32G X 4 MM MISC USE ONE PEN NEEDLE WITH EACH INSULIN ADMINISTRATION. 200 each 11     No current facility-administered medications for this visit.       Objective:     Vitals:    01/14/21 1302   BP: 139/63   BP Location: Right arm   BP Patient Position: Sitting   BP cuff size: Large   Pulse: 69   Resp: 18   Temp: 97.8 F (36.6 C)   TempSrc: Temporal   SpO2: 100%   Weight: 102.1 kg (225 lb)       Labs and Imaging:     Lab Results   Component Value Date    WBC 9.3 11/21/2020    RBC 4.13 11/21/2020    HGB 10.2 (L) 11/21/2020    HCT 35.6 11/21/2020    MCV 86.2 11/21/2020    MCHC 28.7 (L) 11/21/2020    RDW 17.0 (H) 11/21/2020    PLT 251 11/21/2020    MPV 11.1 11/21/2020  Lab Results   Component Value Date    BUN 15 11/21/2020    CREAT 0.97 (H) 11/21/2020    CL 103 11/21/2020    NA 142 11/21/2020    K 4.2 11/21/2020    [place] 9.4 11/21/2020    TBILI 0.21 11/21/2020    ALB 4.1 11/21/2020    TP 6.6 11/21/2020    AST 15 11/21/2020    ALK 138 (H) 11/21/2020    BICARB 30 (H) 11/21/2020    ALT 26 11/21/2020    GLU 237 (H) 11/21/2020       X-Ray Chest Single View    Result Date: 11/21/2020  IMPRESSION: Mild interstitial pulmonary edema.         Followup:       Return in about 3 months (around 04/16/2021).    Future Appointments   Date Time Provider [place]   01/14/2021  1:00 PM [name], MD EAS [facility] [place]   02/06/2021  8:30 AM Eda Keys, MD [facility] [phone]   03/27/2021  9:00 AM Sabino Gasser, MD, PhD [facility]   04/01/2021  2:00 PM Bishop, Alvester Chou, MD NAR [facility]   04/02/2021 12:30 PM PROVIDER A [place] ANES [place] [facility]   07/18/2021  3:00 PM Alda Lea Steele Sizer, NP EAS [facility] Therisa Doyne, MD  [place]  Internal Medicine

## 2021-01-14 NOTE — Assessment & Plan Note (Addendum)
T2DMw/peripheralneuropathies, occasional blurry vision-last A1c7.3% (10/2020), UACR UTC(06/2020). On Lantus 48, Novolog 20-17-12TID plusISS,(stoppedJardiance 25 mg due to yeast infections, stopped glipizide, metformin cause GI issues, Trulicity cause rash/GI issue/chronic nausea and hx of gastroparesis). Last eye exam >10 years ago, pending repeat eye exam, Endo/CDE referral (placed 06/2020).Previously elevated in the setting of chronic steroids, now stopped. Average 130-160, had episode of low BS 47 (11/28/20) and sent to ED  -Discussed importance of BG management  -Continue current regimen  -recheck labs prior to next visit  -Can consider adding Januvia at next visit, denies pancreatitis  -Cont ARB/statin  -ENDO/CDE referral in   -Continue annual eye exams, referral placed  -F/u as planned

## 2021-01-14 NOTE — Patient Instructions (Signed)
Diabetes and Diet    There is no one diet for all people with diabetes.  There is, however, a "recipe" for eating healthfully that is similar to recommendations for heart health, cancer prevention and weight management.    To successfully manage diabetes, you need to understand how foods and nutrition affect your body.  Food portions and food choices are important.   Carbohydrates, fat and protein need to be balanced to ensure blood sugar levels stay as stable as possible (This is particularly important for people with Type 1 diabetes.).    The keys to a healthy eating plan are:    · Eat meals and snacks regularly (at planned times).  · Eat about the same amount of food at each meal or snack.  · Choose healthful foods to support a healthy weight and heart.    Put Together a Plan    You need a registered dietitian nutritionist on your team who will work with you to put together an individualized eating plan that takes into account your food preferences, level of physical activity and lifestyle.    Your RDN will work with you and your physician to strike the right balance between your eating plan and any diabetes medications you take.    Plan Healthy Meals    Good health depends on eating a variety of foods that contain the right amounts  of carbohydrates, protein and healthy fats, as well as vitamins, minerals, fiber and water.   If you have diabetes, a healthy daily eating plan includes:    · Starchy foods including breads, cereals, pasta, rice, other whole grains and starchy vegetables such as beans, corn and peas  · Non-starchy vegetables including carrots, green beans and broccoli  · Fruits  · Lean meat, fish, poultry, low-fat cheese and tofu  · Fat-free or low-fat milk and yogurt  · Healthy fats such as plant-based oils and trans-fat-free spreads    The actual amounts of each food group depend on the number of calories you need, which, in turn, depends on your age, gender, size and activity level.  Together with  your RDN, you can develop an eating plan that is best for you.    Meal Plan Options: Food Lists and Carbohydrate Counting    Carbohydrates affect your blood sugar more than protein or fat.  As your eating plan is designed, portioning out foods high in carbohydrates will help control blood sugar levels.    Choose Your Foods: Food Lists for Diabetes    The food lists for diabetes planning uses food groups, like the ones listed abote: starchy foods, vegetables, fruits, meats, dairy and fat.   Within each food list are food choices that contain similar amounts of fat, protein and carbohydrates.   Because foods are divided this way, you are able to substitute one food choice for another within any one group.  For example, bread, cereal, rice and potatoes are all starch choices.  Your eating plan will specify a certain number of starch choices that you can have for a meal or snack.  You may then select any foods within the starch group that stay within the number of choices planned.  for each meal you will likely have food choices from at least three to four food lists.    Carbohydrate Counting    You may need to keep track of the amount of carbohydrates you eat and drink.  Your RDN will determine a specific amount of carbohydrates for each meal   or snack to ensure your blood sugar stays in good control.  Your job is to learn the number of carbohydrates in each food and drink measured in grams or carb choices, then keep to the planned number at each meal and snack.    Carbohydrate counting gives you wiggle room in terms of making food choices since one carb choice equals 15 grams carbohydrate.  However, to ensure you eat healthfully your focus should be on whole grains, fruits, vegetables, beans and low-fat milk.  Sweets should be saved as occasional treats.    If you have been diagnosed with diabetes, seek the expert advice of a registered dietitian nutritionist  to help you manage the disease while ensuring you get the  nutrients your body needs.

## 2021-01-20 NOTE — Progress Notes (Addendum)
Integrative Medicine Initial Consult Note     CC:  Fibromyalgia    SUBJECTIVE:    Kimberly Curtis is a 65 year old female with hx of sarcodosis s/p bronchial stent who presents for above chief complaint    Patient with hx of fibromyalgia and chronic pain.  Symptoms began many years ago.   She will get pain in the knees, hands, shoulder, rib cage and back.  Dx with OA.  She is currently on lyrica 75/10 and percocet 10 PRN.  She tries to reduce the percocet.  She has previousy tried gabapentin, tramadol, baclofen, and amitryptaline. She has been seen by Dr. Juleen China, pain. They have considered trigger points.   They also recommended Rheum evaluation. She has previously seen then in [place].  We do not have these records.  Current pain described as sharp pain throughout her body.  She feels a lot of pain in the fingers.   Some days she is not able to get out bed.  Pain is all over.      Patient has a hx of sarcoidosis s/p bronchial stents.  She was dx with this in 05-27[birth year].  She has been on 3 L O2.  She is followed by Pulm (next visit 02/07/21).  She does have a chronic cough that will cause abdominal and thoracic pain intermittently.  Previously prescribed Tussionex.      Hx of T2DM with peripheral neuropathy.  Last A1C 7.3.  She is on lantus 48, novolog 20-17-12 TID ISS.      She does get nauseated. She will get a burning sensation in the stomach.  She will get reflux.  She is scheduled for gastric emptying study.  CT abd/pelvis normal. Previoulsy on Reglan due to QTC.  She will use zofran PRN.  She is currently on PPI.      Hx of anxiety, MDD and insomnia.  She is followed by Psychiatry.  She currently takes Sertraline 25 and mirtazapine 15.      Husband was present for today's visit.     MSQ: 209    Current symptoms: as described above.      Last felt well: she can't remember      Functional Timeline:  Pre-conception:  Mom had a lot of complications in pregnancy. Born pre-mature by 2-3 months.  , born with heart murmur.   She was in the NICU.  She was BF. Born in [place] as a child.  She had a lot of cold and infections.  She had mumps, measles.  Raised with 8 siblings.  Lived with both parents.  She was [place].  She started having symptoms of pain at age 36yo. She did not have good access to medical care at that time.   Lived in a house.  Siblings were healthy.    Teenager: mother at age 52yo.  Gave birth to second child at age 35yo.  Went to school.  Graduated highschool   05/27/22:  Raising her family.  She had her gallbladder removed.  She had small medical issues at that time.  Her digestion was ok after GB removed.  Living in house with husband.   30's:  Worked various jobs.  Worked in offices in clerical work  Dx with sacrodosis in late 24s.   40-60's:  Pain symptoms have progressed.   Hysterectomy in 28-May-1995.  Mom passed away.  Dad passed away two years later.  Retired  In 2013-disability.  They have  14 grandchildren, 5 great grandchildren.      Toxic Exposure:  -occupational: she thinks she was exposed to asbestos in the past.  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    -fragrances: yes    -cheap metal reaction: yes   -amalgams:yes.  She needs to follow up with dental    -sushi: denies    -alcohol/drug use/caffeine: alcohol: sober for 4 years, drug-denies, tobacco-denies (stopped in 1997), caffeine-coffee (maybe 3-4 cups)        Trauma History and stress:  Physical- denies  Emotional trauma: denies       Lifestyle:  --Diet: (Diet recall):   12am-grits or oatmeal with blueberry's, yogurt    Skips lunch   5 pm:  Chicken, seafood, catfish or salmon, beef on occasion .  Some veggies   Will snack: banana, fruit, limited processed foods, rare crackers.  Occasional take out-[employer] or [employer]co     -Exercise: none      -Sleep: poor.  Will get about 6 hours. Bed 12 am-wakes up late 11am.  Will wake up throughout the night.  Sleeps in a bed.      -BM/digestion:  She will get a lot of diarrhea.  Some days  more constipation.  Will get occasional bloating. Scheduled for colonoscopy  Jan      -menstruation: s/p HSC     -Social: lives with husband in [place].  Retired.      Supplements:  1. Vit D   2. Garlic    3. Probiotic from [employer]        Patient Goals:   1. Reduce pain      Review of Systems:   GEN: no fevers, chills  HEENT: no headaches  PULM: no cough, shortness of breath  CV:  no chest pain, pressure,leg swelling  GI:  no nausea, vomiting, diarrhea   GU: no dysuria, hematuria  NEURO: no weakness, numbness or tingling, problems walking  MSK: no new joint pain, swelling, redness of heat or muscles  SKIN: no rashes, no changing skin lesions  PSYCH: no depression, behavior changes, significant anxiety or sleep disturbance    Patient Active Problem List   Diagnosis    Type 2 diabetes mellitus without complication, with long-term current use of insulin (CMS-HCC)    Sarcoidosis    Chronic pain syndrome    Recurrent falls    Chronic pain of both knees    Gastroesophageal reflux disease, unspecified whether esophagitis present    Nausea    Essential hypertension    Itching    Mixed hyperlipidemia    Anxiety    Allergy, initial encounter    S/P placement of cardiac pacemaker    Vitamin D deficiency    Fibromyalgia     Past Medical History:   Diagnosis Date    Diabetes mellitus (CMS-HCC)     Hypertension     Sarcoidosis of lung (CMS-HCC)     bilat     Past Surgical History:   Procedure Laterality Date    CHOLECYSTECTOMY      HYSTERECTOMY      KNEE ARTHROSCOPY Left        OBJECTIVE:  There were no vitals taken for this visit.    Wt Readings from Last 10 Encounters:   01/14/21 102.1 kg (225 lb)   01/10/21 101.2 kg (223 lb)   12/18/20 103.4 kg (228 lb)   11/28/20 105.3 kg (232 lb 2.3 oz)   11/21/20 105.3 kg (232 lb 2.3  oz)   09/18/20 96.6 kg (213 lb)   08/27/20 107.3 kg (236 lb 8.9 oz)   07/19/20 107.3 kg (236 lb 8 oz)   07/09/20 106.1 kg (234 lb)   05/12/20 120.7 kg (266 lb)        GEN:  WDWN, NAD.  Appears well.   HEENT: nc/at. PERRL, non-injected. Poor dentition and periodontal disease   NECK:  supple, no LAD, masses  CV: S1, S2, regular rate, no m/r/g appreciated.    ABD:  soft, NT, ND. No masses, guarding or rebound. No CVA tenderness  EXT:  no edema, distal pulses normal and equal bilaterally. warm and well perfused.   SKIN:  no rashes or lesions on exposed skin appreciated  NEUR:  Reduced sensation in feet     Tongue dx: pale, quivering, cracked     LABS:  Results for orders placed or performed in visit on 12/18/20   UR Drugs of Abuse Screen   Result Value Ref Range    Amphetamines Screen Negative Negative    Barbiturates Screen Negative Negative    Cocaine Screen Negative Negative    UR Fentanyl Screen Negative Negative    UR Benzodiazepines High Sensitivity Screen Negative Negative    Methadone Screen Negative Negative    Opiates Screen Negative Negative    Oxycodone Screen Pending confirm Negative    Phencyclidine Screen Negative Negative    THC Screen Negative Negative    UR Drug Screen Interpretation See Comment    Confirm Opiates, Urine   Result Value Ref Range       Conf. Morphine Negative 0 - 99 ng/mL       Conf. Oxymorphone Pos, 94 (A) 0 - 49 ng/mL       Conf. Hydromorphone Negative 0 - 99 ng/mL        Conf. Norcodeine Negative 0 - 99 ng/mL       Conf. Codeine Negative 0 - 99 ng/mL        Conf. Noroxycodone Negative 0 - 49 ng/mL       Conf. Oxycodone Negative 0 - 49 ng/mL       Conf. 6 Acetylmorphine Negative 0 - 9 ng/mL       Conf. Norhydrocodone Negative 0 - 99 ng/mL       Conf. Hydrocodone Negative 0 - 99 ng/mL       Conf. Norfentanyl Negative 0 - 1 ng/mL       Conf. Fentanyl Negative 0 - 1 ng/mL       Conf. EDDP Negative 0 - 99 ng/mL       Conf. Methadone Negative 0 - 99 ng/mL       Conf. Opiates Interp. See comment      Lab Results   Component Value Date    CHOL 217 10/30/2020    HDL 72 10/30/2020    LDLCALC 119 10/30/2020    TRIG 129 10/30/2020     Lab Results   Component Value Date    A1C  7.3 (H) 10/30/2020    A1C 8.4 (H) 07/09/2020       Images:  EXAM DESCRIPTION:  CT ABDOMEN AND PELVIS W/O CONTRAST    CLINICAL HISTORY:  Diffuse abdominal pain, nausea vomiting diarrhea    TECHNIQUE:  COVERAGE: Abdomen and pelvis  IV CONTRAST: None  PHASE ACQUIRED: Noncontrast  POSITIVE ORAL CONTRAST GIVEN: No  ADVERSE EVENTS: None  RECONSTRUCTIONS: Axial 3.62m and sagittal/coronal 346m   Up-to-date CT equipment and radiation dose reduction techniques  were employed. CTDIvol: 14.1 mGy. DLP: 720 mGy-cm.    COMPARISON:  None    FINDINGS:  LUNG BASES: Hypoventilatory changes in the lung bases. Mosaic attenuation likely represents air trapping. Calcified granulomas in the lung bases.  LIVER: Hepatic steatosis. Tiny benign lipoma or angiomyolipoma along the surface of the liver in segment two. Scattered calcified granulomas.  BILIARY: Status post cholecystectomy  PANCREAS: Mild atrophy  SPLEEN: Calcified granuloma.  ADRENAL GLANDS:Unremarkable  KIDNEYS: Unremarkable  STOMACH/DUODENUM:Unremarkable  VASCULATURE: Unremarkable  LYMPHATIC: No enlarged lymph nodes  SMALL & LARGE BOWEL:Diverticulosis  BLADDER/PELVIC ORGANS:Status post hysterectomy  BONES/SOFT TISSUES:Unremarkable  OTHER: Fat containing right inguinal hernia    CONCURRENT SUPERVISION:  I have reviewed the images and agree with the resident interpretation.    DOSE STATEMENT:  "[place] CT scanners employ modern techniques for CT dose reduction, including protocol review, automatic exposure control, and iterative reconstruction techniques. These features assure that radiation dose levels in CT are optimized and are consistent with state-of-the-art, low dose CT practice."           Signed by: Westley Hummer 05/13/2020 10:26:44  IMPRESSION:  IMPRESSION:  CT scan of the abdomen and pelvis without IV contrast.    No acute intra-abdominal finding    Note that assessment of solid organs is limited in the absence of intravenous  contrast.        ASSESSMENT & PLAN:  Kimberly Curtis is a 65 year old female with hx of IDDM, chronic pain, fibromyalgia, sarcodosis, obesity, MDD, anxiety, and OSA was seen today for:  Diagnoses and all orders for this visit:    Fibromyalgia    Other chronic pain    Sarcoidosis    Type 2 diabetes mellitus with diabetic autonomic neuropathy, with long-term current use of insulin (CMS-HCC)    OSA (obstructive sleep apnea)    Diarrhea, unspecified type    Recurrent major depressive disorder, remission status unspecified (CMS-HCC)    Anxiety    Insomnia, unspecified type    BMI 38.0-38.9,adult    Poor dentition        Assimilation: (digestion, microbiome, respiration): diarrhea, reflux, abdominal pain , hx of sarcodosis and OSA     Structural Integrity: chronic pain     Communication: (endocrine, neurotransmitter, immune): OA, RA, hx of HSC, sarcodosis, neuropathy, IDDM     Transport: CV and lymph:  Psychologist, forensic in place. On statin therapy     Biotransformation and elimination: (toxicity/detox): possible exposure to asbestos      Energy: (energy and mitochondrial): fatigue, fibro , depression, anxiety, insomnia      Defense and Repair: (Immune, Inflammation, infection):  Inflammation , autoimmune conditions.      #Fibromyalgia/chronic pain/RA?:   -We spent a lot of time on education today.  We discussed how pain is multifactorial, complex, physical and mental. We discussed the concepts of central sensitization    -previously seen by Rheum (no records)   -She is currently on lyrica 75/10 and percocet 10 PRN.  She tries to reduce the percocet.  She has previousy tried gabapentin, tramadol, baclofen, and amitryptaline. She has been seen by Dr. Juleen China, pain.   -diet: recommend core diet (antiinflamatory diet).  Goal is to also support DM 2 and reduce BMI.  Will place consult to integrative nutrition to help implement this.    -exercise: gentle.  Reviewed gentle qigong and tai chi exercises.  Will place consult to [facility].   Encouraged chair exercises  -she is working with pain  specialist.  Also, recommend pain psychologist. Will attempt to place this consult   -education on mind body medicine, meditation, mindfulness.  She does enjoy this.   -will start Omega 3/DHA/EPA   -continue Vit D (keep over 50)   -start Mg Glycinate 250 mg (Glycinate is easier on the gut but will monitor for worsening GI symptoms)    -may consider SamE  -I will place consult to acupuncture.    -agree she would benefit form TPI/TSI.  This provider does offer this service as well, however, informed patient I would be on maternity leave Dec-April.  She does plan to f/u in pain clinic.      #IDDM/ BMI 38:  -IDDM.  Followed by PCP and Endo.  Last A1C 7.3   - Diet as above. Nutrition consult to support   -gentle exercises   -neuropathy:  Home exercises, foot soaks.  Acupuncture with e-stim has been found to be helpful for this. Consult has been placed   -may consider adding ALA   -will do trial of berberine      #Sarcodosis:  -Patient has a hx of sarcoidosis s/p bronchial stents.  She was dx with this in 1990.  She has been on 3 L O2.  She is followed by Pulm (next visit 02/07/21).  She does have a chronic cough that will cause abdominal and thoracic pain intermittently.  Previously prescribed Tussionex.    -followed by Pulm.  Has f/u next week     #OSA/sleep medicine:  -education on sleep hygiene   -recommend f/u with sleep clinic.  She is not using CPAP   -use Mg at night      #Diarrhea/bloating:   -She does get nauseated. She will get a burning sensation in the stomach.  She will get reflux.  She is scheduled for gastric emptying study.  CT abd/pelvis normal. Previoulsy on Reglan due to QTC.  She will use zofran PRN.  She is currently on PPI.    -she does have f/u with GI. Scheduled for colonoscopy in Jan.   -consider H. Pylori testing?  Do not see this was done.  May consider additional stool testing in addition to SIBO testing   -dietary interventions as above    -continue probiotic   -trial of digestive enzymes      #MDD/anxiety:  -working with psychiatrist.    -will place consult to work with pain psychology   -we discussed education about emotional aspects of pain   -education on self care, mind body practices, mindfulness, meditation, breathing exercises      #poor dentition:  -education about how this is related with other systemic co-morbidites and inflammation   -recommend f/u with dental and good dental hygiene   -She currently takes Sertraline 25 and mirtazapine 15.          F/U: I have informed patient that I will be on maternity leave Dec 5 -April.  She will reach out with any questions/concerns until that time and f/u in April.      Pre-visit planning reviewing the last office visit, labs, imaging and care everywhere when applicable was 5 minutes  Intra-visit was 5 minutes and included updating the relevant history, performing a physical exam as appropriate, creating a treatment plan and used shared decision making with the patient.   Post-visit was 5 minutes that encompassed note completion, placing of orders, updating patient instructions and coordination of care.     Total duration of encounter spent in 60 min  Farrel Demark, DO, FAAFP  Board Certified Family Medicine   Board Eligible [place] [facility]   Assistant Professor [employer], Family Med Dept.

## 2021-01-22 ENCOUNTER — Encounter (INDEPENDENT_AMBULATORY_CARE_PROVIDER_SITE_OTHER): Payer: Self-pay | Admitting: Student in an Organized Health Care Education/Training Program

## 2021-01-23 ENCOUNTER — Other Ambulatory Visit: Payer: Self-pay

## 2021-01-28 ENCOUNTER — Other Ambulatory Visit (INDEPENDENT_AMBULATORY_CARE_PROVIDER_SITE_OTHER): Payer: Self-pay | Admitting: Student in an Organized Health Care Education/Training Program

## 2021-01-28 DIAGNOSIS — R110 Nausea: Secondary | ICD-10-CM

## 2021-01-28 MED ORDER — ONDANSETRON 8 MG OR TBDP
ORAL_TABLET | ORAL | 0 refills | Status: DC
Start: 2021-01-28 — End: 2021-02-11

## 2021-01-28 NOTE — Telephone Encounter (Signed)
Established with: [name]   Last OV with PCP: 01/14/2021   Next OV with PCP: 04/16/2021         Requested Medication(s):  Requested Prescriptions     Pending Prescriptions Disp Refills   . ondansetron (ZOFRAN ODT) 8 MG disintegrating tablet [Pharmacy Med Name: ONDANSETRON ODT 8 MG TABLET] 30 tablet 0     Sig: TAKE 1 TABLET ON OR UNDER THE TONGUE EVERY 8 HOURS AS NEEDED FOR NAUSEA/VOMITING.       Send to:     [id number] [place] [place] [facility] [address] [address]  Phone: [phone] Fax: [phone]       Last labs:   Lab Results   Component Value Date    CHOL 217 10/30/2020    HDL 72 10/30/2020    LDLCALC 119 10/30/2020    TRIG 129 10/30/2020    TSH 1.29 07/09/2020    A1C 7.3 (H) 10/30/2020        Blood Pressure   01/14/21 139/63   01/10/21 156/68   12/18/20 124/64        Health Maintenance Due   Topic Date Due   . Breast Cancer Screen  Never done   . Diabetic Retinal Exam  Never done   . Medicare Annual Wellness Visit  Never done

## 2021-01-29 ENCOUNTER — Ambulatory Visit (INDEPENDENT_AMBULATORY_CARE_PROVIDER_SITE_OTHER): Payer: BLUE CROSS/BLUE SHIELD | Admitting: Family Medicine

## 2021-01-29 DIAGNOSIS — F339 Major depressive disorder, recurrent, unspecified: Secondary | ICD-10-CM

## 2021-01-29 DIAGNOSIS — R197 Diarrhea, unspecified: Secondary | ICD-10-CM

## 2021-01-29 DIAGNOSIS — K089 Disorder of teeth and supporting structures, unspecified: Secondary | ICD-10-CM

## 2021-01-29 DIAGNOSIS — Z794 Long term (current) use of insulin: Secondary | ICD-10-CM

## 2021-01-29 DIAGNOSIS — G8929 Other chronic pain: Secondary | ICD-10-CM

## 2021-01-29 DIAGNOSIS — G4700 Insomnia, unspecified: Secondary | ICD-10-CM

## 2021-01-29 DIAGNOSIS — D869 Sarcoidosis, unspecified: Secondary | ICD-10-CM

## 2021-01-29 DIAGNOSIS — G4733 Obstructive sleep apnea (adult) (pediatric): Secondary | ICD-10-CM

## 2021-01-29 DIAGNOSIS — E1143 Type 2 diabetes mellitus with diabetic autonomic (poly)neuropathy: Secondary | ICD-10-CM

## 2021-01-29 DIAGNOSIS — M797 Fibromyalgia: Secondary | ICD-10-CM

## 2021-01-29 DIAGNOSIS — Z6838 Body mass index (BMI) 38.0-38.9, adult: Secondary | ICD-10-CM

## 2021-01-29 DIAGNOSIS — F419 Anxiety disorder, unspecified: Secondary | ICD-10-CM

## 2021-01-29 NOTE — Patient Instructions (Addendum)
Diet:  CORE FOOD PLAN.  PLEASE CLINIC ON LINK FOR COMPREHENSIVE GUIDe   [email or web address]    Consult has been placed to integrative nutritionist to support this.      Exercise:    [facility] - consult has been placed  Qi Gong-8 [place] exercise [email or web address]   Chair yoga        Sleep:    Please follow up with Pulm/sleep medicine    sleep Hygiene:   1.  Avoid caffeine, alcohol prior to bed  2.  Avoid looking at your ipad/phone or watching TV prior to bed  3.  Avoid heavy meals prior to bed  4.  Avoid heavy exercise or any stressful work 30 minutes prior to bed time  5.  Using your bed for anything other than sleep    Try:   1.  Mindful Meditation  [email or web address] (Free [place] guided podcast for mindful meditation)  2.  Camomile Tea    Massage:   1. Neck (under ear)  2. Inner wrist (3 fingers from wrist crease)  3. Inner ankle (4 fingers from inner ankle bone)    Mind body medicine:       [email or web address]  Headspace  or CALM    Consult to pain psychology      Supplements:    Digestion: probiotics (recommend pure or metagenics),  [employer] Rapairvite-SE3    Diabetes: berberine  500mg  daily (recommend thorne brand)   Omega 3/ DHA/EPA  (nordic naturals)   Mg glycinate  250 mg daily (please stop if having worsening GI symptoms)

## 2021-02-02 ENCOUNTER — Other Ambulatory Visit (INDEPENDENT_AMBULATORY_CARE_PROVIDER_SITE_OTHER): Payer: Self-pay | Admitting: Student in an Organized Health Care Education/Training Program

## 2021-02-02 DIAGNOSIS — T7840XA Allergy, unspecified, initial encounter: Secondary | ICD-10-CM

## 2021-02-02 DIAGNOSIS — K219 Gastro-esophageal reflux disease without esophagitis: Secondary | ICD-10-CM

## 2021-02-02 DIAGNOSIS — I10 Essential (primary) hypertension: Secondary | ICD-10-CM

## 2021-02-02 DIAGNOSIS — D869 Sarcoidosis, unspecified: Secondary | ICD-10-CM

## 2021-02-04 NOTE — Telephone Encounter (Signed)
Established with: Kimberly Curtis   Last OV with PCP: 01/14/2021   Next OV with PCP: 04/16/2021     Requested Medication(s):  Requested Prescriptions     Pending Prescriptions Disp Refills   . losartan (COZAAR) 50 MG tablet [Pharmacy Med Name: LOSARTAN POTASSIUM 50 MG TAB] 90 tablet 1     Sig: TAKE 1 TABLET BY MOUTH EVERY DAY   . pantoprazole (PROTONIX) 40 MG tablet [Pharmacy Med Name: PANTOPRAZOLE SOD DR 40 MG TAB] 90 tablet 1     Sig: TAKE 1 TABLET BY MOUTH EVERY MORNING BEFORE BREAKFAST   . folic acid (FOLVITE) 1 MG tablet [Pharmacy Med Name: FOLIC ACID 1 MG TABLET] 90 tablet 1     Sig: TAKE 1 TABLET BY MOUTH EVERY DAY   . montelukast (SINGULAIR) 10 MG tablet [Pharmacy Med Name: MONTELUKAST SOD 10 MG TABLET] 90 tablet 1     Sig: TAKE 1 TABLET BY MOUTH EVERY EVENING       Send to:     [id number] [place] [place] [address]  Phone: [phone] Fax: [phone]       Last labs:   Lab Results   Component Value Date    CHOL 217 10/30/2020    HDL 72 10/30/2020    LDLCALC 119 10/30/2020    TRIG 129 10/30/2020    TSH 1.29 07/09/2020    A1C 7.3 (H) 10/30/2020        Blood Pressure   01/14/21 139/63   01/10/21 156/68   12/18/20 124/64        Health Maintenance Due   Topic Date Due   . Breast Cancer Screen  Never done   . Diabetic Retinal Exam  Never done   . Medicare Annual Wellness Visit  Never done

## 2021-02-05 ENCOUNTER — Other Ambulatory Visit (INDEPENDENT_AMBULATORY_CARE_PROVIDER_SITE_OTHER): Payer: Self-pay | Admitting: Psychiatry

## 2021-02-05 DIAGNOSIS — F331 Major depressive disorder, recurrent, moderate: Secondary | ICD-10-CM

## 2021-02-05 MED ORDER — PANTOPRAZOLE SODIUM 40 MG OR TBEC
40.0000 mg | DELAYED_RELEASE_TABLET | Freq: Every day | ORAL | 3 refills | Status: DC
Start: 2021-02-05 — End: 2022-03-19

## 2021-02-05 MED ORDER — MONTELUKAST SODIUM 10 MG OR TABS
10.0000 mg | ORAL_TABLET | Freq: Every evening | ORAL | 3 refills | Status: DC
Start: 2021-02-05 — End: 2022-03-19

## 2021-02-05 NOTE — Telephone Encounter (Signed)
[facility]     *The requested med passed all protocol parameters in the associated med info guide - Protocol details reviewed by pharmacist.       Proton Pump Inhibitor Refill Protocol    Last visit in enc specialty: 01/14/2021     Recent Visits in This Encounter Department     Date Provider Department Visit Type Primary Dx    01/14/2021 [name], MD [facility] [place] [facility] Visit Chronic pain syndrome    12/18/2020 [name], MD [facility] discharge follow-up    11/21/2020 [name], MD [facility] [place] Shortness of breath    10/12/2020 [name], MD [place] [facility] discharge follow-up    09/18/2020 [name], MD [place] [place] [facility] Visit Nausea         Population Health Visits  Recent [facility] Visits    None       Next appt in enc specialty: 04/16/2021      Future Appointments 02/05/2021 - 02/04/2026      Date Visit Type Department Provider     02/18/2021 12:00 PM RETURN [facility] [place] [facility] Nicole Kindred Lap-Kay, MD    Appointment Notes:     LOA needed*3 mon x SarcoidosisPt to cmpt: Chest XR             03/27/2021  9:00 AM NEW OPHTHALMOLOGY [facility] Sabino Gasser, MD, PhD    Appointment Notes:     [place] / PT ALREADY NOTIFIED DR @ [facility]             04/01/2021  2:00 PM [place] [facility]  Arrive at: [place], 3rd Floor Check in [place], Alvester Chou, MD             04/02/2021 12:30 PM APC NP IN [place] PROVIDER A [place]    Appointment Notes:     [facility] Dr. Rupert Stacks  1/23 [place] [facility]              04/16/2021  1:00 PM [facility] Therisa Doyne, MD    Appointment Notes:     Follow up             07/18/2021  3:00 PM  [place] Nguyen, [place] Steele Sizer, NP    Appointment Notes:     [facility]                      LABS required:  (None)    Monitoring required:  (None)  Kimberly Curtis, CPhT  (Rx Refill and PA Clinic)      Leukotriene Receptor Antagonist Refill Protocol    Last visit in enc specialty: 01/14/2021     Recent Visits in This Encounter Department     Date Provider Department Visit Type Primary Dx    01/14/2021 [name], MD [place] Office Visit Chronic pain syndrome    12/18/2020 [name], MD [facility] discharge follow-up    11/21/2020 [name], MD [facility] Shortness of breath    10/12/2020 [name], MD  [facility] discharge follow-up  09/18/2020 [name], [place] Office Visit Nausea         Population Health Visits  Recent [facility] Visits    None       Next f/u appt due:  Return in about 3 months (around 04/16/2021).  Next appt in enc specialty: 04/16/2021      Future Appointments 02/05/2021 - 02/04/2026      Date Visit Type Department Provider     02/18/2021 12:00 PM RETURN AL[facility] [place] [facility] Pulmonary and Sleep Medicine Nicole Kindred Lap-Kay, MD    Appointment Notes:     LOA needed*3 mon x SarcoidosisPt to cmpt: Chest XR             03/27/2021  9:00 AM NEW OPHTHALMOLOGY [place] [facility] Sabino Gasser, MD, PhD    Appointment Notes:     [place] [facility] [place] NOTIFIED DR @ [facility]             04/01/2021  2:00 PM [place] [facility]  Arrive at: [place], 3rd Floor Check in [place], Alvester Chou, MD             04/02/2021 12:30 PM APC NP IN PERSON APPOINTMENT [place] [facility] ANESTHESIA PRE-OP PROVIDER A [place]    Appointment Notes:     [facility] Dr. Rupert Stacks  1/23 [place] [facility]              04/16/2021  1:00 PM  RETURN PRIMARY EXTEND [place] [facility] Therisa Doyne, MD    Appointment Notes:     Follow up             07/18/2021  3:00 PM [place] Nguyen, [place] Steele Sizer, NP    Appointment Notes:     [facility]                  Per [place]  [phone]  HPI-Sarcoidosisc/b frequent PNA/bronchitis s/p bronchial stent-previously onchronic steroids,tapered offPrednisone 10 mg,Albuterol neb PRN, MDI uses more frequently, montelukast 10 mg daily, Trelegy daily. Had bronchial stents recently taken out on 04/30/2020 in [place]. Last CT scan 04/2020.[facility] CT chest.On 3L 02continuous  Chronic coughcausingB/l abd/thoracic pain-intermittent. Previously prescribed Tussionex  Allergies-Allegra 180 daily      LABS required:  (None)    Monitoring required:  (None)

## 2021-02-05 NOTE — Telephone Encounter (Addendum)
[facility]     Folic Acid is not currently included in the Pharmacy Refill Clinic protocols. Re-routing to the responsible staff for processing.  Thank you    ---------------------------------------------------------------------------  The following meds were approved per protocol:    Medications ordered in this Encounter   Medications    pantoprazole (PROTONIX) 40 MG tablet     Sig: Take 1 tablet (40 mg) by mouth every morning (before breakfast).     Dispense:  90 tablet     Refill:  3    montelukast (SINGULAIR) 10 MG tablet     Sig: Take 1 tablet (10 mg) by mouth every evening.     Dispense:  90 tablet     Refill:  3

## 2021-02-06 ENCOUNTER — Encounter (INDEPENDENT_AMBULATORY_CARE_PROVIDER_SITE_OTHER): Payer: Self-pay | Admitting: Student in an Organized Health Care Education/Training Program

## 2021-02-06 ENCOUNTER — Encounter (INDEPENDENT_AMBULATORY_CARE_PROVIDER_SITE_OTHER): Payer: BLUE CROSS/BLUE SHIELD | Admitting: Pulmonary Medicine

## 2021-02-06 MED ORDER — FOLIC ACID 1 MG OR TABS
ORAL_TABLET | ORAL | 1 refills | Status: DC
Start: 2021-02-06 — End: 2021-07-12

## 2021-02-06 NOTE — Telephone Encounter (Signed)
Established with: [name]   Last OV with PCP: 01/14/2021   Next OV with PCP: 04/16/2021         Requested Medication(s):  Requested Prescriptions     Pending Prescriptions Disp Refills   . folic acid (FOLVITE) 1 MG tablet [Pharmacy Med Name: FOLIC ACID 1 MG TABLET] 90 tablet 1     Sig: TAKE 1 TABLET BY MOUTH EVERY DAY     Signed Prescriptions Disp Refills   . pantoprazole (PROTONIX) 40 MG tablet 90 tablet 3     Sig: Take 1 tablet (40 mg) by mouth every morning (before breakfast).     Authorizing Provider: [name]     Ordering User: HASLEBACHER, MERIAH   . montelukast (SINGULAIR) 10 MG tablet 90 tablet 3     Sig: Take 1 tablet (10 mg) by mouth every evening.     Authorizing Provider: [name]     Ordering User: Rona Ravens     Refused Prescriptions Disp Refills   . losartan (COZAAR) 50 MG tablet [Pharmacy Med Name: LOSARTAN POTASSIUM 50 MG TAB] 90 tablet 1     Sig: TAKE 1 TABLET BY MOUTH EVERY DAY     Refused By: Luan Moore     Reason for Refusal: Patient already has refills on file at pharmacy       Send to:     [id number] - [place], [place] [address]  Phone: [phone] Fax: [phone]       Last labs:   Lab Results   Component Value Date    CHOL 217 10/30/2020    HDL 72 10/30/2020    LDLCALC 119 10/30/2020    TRIG 129 10/30/2020    TSH 1.29 07/09/2020    A1C 7.3 (H) 10/30/2020        Blood Pressure   01/14/21 139/63   01/10/21 156/68   12/18/20 124/64        Health Maintenance Due   Topic Date Due   . Breast Cancer Screen  Never done   . Diabetic Retinal Exam  Never done   . Medicare Annual Wellness Visit  Never done

## 2021-02-07 ENCOUNTER — Other Ambulatory Visit (INDEPENDENT_AMBULATORY_CARE_PROVIDER_SITE_OTHER): Payer: Self-pay | Admitting: Student in an Organized Health Care Education/Training Program

## 2021-02-07 DIAGNOSIS — R110 Nausea: Secondary | ICD-10-CM

## 2021-02-07 DIAGNOSIS — L0292 Furuncle, unspecified: Secondary | ICD-10-CM

## 2021-02-07 NOTE — Telephone Encounter (Signed)
[facility]     Mupirocin, ondansetron  is not currently included in the Pharmacy Refill Clinic protocols. Re-routing to the responsible staff for processing.  Thank you

## 2021-02-08 MED ORDER — MIRTAZAPINE 15 MG OR TABS
15.0000 mg | ORAL_TABLET | Freq: Every evening | ORAL | 1 refills | Status: DC | PRN
Start: 2021-02-08 — End: 2021-04-01

## 2021-02-08 MED ORDER — SERTRALINE HCL 25 MG OR TABS
ORAL_TABLET | ORAL | 1 refills | Status: DC
Start: 2021-02-08 — End: 2021-04-01

## 2021-02-11 MED ORDER — MUPIROCIN 2 % EX OINT
1.0000 | TOPICAL_OINTMENT | Freq: Two times a day (BID) | CUTANEOUS | 1 refills | Status: DC
Start: 2021-02-11 — End: 2021-06-14

## 2021-02-11 MED ORDER — ONDANSETRON 8 MG OR TBDP
ORAL_TABLET | ORAL | 0 refills | Status: DC
Start: 2021-02-11 — End: 2021-03-05

## 2021-02-18 ENCOUNTER — Encounter (INDEPENDENT_AMBULATORY_CARE_PROVIDER_SITE_OTHER): Payer: BLUE CROSS/BLUE SHIELD | Admitting: Pulmonary Medicine

## 2021-02-18 ENCOUNTER — Encounter (INDEPENDENT_AMBULATORY_CARE_PROVIDER_SITE_OTHER): Payer: Self-pay

## 2021-02-19 ENCOUNTER — Encounter (INDEPENDENT_AMBULATORY_CARE_PROVIDER_SITE_OTHER): Payer: Self-pay | Admitting: Student in an Organized Health Care Education/Training Program

## 2021-02-19 DIAGNOSIS — J9611 Chronic respiratory failure with hypoxia: Secondary | ICD-10-CM

## 2021-02-20 ENCOUNTER — Encounter (INDEPENDENT_AMBULATORY_CARE_PROVIDER_SITE_OTHER): Payer: Self-pay | Admitting: Student in an Organized Health Care Education/Training Program

## 2021-02-21 NOTE — Telephone Encounter (Signed)
General Inquiry     Who is calling: Incoming call from spouse Danne Baxter    Reason for this call:     Patient spouse is calling back requesting a standard oxygen device to be referred. Please advise, thank you!    Action required by office: Please contact caller     Duplicate encounter? No previous documentation found on this issue.     Best way to contact: [phone]    Inquiry has been read verbatim to this caller. Verbalizes satisfaction and confirms the above is accurate: yes    Has been advised this message will be transmitted to office and can expect a response within the next 24-72 hours.    Encounter created by Care Assist MA.  If further action required please route encounter to appropriate in clinic MA/LVN/Resident Pool

## 2021-02-22 NOTE — Telephone Encounter (Signed)
LVM for pt asking her to call back or provide additional information as what she needs exactly. Called Nathaly at [phone] and facility was closed.

## 2021-02-25 ENCOUNTER — Telehealth (INDEPENDENT_AMBULATORY_CARE_PROVIDER_SITE_OTHER): Payer: Self-pay | Admitting: Registered"

## 2021-02-25 DIAGNOSIS — J9611 Chronic respiratory failure with hypoxia: Secondary | ICD-10-CM | POA: Insufficient documentation

## 2021-02-25 NOTE — Telephone Encounter (Signed)
General Inquiry     Who is calling: Incoming call from spouse ervin    Reason for this call: Ervin requesting a call back asap since pt needs a new oxygen machine. Pt is currently using her portable machine, but it doesn't provide as much oxygen compared to her usual machine. Pended order.    Action required by office: Please process request     Duplicate encounter? No previous documentation found on this issue.     Best way to contact: [phone]  Alternative: [phone]    Inquiry has been read verbatim to this caller. Verbalizes satisfaction and confirms the above is accurate: yes    Has been advised this message will be transmitted to office and can expect a response within the next 24-72 hours.    Encounter created by Care Assist MA.  If further action required please route encounter to appropriate in clinic MA/LVN/Resident Pool

## 2021-02-25 NOTE — Telephone Encounter (Signed)
Routing to [facility] Pt navigator team for insurance authorization and scheduling with [facility].

## 2021-02-26 ENCOUNTER — Encounter (INDEPENDENT_AMBULATORY_CARE_PROVIDER_SITE_OTHER): Payer: Self-pay | Admitting: Student in an Organized Health Care Education/Training Program

## 2021-02-26 NOTE — Telephone Encounter (Signed)
Per referral team:  STAT Referral for DME and unfortunately, the referral will be denied. The patient became effective with [facility] and is assigned to [facility] with Dr. Theressa Stamps as the PCP  Will need to contact PCP to switch to myself

## 2021-02-27 NOTE — Telephone Encounter (Signed)
Left pt detailed message to relay message, any further questions pt to call back

## 2021-02-27 NOTE — Telephone Encounter (Signed)
From: Hortensia Formisano  To: [name], MD  Sent: 02/26/2021 2:10 PM PST  Subject: Oxygen machine     Did you guys send in all the information for the oxygen machine I'm just got a message that I'm not in [place] but I am unless they keep putting me in the wrong group I told them I wanted the network with Dr Jeannette How

## 2021-02-27 NOTE — Telephone Encounter (Signed)
Duplicate encounter, closing.

## 2021-02-28 ENCOUNTER — Encounter (INDEPENDENT_AMBULATORY_CARE_PROVIDER_SITE_OTHER): Payer: Self-pay | Admitting: Student in an Organized Health Care Education/Training Program

## 2021-03-04 ENCOUNTER — Other Ambulatory Visit (INDEPENDENT_AMBULATORY_CARE_PROVIDER_SITE_OTHER): Payer: Self-pay | Admitting: Student in an Organized Health Care Education/Training Program

## 2021-03-04 DIAGNOSIS — R110 Nausea: Secondary | ICD-10-CM

## 2021-03-04 NOTE — Telephone Encounter (Signed)
[facility]     zofran is not currently included in the Pharmacy Refill Clinic protocols. Re-routing to the responsible staff for processing.  Thank you

## 2021-03-05 MED ORDER — ONDANSETRON 8 MG OR TBDP
ORAL_TABLET | ORAL | 2 refills | Status: DC
Start: 2021-03-05 — End: 2021-05-10

## 2021-03-17 ENCOUNTER — Other Ambulatory Visit (INDEPENDENT_AMBULATORY_CARE_PROVIDER_SITE_OTHER): Payer: Self-pay | Admitting: Gastroenterology

## 2021-03-17 LAB — EMMI , COLONOSCOPY: EMMI Video Order Number: 14134091660

## 2021-03-27 ENCOUNTER — Ambulatory Visit (INDEPENDENT_AMBULATORY_CARE_PROVIDER_SITE_OTHER): Payer: No Typology Code available for payment source | Admitting: Ophthalmology

## 2021-03-27 DIAGNOSIS — E119 Type 2 diabetes mellitus without complications: Secondary | ICD-10-CM

## 2021-03-27 DIAGNOSIS — Z0000 Encounter for general adult medical examination without abnormal findings: Secondary | ICD-10-CM

## 2021-03-27 NOTE — Progress Notes (Signed)
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    Lafe Garin MD, PhD

## 2021-04-01 ENCOUNTER — Telehealth (INDEPENDENT_AMBULATORY_CARE_PROVIDER_SITE_OTHER): Payer: Self-pay | Admitting: Psychiatry

## 2021-04-01 ENCOUNTER — Telehealth (INDEPENDENT_AMBULATORY_CARE_PROVIDER_SITE_OTHER): Payer: No Typology Code available for payment source | Admitting: Psychiatry

## 2021-04-01 DIAGNOSIS — F331 Major depressive disorder, recurrent, moderate: Secondary | ICD-10-CM

## 2021-04-01 MED ORDER — MIRTAZAPINE 30 MG OR TABS
30.0000 mg | ORAL_TABLET | Freq: Every evening | ORAL | 1 refills | Status: DC
Start: 2021-04-01 — End: 2021-04-26

## 2021-04-01 MED ORDER — SERTRALINE HCL 100 MG OR TABS
ORAL_TABLET | ORAL | 1 refills | Status: DC
Start: 2021-04-01 — End: 2021-04-26

## 2021-04-01 NOTE — Telephone Encounter (Signed)
Left voicemail for patient to give office a call back to schedule follow-up w/ Dr. Lyndel Safe

## 2021-04-01 NOTE — Anesthesia Preprocedure Evaluation (Addendum)
ANESTHESIA PRE-OPERATIVE EVALUATION    Patient Information    Name: Kimberly Curtis    MRN: [id number]    DOB: 05/09/1955    Age: 65 year old    Sex: female  Procedure(s):  GI COLONOSCOPY (open access)      Pre-op Vitals:   BP (!) 196/82 (BP Location: Left arm, BP Patient Position: Sitting, BP cuff size: Large)    Pulse 80    Temp 97.5 °F (36.4 °C) (Temporal)    Resp 16    Ht 5' 5" (1.651 m)    Wt 99.6 kg (219 lb 9.3 oz)    SpO2 99%    BMI 36.54 kg/m²         Primary language spoken:  English    ROS/Medical History:      History of Present Illness: 65 yo F sf colonoscopy 04/15/21   Hx obesity, lung sarcoidosis-bronchial stent , htn, iddm2, fibromyalgia ,osa no device ,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   , neuropathy, stage 3 ckd ,gerd and nausea ,cardiac sarcoidosis       a1c --ordered see epic   [facility]     General:  positive for Obesity,  not able to climb flight of stairs//Exercise tolaerance <4 mets,  Walks with o2 3 L ~ 1 block or less  Cardiovascular:  no CAD/Angina/MI/CABG/Stents, Anti-platelet drugs: Yes, 81 asa ,   hypertension,  dysrhythmias (vt, incomplete LBBB),  pacemaker,  ICD,  Bp elevated states she just took her htn meds denies any assoc cv sx's   cardiac sarcoidosis   Hx of VFIB/TACH/prolonged QTC-s/p DC AICD placement 7/16-7/20/22 at [facility], following with Cardiology at [facility]    Ekg  12/03/2020    Sinus rhythm with occasional atrial-paced complexes and with occasional   Premature ventricular complexes and Premature atrial complexes   Septal infarct , age undetermined   Possible Lateral infarct , age undetermined   Abnormal ECG      Last interrogated months ago       --------------------------------------------  Type of Device implanted: ICD  Location: left upper chest  Manufacturer: [employer]  Model/Serial number:  Vigilant icd  D [id number]  Date Implanted: 09/29/20  Reason for implantation: SUSTAINED AND NON SUSTAINED VT   Last device  interrogation: 12/2020 , full report in chart review->media  Current mode:DDD  Pacemaker dependent NO   Pacing threshold/parameters: Low - 60 bpm, High - 130 bpm  Battery life: 13 yr     Magnet mode: [employer]: ICD - Magnet suspends tachy arrhythmia therapy, does not affect pacer (if dependent or surgery above umbilicus, may need rep)   Surgical field location: below umbilicus (if CIED in chest, magnet to be available)  Rep information: [employer]: Nick Wilson, [phone]; 1-800 CARDIAC/[phone]     Rep contacted  /NO   ---------------------------------  Case d/w dr tzeng ------------   Anesthesia History:  no history of anesthetic complications,  chronic pain patient,  no family history of anesthetic complications,   Pulmonary:   home oxygen use,  sleep apnea (no device ),  no recent URI/pneumonia,  Hx of sarcoidosis s/p bronchial stents.  She was dx with this in 1990.  She has been on 3 L O2.  She is followed by Pulm  .  She does have a chronic cough that will cause abdominal and thoracic pain intermittently.  Previously prescribed Tussionex.         Is at her current respiratory baseline      Neuro/Psych:   negative for TIA/CVA,  no seizures,  no neuromuscular disease,  psychiatric history (anxiety),   Hematology/Oncology:   hematologic/lymphatic negative      GI/Hepatic:  GERD (ppi+), well controlled,  bowel prep,   Infectious Disease:  negative for infectious disease     Renal:  chronic renal disease,  Stage 3 ckd  Endocrine/Other:  diabetes, using insulin, poorly controlled,  back pain,  Lantus 48 un in pm   novolog     a1c 7.3% 09/2020     Has back and shoulder pain     Patient states she no longer on steroids    Pregnancy History:   Pediatrics:         Pre Anesthesia Testing (PCC/CPC) notes/comments:    PCC Test & records reviewed by PCC Provider.                                   Physical Exam    Airway:    Inter-inciser distance > 4 cm  Prognanth Able    Mallampati: II  Neck ROM:  full  Short thick neck: Yes          Cardiovascular:  - cardiovascular exam normal   Comment: aicd left upper chest    Rhythm: regular   Rate: normal     Negative for murmur     Pulmonary:  - pulmonary exam normal      breath sounds clear to auscultation        Neuro/Neck/Skeletal/Skin:  - Neck/Neuro/Skeletal/Skin exam normal          Dental:    Comment: Very poor dentition - normal exam      Abdominal:   - normal exam     General: obesity     Additional Clinical Notes:               Last  OSA (STOP BANG) Score:  No data recorded    Last OSA  (STOP) Score for   No data recorded                 Past Medical History:   Diagnosis Date   • Diabetes mellitus (CMS-HCC)    • Hypertension    • Sarcoidosis of lung (CMS-HCC)     bilat     Past Surgical History:   Procedure Laterality Date   • CHOLECYSTECTOMY     • HYSTERECTOMY     • KNEE ARTHROSCOPY Left      Social History     Socioeconomic History   • Marital status: Married   Tobacco Use   • Smoking status: Former   • Smokeless tobacco: Never   Substance and Sexual Activity   • Alcohol use: Not Currently   • Drug use: Never     Alcohol Use: Not on file       Current Outpatient Medications   Medication Sig Dispense Refill   • albuterol (PROVENTIL) (2.5 MG/3ML) 0.083% nebulization 3 mL (2.5 mg) by Nebulization route every 4 hours as needed for Wheezing. 1 each 2   • albuterol 108 (90 Base) MCG/ACT inhaler Inhale 2 puffs by mouth every 6 hours as needed for Wheezing. 1 each 0   • Albuterol Sulfate 108 (90 Base) MCG/ACT AEPB Inhale 1 puff by mouth as needed.     • ASPIRIN LOW DOSE 81 MG EC tablet TAKE 1 TABLET BY MOUTH EVERY DAY 120   tablet 1    atorvastatin (LIPITOR) 40 MG tablet Take 1 tablet (40 mg) by mouth daily. 90 tablet 3    Continuous Blood Gluc Receiver (CONTINUOUS BLOOD GLUCOSE, DEXCOM G6, RECEIVER) Use daily to read glucose topically 1 each 0    Continuous Blood Gluc Sensor (CONTINUOUS BLOOD GLUCOSE, DEXCOM G6, SENSOR) Insert subcutaneously every 10 days. 10 each  4    Continuous Blood Gluc Transmit (CONTINUOUS BLOOD GLUCOSE, DEXCOM G6, TRANSMITTER) Use daily to transmit subcutaneous glucose value. Change every 3 months. 1 each 4    famotidine (PEPCID) 40 MG tablet TAKE 1 TABLET BY MOUTH EVERY DAY 90 tablet 1    fexofenadine (ALLEGRA) 180 MG tablet Take 1 tablet (180 mg) by mouth daily. 90 tablet 1    fluticasone-umeclidinium-vilanterol (TRELEGY ELLIPTA) 100-62.5-25 MCG/INH AEPB Inhale 1 puff by mouth daily.      folic acid (FOLVITE) 1 MG tablet TAKE 1 TABLET BY MOUTH EVERY DAY 90 tablet 1    Hydrocod Polst-CPM Polst ER 10-8 MG/5ML SUER Take 5 mL by mouth every 12 hours as needed (cough). 70 mL 0    hydrOXYzine HCL (ATARAX) 50 MG tablet Take 1 tablet (50 mg) by mouth 2 times daily as needed for Itching. 180 tablet 3    insulin glargine (LANTUS SOLOSTAR) 100 units/mL injection pen Inject 48 Units under the skin nightly. 15 mL 2    lidocaine (LIDODERM) 5 % patch Apply 1 patch topically every 24 hours. Leave patch on for 12 hours, then remove for 12 hours. 30 patch 3    losartan (COZAAR) 50 MG tablet Take 1 tablet (50 mg) by mouth daily. 90 tablet 1    metoprolol tartrate (LOPRESSOR) 50 MG tablet Take 1 tablet (50 mg) by mouth 2 times daily. 180 tablet 1    mirtazapine (REMERON) 15 MG tablet TAKE 1 TABLET (15 MG) BY MOUTH NIGHTLY AS NEEDED (INSOMNIA). 90 tablet 1    montelukast (SINGULAIR) 10 MG tablet Take 1 tablet (10 mg) by mouth every evening. 90 tablet 3    mupirocin (BACTROBAN) 2 % ointment APPLY 1 APPLICATION TOPICALLY 2 TIMES DAILY. USE A SMALL AMOUNT AS DIRECTED 22 g 1    naloxone (NARCAN) 4 mg/0.1 mL nasal spray Spray 1 spray into one nostril once as needed.      NOVOLOG FLEXPEN 100 UNIT/ML injection pen INJECT 20 UNITS UNDER THE SKIN 3 TIMES DAILY AS NEEDED FOR HIGH BLOOD SUGAR. IN THE MORNING 20 UNITS + SLIDING SCALE MID DAY: 17 + SLIDING SCALE AND IN THE EVENING 12 + SLIDING SCALE 15 each 11    ondansetron (ZOFRAN ODT) 8 MG disintegrating tablet  TAKE 1 TABLET ON OR UNDER THE TONGUE EVERY 8 HOURS AS NEEDED FOR NAUSEA/VOMITING. 30 tablet 2    oxycodone-acetaminophen (PERCOCET) 10-325 MG tablet Take 1 tablet by mouth every 8 hours as needed for Severe Pain (Pain Score 7-10). 90 tablet 0    pantoprazole (PROTONIX) 40 MG tablet Take 1 tablet (40 mg) by mouth every morning (before breakfast). 90 tablet 3    predniSONE (DELTASONE) 10 MG tablet Take 1 tablet (10 mg) by mouth daily. 90 tablet 1    pregabalin (LYRICA) 75 MG capsule Take 1 capsule (75 mg) by mouth every morning AND 2 capsules (150 mg) every evening. 1 tablet AM and 2 tablets PM. 90 capsule 2    sertraline (ZOLOFT) 25 MG tablet TAKE 1 TABLET BY MOUTH EVERY MORNING 90 tablet 1    TECHLITE PEN NEEDLES 32G X 4  MM MISC USE ONE PEN NEEDLE WITH EACH INSULIN ADMINISTRATION. 200 each 11     No current facility-administered medications for this visit.     Allergies   Allergen Reactions    Sulfa Drugs Anaphylaxis    Benadryl [Diphenhydramine] Rash and Itching     Per 12/25/20 TE: Do you have any additional known allergies to over the counter or prescription medications? Yes   If yes, please list additional medication(s) dye codeine on benadryl  and explain reaction(s)itching, rash     Latex Itching     Per 12/25/20 TE: Are you allergic to latex? Yes   If yes, please explain reaction(s)itchiness     Morphine Itching       Labs and Other Data  Lab Results   Component Value Date    NA 142 11/21/2020    K 4.2 11/21/2020    CL 103 11/21/2020    BICARB 30 (H) 11/21/2020    BUN 15 11/21/2020    CREAT 0.97 (H) 11/21/2020    GLU 237 (H) 11/21/2020    [place] 9.4 11/21/2020     Lab Results   Component Value Date    AST 15 11/21/2020    ALT 26 11/21/2020    ALK 138 (H) 11/21/2020    TP 6.6 11/21/2020    ALB 4.1 11/21/2020    TBILI 0.21 11/21/2020     Lab Results   Component Value Date    WBC 9.3 11/21/2020    RBC 4.13 11/21/2020    HGB 10.2 (L) 11/21/2020    HCT 35.6 11/21/2020    MCV 86.2 11/21/2020    MCHC 28.7 (L)  11/21/2020    RDW 17.0 (H) 11/21/2020    PLT 251 11/21/2020    MPV 11.1 11/21/2020    SEG 71 11/21/2020    LYMPHS 16 11/21/2020    MONOS 11 11/21/2020    EOS 1 11/21/2020    BASOS 0 11/21/2020     No results found for: INR, PTT  No results found for: ARTPH, ARTPO2, ARTPCO2    Anesthesia Plan:  Risks and Benefits of Anesthesia  I have personally performed an appropriate pre-anesthesia physical exam of the patient (including heart, lungs, and airway) prior to the anesthetic and reviewed the pertinent medical history, drug and allergy history, laboratory and imaging studies and consultations.   I have determined that the patient has had adequate assessment and testing.  I have validated the documentation of these elements of the patient exam and/or have made necessary changes to reflect my own observations during my pre-anesthesia exam.  Anesthetic techniques, invasive monitors, anesthetic drugs for induction, maintenance and post-operative analgesia, risks and alternatives have been explained to the patient and/or patient's representatives.    I have prescribed the anesthetic plan:         Planned anesthesia method: Monitored Anesthesia Care         ASA 3 (Severe systemic disease)     Potential anesthesia problems identified and risks including but not limited to the following were discussed with patient and/or patient's representative: Adverse or allergic drug reaction, Recall, Ocular injury, Dental injury or sore throat, Nerve injury and Injury to brain, heart and other organs    No Beta Blocker Indicated: Does not meet criteria    Planned monitoring method: Routine monitoring    Informed Consent:  Interpreter used: Present in person  Anesthetic plan and risks discussed with Patient.    Plan discussed with CRNA, Surgeon, OR Nurse and Attending.

## 2021-04-01 NOTE — Progress Notes (Signed)
Psychiatry Video Visit Follow up Appointment    Patient Verification & Telemedicine Consent & Financial Waiver:    1.   Identity: I have verified this patient's identity to be accurate.  2.   Consent: I verify consent has been secured in one of the following methods: (a) obtained written/ online attestation consent (via MyChartVideoVisit pathway), (b) the spoke-side provider has obtained verbal or written consent from patient/surrogate (if this is a "provider to provider" evaluation), or (c) in all other cases, I have personally obtained verbal consent from the patient/ surrogate (noting all elements below) to perform this voluntary telemedicine evaluation (including obtaining history, performing examination and reviewing data provided by the patient).   The patient/ surrogate has the right to refuse this evaluation.  I have explained risks (including potential loss of confidentiality), benefits, alternatives, and the potential need for subsequent face to face care. Patient/ surrogate understands that there is a risk of medical inaccuracies given that our recommendations will be made based on reported data (and we must therefore assume this information is accurate).  Knowing that there is a risk that this information is not reported accurately, and that the telemedicine video, audio, or data feed may be incomplete, the patient agrees to proceed with evaluation and holds us harmless knowing these risks.  3.   Healthcare Team: The patient/ surrogate has been notified that other healthcare professionals (including students, residents and technical personnel) may be involved in this audio-video evaluation.   All laws concerning confidentiality and patient access to medical records and copies of medical records apply to telemedicine.  4.   Privacy: If this is a MyChart Video Visit, the patient/ surrogate has received the [place] Notice of Privacy Practices via E-Checkin process.  For all other video visit techniques, I have  verbally provided the patient/ surrogate with the [place] web link in English ([email or web address]) or Spanish ([email or web address]).  The patient/ surrogate acknowledges both being provided the NPP link, and has been offered to have the NPP mailed to the patient/ surrogate by US mail.  The patient/ surrogate has voiced understanding an acknowledgement of receipt of this NPP web address.  If the patient/surrogate has elected to receive the NPP via US mail, I verify that the NPP will be sent promptly to the patient/surrogate via US mail.  5.   Capacity: I have reviewed this above verification and consent paragraph with the patient/ surrogate and the patient is capacitated or has a surrogate. If the patient is not capacitated to understand the above, and no surrogate is available, since this is not an emergency evaluation, the visit will be rescheduled until such time that the patient can consent, or the surrogate is available to consent. If this is an emergency evaluation and the patient is not capacitated to understand the above, and no surrogate is available, I am proceeding with this evaluation as this is felt to be an emergency setting and no appropriate specialist is available at the bedside to perform these evaluations.  6.   Financial Waiver: If this is a MyChart Video Visit, the patient has been made aware of the financial waiver via E-Checkin process.  For all other video visit techniques, an E-Checkin process is not performed.  As such, I have personally verbally informed the patient/ surrogate that this evaluation will be a billable encounter similar to an in-person clinic visit, and the patient/ surrogate has agreed to pay the fee for services rendered.  If we are billing insurance for the patient's telehealth visit,   her out-of-pocket cost will be determined based on her plan and will be billed to her.  The patient/ surrogate has also been informed  that if the patient does not have insurance or does not wish to use insurance, [place] [employer] price for a primary care telehealth visit is $59.00 and specialist telehealth visit is $88.00.  I have further informed the patient/ surrogate that in the event the patient has additional services provided in conjunction with the specialty visit (Ex. Psychotherapy services), those services will be billed at the current rate less a 45% discount.  7.   Intra-State Location: The patient/ surrogate attests that the patient is in the state of [place] at the time of this evaluation.       Demographics:  Medical Record #: [id number]  Date: April 01, 2021  Patient Name: Kimberly Curtis  DOB: Apr 07, 1955  Age: 66 year old  Sex: female  Location: Home address on file  Phone: [phone]    Time video activated: 1400  Time video deactivated: 1430  Time spent preparing/documenting in same-day: [facility] Psychiatry Video Visit       HISTORY     Chief Complaint: follow up on anxiety and depression    HPI: Kimberly Curtis is a 66 year old female with a history of     1. Generalized anxiety disorder    2. Major depression, recurrent    3. Insomnia     who presents via video visit for a psychiatric follow up appointment.  Patient was last seen 10/20 at which time sertraline and mirtazapine were added.      Interim History:     "It is not that great"  I have good days and bad  days  Not sleeping - mirtazapine helped for awhile   Sertraine has not helped  Bad days - down  No interest in things  Jittery  Tired all the time  Not sleeping well  Can't concenbtarte  Body aches  Heart flutters    Physical activity limtied but she can walk around the house  Plans to see PCP and wants to start PT  Hard to breathe and heart flutters sometimes    Sometimes irritable    Past Psychiatric, Family, & Social History Updates:   Substance use: no change  Current work/support: no change     Past Medical History:  Patient Active  Problem List   Diagnosis   . Type 2 diabetes mellitus without complication, with long-term current use of insulin (CMS-HCC)   . Sarcoidosis   . Chronic pain syndrome   . Recurrent falls   . Chronic pain of both knees   . Gastroesophageal reflux disease, unspecified whether esophagitis present   . Nausea   . Essential hypertension   . Itching   . Mixed hyperlipidemia   . Anxiety   . Allergy, initial encounter   . S/P placement of cardiac pacemaker   . Vitamin D deficiency   . Fibromyalgia   . Chronic respiratory failure with hypoxia, on home oxygen therapy (CMS-HCC)       Allergies:   Allergies   Allergen Reactions   . Sulfa Drugs Anaphylaxis   . Benadryl [Diphenhydramine] Rash and Itching     Per 12/25/20 TE: Do you have any additional known allergies to over the counter or prescription medications? Yes   If yes, please list additional medication(s) dye codeine on benadryl  and explain reaction(s)itching, rash    . Latex Itching  Per 12/25/20 TE: Are you allergic to latex? Yes   If yes, please explain reaction(s)itchiness    . Morphine Itching       Medications:  Current Outpatient Medications on File Prior to Visit   Medication Sig Dispense Refill   . albuterol (PROVENTIL) (2.5 MG/3ML) 0.083% nebulization 3 mL (2.5 mg) by Nebulization route every 4 hours as needed for Wheezing. 1 each 2   . albuterol 108 (90 Base) MCG/ACT inhaler Inhale 2 puffs by mouth every 6 hours as needed for Wheezing. 1 each 0   . Albuterol Sulfate 108 (90 Base) MCG/ACT AEPB Inhale 1 puff by mouth as needed.     . ASPIRIN LOW DOSE 81 MG EC tablet TAKE 1 TABLET BY MOUTH EVERY DAY 120 tablet 1   . atorvastatin (LIPITOR) 40 MG tablet Take 1 tablet (40 mg) by mouth daily. 90 tablet 3   . Continuous Blood Gluc Receiver (CONTINUOUS BLOOD GLUCOSE, DEXCOM G6, RECEIVER) Use daily to read glucose topically 1 each 0   . Continuous Blood Gluc Sensor (CONTINUOUS BLOOD GLUCOSE, DEXCOM G6, SENSOR) Insert subcutaneously every 10 days. 10 each 4   .  Continuous Blood Gluc Transmit (CONTINUOUS BLOOD GLUCOSE, DEXCOM G6, TRANSMITTER) Use daily to transmit subcutaneous glucose value. Change every 3 months. 1 each 4   . famotidine (PEPCID) 40 MG tablet TAKE 1 TABLET BY MOUTH EVERY DAY 90 tablet 1   . fexofenadine (ALLEGRA) 180 MG tablet Take 1 tablet (180 mg) by mouth daily. 90 tablet 1   . fluticasone-umeclidinium-vilanterol (TRELEGY ELLIPTA) 100-62.5-25 MCG/INH AEPB Inhale 1 puff by mouth daily.     . folic acid (FOLVITE) 1 MG tablet TAKE 1 TABLET BY MOUTH EVERY DAY 90 tablet 1   . Hydrocod Polst-CPM Polst ER 10-8 MG/5ML SUER Take 5 mL by mouth every 12 hours as needed (cough). 70 mL 0   . hydrOXYzine HCL (ATARAX) 50 MG tablet Take 1 tablet (50 mg) by mouth 2 times daily as needed for Itching. 180 tablet 3   . insulin glargine (LANTUS SOLOSTAR) 100 units/mL injection pen Inject 48 Units under the skin nightly. 15 mL 2   . lidocaine (LIDODERM) 5 % patch Apply 1 patch topically every 24 hours. Leave patch on for 12 hours, then remove for 12 hours. 30 patch 3   . losartan (COZAAR) 50 MG tablet Take 1 tablet (50 mg) by mouth daily. 90 tablet 1   . metoprolol tartrate (LOPRESSOR) 50 MG tablet Take 1 tablet (50 mg) by mouth 2 times daily. 180 tablet 1   . mirtazapine (REMERON) 15 MG tablet TAKE 1 TABLET (15 MG) BY MOUTH NIGHTLY AS NEEDED (INSOMNIA). 90 tablet 1   . montelukast (SINGULAIR) 10 MG tablet Take 1 tablet (10 mg) by mouth every evening. 90 tablet 3   . mupirocin (BACTROBAN) 2 % ointment APPLY 1 APPLICATION TOPICALLY 2 TIMES DAILY. USE A SMALL AMOUNT AS DIRECTED 22 g 1   . naloxone (NARCAN) 4 mg/0.1 mL nasal spray Spray 1 spray into one nostril once as needed.     Marland Kitchen NOVOLOG FLEXPEN 100 UNIT/ML injection pen INJECT 20 UNITS UNDER THE SKIN 3 TIMES DAILY AS NEEDED FOR HIGH BLOOD SUGAR. IN THE MORNING 20 UNITS + SLIDING SCALE MID DAY: 17 + SLIDING SCALE AND IN THE EVENING 12 + SLIDING SCALE 15 each 11   . ondansetron (ZOFRAN ODT) 8 MG disintegrating tablet TAKE 1  TABLET ON OR UNDER THE TONGUE EVERY 8 HOURS AS NEEDED FOR NAUSEA/VOMITING. [place]  tablet 2   . oxycodone-acetaminophen (PERCOCET) 10-325 MG tablet Take 1 tablet by mouth every 8 hours as needed for Severe Pain (Pain Score 7-10). 90 tablet 0   . pantoprazole (PROTONIX) 40 MG tablet Take 1 tablet (40 mg) by mouth every morning (before breakfast). 90 tablet 3   . predniSONE (DELTASONE) 10 MG tablet Take 1 tablet (10 mg) by mouth daily. 90 tablet 1   . pregabalin (LYRICA) 75 MG capsule Take 1 capsule (75 mg) by mouth every morning AND 2 capsules (150 mg) every evening. 1 tablet AM and 2 tablets PM. 90 capsule 2   . sertraline (ZOLOFT) 25 MG tablet TAKE 1 TABLET BY MOUTH EVERY MORNING 90 tablet 1   . TECHLITE PEN NEEDLES 32G X 4 MM MISC USE ONE PEN NEEDLE WITH EACH INSULIN ADMINISTRATION. 200 each 11     No current facility-administered medications on file prior to visit.       Reviewed medications with the patient who is taking all of the above medications with the following exceptions: n  Side effects: n  Medication compliance: adequate    Review of Systems Update:  Constitutional - energy - low  Neuro - no headaches  MS - no tremors  GI - no nausea    PSYCHIATRIC SPECIALTY EXAMINATION       Psychometric Scales:  [place] PHQ9 DEPRESSION QUESTIONNAIRE 07/09/2020 07/09/2020 08/21/2020 11/21/2020 01/25/2021   Interest 3 2 1 3 2    Depressed 1 1 1 2 2    Sleep 1 1 -- 3 2   Energy 3 3 -- 3 3   Appetite 2 2 -- 3 2   Failure 0 0 -- 0 0   Concentration 2 1 -- 3 2   Movement 2 2 -- 0 0   Suicide -- 0 -- 0 0   Summary(Manual) -- -- -- -- --   Summary(Calculated) -- 12 -- 17 13   Functional Very difficult Somewhat difficult -- Somewhat difficult --      GAD 7 08/21/2020 11/21/2020 01/25/2021   1. Feeling nervous, anxious or on edge 0 0 2   2. Not being able to stop or control worrying 0 0 1   3. Worrying too much about different things - 0 1   4. Trouble relaxing - 0 2   5. Being so restless that it is hard to sit still - 0 0   6. Being easily  annoyed or irritable - 0 1   7. Feeling afraid as if something awful might happen - 0 0   GAD7 Patient Total - 0 7   If you checked off any problems, how difficult have these problems made it for you to do your job along with other people? - Not difficult at all Somewhat difficult      No flowsheet data found.    Pertinent Studies/Labs:   n    Mental Status Examination:   Appearance: Grooming casual   Behavior: Cooperative, has oxygen concentrate her  Motor/Abnormal Involuntary Movements: No abnormal or involuntary movements  Gait: steady gait  Speech: understandable/clear, coherent/meaningful, normal rate, and normal volume  Mood: Anxious and depressed   Affect: Appropriate to reported moods   Thought Process: Coherent, logical  Associations: goal-directed  Thought Content: No Current Suicidal or Homicidal Ideation  Perceptions: no abnl  Insight/Judgment: Adequate   Orientation: AAO x4 Memory: recent and remote memory grossly intact Attention/Concentration: no gross abnormalities Language: Average based on verbal fluency and interaction   [employer]  of knowledge and Intellect: Average based on interview and verbal fluency    MEDICAL DECISION MAKING     Assessment:  Kimberly Curtis is a 66 year old female with a history of episodic anxiety and depression who presents to clinic for a psychiatric follow up appointment. Since July of this year, with worsening cardiac symptoms on top of her chronic other health conditions, patient's level of anxiety, insomnia, and depression have increased to the point they are interfering with her functioning and the quality of her life. Fortunately no suicidal thoughts are reported    A comprehensive suicide risk assessment was performed and the patient was assessed to be at a low acute risk of self-harm.  Modifiable risk factors include precipitating stressors and severe medical illness.  Non-modifiable risk factors include existing psychiatric diagnoses and older age.  The patient also  has protective factors of future life plans, coping skills, responsibility to children, and access to health care    Diagnostic Impression/Status:   1. Generalized anxiety disorder    2. Major depression, recurrent    3. Insomnia      Plan  Medications:     sertraline  25 milligrams in the morning     Regarding insomnia,  mirtazapine 15 milligrams at bedtime p.r.n. insomnia     Risks, benefits, and side effects of medications are reviewed, and patient agrees to plan    Labs/Tests/Consultation: n  Referrals: n    Additional Planning:  - Provided psychoeducation, supportive therapy and empathic listening today.  - Discussed suicide and emergency room precautions.  - Discussed risks/benefits/alternatives to medication, patient voiced understanding.  - CURES review on 04/01/21 not indicated.  Other: n      --------------------------------------------------------    Psychotherapy Services Provided in addition to E/M Services for 04/01/2021    Therapy start time: 1400  Therapy stop time: 0254    Intervention: Psycho education and Supportive psychotherapy     Narrative of Therapeutic intervention:   Psycho-education provided on the symptoms and functional impairments associated with MDD as well as treatment options  Coping skill suggestions were also made t help he find was to manage her complex issues  Supportive therapy provided regarding her adherence and participation with her treatment plan    Target Symptoms:   Depression  Anxiety  Insomnia    Treatment Plan regarding Therapy:     Continue with psycho-education and supportive therapy to compliment her medication management    Progress in Treatment: fair    -------------------------------------------------------------    Disposition: Return visit 1 months    Olegario Messier, MD  Staff Psychiatrist  [facility]    Patient/surrogate has been evaluated today using telemedicine video visit, and has been informed of the right to refuse this type of visit, as well as the  alternatives including in-person evaluations.  Patient/surrogate has given consent to be evaluated and treated through video visit, documented in the [facility] record.

## 2021-04-02 ENCOUNTER — Other Ambulatory Visit (INDEPENDENT_AMBULATORY_CARE_PROVIDER_SITE_OTHER): Payer: No Typology Code available for payment source | Attending: Nurse Practitioner

## 2021-04-02 ENCOUNTER — Ambulatory Visit (INDEPENDENT_AMBULATORY_CARE_PROVIDER_SITE_OTHER): Payer: No Typology Code available for payment source | Admitting: Nurse Practitioner

## 2021-04-02 VITALS — BP 196/82 | HR 80 | Temp 97.5°F | Resp 16 | Ht 65.0 in | Wt 219.6 lb

## 2021-04-02 DIAGNOSIS — Z01818 Encounter for other preprocedural examination: Secondary | ICD-10-CM

## 2021-04-02 LAB — GLYCOSYLATED HGB(A1C), BLOOD: Glyco Hgb (A1C): 7.2 % — ABNORMAL HIGH (ref 4.8–5.8)

## 2021-04-02 NOTE — Patient Instructions (Addendum)
PREOPERATIVE SURGICAL INFORMATION     Your surgery is currently scheduled at [facility] on 04/15/21   The scheduler will be contacting you with the check in time  Go to lab now please       [facility], [place], [address] [place], [place] [address]  Please check in at Patient Services in [place] on 1st floor     [facility] (including [facility]): [employer] structure, Microbiologist structure, or Dance movement psychotherapist parking (7am-5pm at [employer]; 5am-5pm at [employer]) for same cost as self-parking in the front entrance of the [facility]   [email or web address]     QUESTIONS    If you have any questions between now and the day of your surgery, please do not hesitate to call:     [facility]: [phone]     DAY OF SURGERY ARRIVAL TIME:    On the day of your Surgery/Procedure, please arrive at the time provided by the surgeon's office. If you have any questions regarding your arrival time, please call the surgeon's office:     Preoperative Surgical Admissions at [facility]:      Medications to hold prior to surgery: Two days before surgery/procedure as you are undergoing a special bowel preparation  starting 1/21    Decrease glargine (Lantus) to 80% of the usual dose, to a temporary dose of 38 units starting the night before your bowel prep and again on the night before your surgery/procedure.  On the day before surgery/procedure as you are undergoing a special bowel preparation    Decrease glargine (Lantus) to 80% of the usual dose, to a temporary dose of 38 units on the night before your surgery/procedure.  If you have a low blood sugar on the day of your Bowel Preparation, take oral glucose (4 glucose tabs, or half a cup of clear juice or  clear soda).  On the day of surgery/procedure    Take your medications as normal    You may be given separate instructions that you should not eat or drink anything after midnight, the night before your procedure/surgery. If you have a blood sugar below 70 after the time you are no longer supposed to take anything by mouth, you may safely treat the low sugar with either glucagon (injection or intranasal), Glucogel, or one tablespoon of granulated sugar placed under the tongue or in the cheek area. Up until 4 hours before your surgery/procedure, you may treat your low blood sugar with 4 ounces (1/2 cup) of clear juice (no pulp), or 4 ounces (1/2 cup) of sugar containing clear soda (not a diet drink)        These are suggestions. You must take into account your current clinical condition. If you have any questions, please contact your primary care physician, endocrinologist or surgeon. If you have a medical emergency, please go to the nearest emergency room or call 911 for assistance.    on the day before surgery/procedure as you are undergoing a special bowel preparation    1. Check your glucose every 4 hours while you are awake.  2. Based on those glucose levels, give yourself Rapid - aspart (Novolog; Fiasp) Insulin starting in the morning.     Total Daily  Insulin Dose (MDI)  Glucose          If  You currently take more than a total of 30 Units/day  160-200      2 Units    201-250       3 Units  >250      4 Units    You may notice that these short acting insulin correction doses may be different from what you normally use. In the short time around surgery, it is important to avoid potentially dangerous low glucoses (and avoid high glucoses that may increase risk of infection.)  Thus a glucose range a bit higher than you may be used to  on a daily basis may be safer and more appropriate.  If you have a low blood sugar on the day of your Bowel Preparation, take oral glucose (4 glucose tabs, or half a cup of clear  juice or clear soda).  On the day of surgery/procedure    Take your medications as normal    You may be given separate instructions that you should not eat or drink anything after midnight, the night before your procedure/surgery. If you have a blood sugar below 70 after the time you are no longer supposed to take anything by mouth, you may safely treat the low sugar with either glucagon (injection or intranasal), Glucogel, or one tablespoon of granulated sugar placed under the tongue or in the cheek area. Up until 4 hours before your surgery/procedure, you may treat your low blood sugar with 4 ounces (1/2 cup) of clear juice (no pulp), or 4 ounces (1/2 cup) of sugar containing clear soda (not a diet drink)        These are suggestions. You must take into account your current clinical condition. If you have any questions, please contact your primary care physician, endocrinologist or surgeon. If you have a medical emergency, please go to the nearest emergency room or call 911 for assistance.      No losartan day of procedure      OK to take the following medications as scheduled with a small sip of water on the morning of surgery: continue all other medications      PLEASE HOLD ALL NSAIDS (non-steroidal anti-inflammatory drugs) SUCH AS advil, aleve, motrin, ibuprofen, relafen, lodine, feldene, Diclofenac, voltaren, indomethacin, naproxen, celebrex, Mobic 7 days before surgery.       Please hold vitamins, supplements, CO-Q10, Glucosamine, herbs & fish oil 7 days before surgery. Vitamin C, D are ok to take.    It is OK to take acetaminophen (Tylenol) for pain around the time of surgery unless you have liver disease.      AFTER YOUR VISIT WITH [place], IF YOU START TAKING A NEW MEDICATION BEFORE SURGERY, PLEASE CALL us TO MAKE SURE IT IS SAFE TO TAKE & WILL NOT AFFECT YOUR SURGERY.         OSA INSTRUCTIONS:     If you use a CPAP machine, please bring the entire CPAP machine, including mask and tubing, with you on the day of  surgery.    TO DO LIST:    Labs to be done prior to surgery: Please go to the LAB. Locations and hours can be found at [email or web address] Call before you go as some locations require appointments.           EATING/DRINKING        DO NOT EAT  ANYTHING AFTER MIDNIGHT ON THE DAY OF SURGERY  Follow bowel prep  Preparing for your Surgery:     Please wear clean loose-fitting clothes and leave valuables at home   Do not shave or remove body hair. Facial shaving is permitted. If you are having head surgery, ask your doctor whether you can shave.  Bring a picture ID and your insurance card, and be prepared to pay your deductible or co-insurance by cash, check, or credit card when you arrive.   If you are going home after your surgery, please make sure to arrange for an adult to drive you home. You CANNOT use UBER or LYFT. If you do not have a ride, your surgery may be cancelled.    Visitor policy during the MMHWK-08 pandemic is subject to change. Current visitor policy can be found at [email or web address]     On The Day of Your Surgery:      Check in at the location mentioned above   COVID testing may be done on arrival to the Pre-op or Procedural areas according to the current CDPH mandate.   If you are a woman of child bearing age, please note that you may be asked to give a urine sample upon check-in  You will meet your anesthesia and surgery teams in the preoperative holding area before surgery.   Once surgery is over, you will wake up in the recovery room.  If you go home, an adult chaperone will need to stay with you for the first 24 hours after surgery.   Visitor policy during the UPJSR-15 pandemic is subject to change. Current visitor policy can be found at [email or web address]    A video about what to expect for the day of surgery can be found here:    [email or web address]  Or by  searching "You-tube" for [place] before surgery and [place] after surgery     You medical records are available to you at [email or web address]

## 2021-04-03 ENCOUNTER — Other Ambulatory Visit (INDEPENDENT_AMBULATORY_CARE_PROVIDER_SITE_OTHER): Payer: Self-pay | Admitting: Student in an Organized Health Care Education/Training Program

## 2021-04-03 ENCOUNTER — Telehealth (INDEPENDENT_AMBULATORY_CARE_PROVIDER_SITE_OTHER): Payer: Self-pay | Admitting: Student in an Organized Health Care Education/Training Program

## 2021-04-03 ENCOUNTER — Encounter (INDEPENDENT_AMBULATORY_CARE_PROVIDER_SITE_OTHER): Payer: Self-pay | Admitting: Student in an Organized Health Care Education/Training Program

## 2021-04-03 DIAGNOSIS — G894 Chronic pain syndrome: Secondary | ICD-10-CM

## 2021-04-03 DIAGNOSIS — Z794 Long term (current) use of insulin: Secondary | ICD-10-CM

## 2021-04-03 DIAGNOSIS — E119 Type 2 diabetes mellitus without complications: Secondary | ICD-10-CM

## 2021-04-03 NOTE — Telephone Encounter (Signed)
General Inquiry     Who is calling: Incoming call from patient    Reason for this call: Patient calling in stating she was advised by insurance to have provider place referral for diabetic supplies and have order faxed to [facility]. Pt states rx was sent in October to pharmacy however it is not covered at pharmacy and needs for order to be expedited.   Please review and sign pended orders if appropriate.     Action required by office: Please process request     Duplicate encounter? No previous documentation found on this issue.     Best way to contact: [phone]  Alternative: na    Inquiry has been read verbatim to this caller. Verbalizes satisfaction and confirms the above is accurate: yes    Has been advised this message will be transmitted to office and can expect a response within the next 24-72 hours.    Encounter created by [facility] MA.  If further action required please route encounter to appropriate in clinic MA/LVN/Resident Pool

## 2021-04-03 NOTE — Telephone Encounter (Signed)
Order placed

## 2021-04-04 MED ORDER — PREGABALIN 75 MG OR CAPS
ORAL_CAPSULE | ORAL | 2 refills | Status: DC
Start: 2021-04-04 — End: 2021-04-16

## 2021-04-04 NOTE — Interdisciplinary (Signed)
A1C resulted drawn 04/02/21, notified and reviewed by NP Melrose Nakayama, D 04/04/21

## 2021-04-04 NOTE — Telephone Encounter (Signed)
[facility]       Controlled substances (Refill and PA requests) are not currently included in Pharmacy Refill Clinic protocol. Re-routing to appropriate staff for processing. Thank you.

## 2021-04-04 NOTE — Telephone Encounter (Signed)
[facility]       Controlled substances are not currently included in Pharmacy Refill Clinic protocol. Re-routing to appropriate staff for processing. Thank you.

## 2021-04-04 NOTE — Telephone Encounter (Signed)
[facility]       Controlled substances (Refill and PA requests) are not currently included in Pharmacy Refill Clinic protocol. Re-routing to appropriate staff for processing. Thank you.

## 2021-04-05 ENCOUNTER — Telehealth (INDEPENDENT_AMBULATORY_CARE_PROVIDER_SITE_OTHER): Payer: Self-pay

## 2021-04-05 NOTE — Telephone Encounter (Signed)
Patient called back to confirm upcoming procedure scheduled for colonoscopy and confirmed the following     Procedure date, time and location. yes  Transportation policy yes  Preparation instructions and that the patient has reviewed their procedure instruction letter and picked up their bowel prep (if applicable)  yes  Patient is having and EGD [place] a. No  Patient is aware of the current COVID protocols/testing guidelines yes  Patients procedure has been authorized yes      Are you currently experiencing any of the following symptoms (fever, new cough, shortness of breath or loss of taste and smell)?     a. No    The patient had no further questions at this time. Thank you.

## 2021-04-05 NOTE — Telephone Encounter (Signed)
From: Libby Sheckler  To: [name], MD  Sent: 04/03/2021 10:08 PM PST  Subject: Dexcom 6    I'm sorry to keep bothering you but I have not received my CGM yet. I need you guys to fax over my last 2 doctor visits chart notes to this fax number [phone] in order to get the application started again can you please do this for me right away please thank you

## 2021-04-05 NOTE — Telephone Encounter (Signed)
Pre-procedure call made. Left a message for the patient to call back to confirm their procedure check in time, procedure date, and location. Also to remind the patient that they need transportation post procedure, pick up their bowel prep (if applicable), and confirm our current COVID protocols/testing guidelines. MyChart message sent/procedure authorized.      When the patient calls back please ask the COVID screening questions and document. Thank you.

## 2021-04-11 NOTE — Interdisciplinary (Addendum)
Received records from Dr Alger Simons, A ( cardiac notes, tests ) scanned in media, notified and reviewed by Lelan Pons, D 04/11/21

## 2021-04-14 NOTE — H&P (Signed)
History and Physical    Indication for procedure:      66 y/o female, referring [name], MD.  PMH includes, pain syndrome, htn, anxiety, pace maker, fibromyalgia.  1.23.2023 - Screening/surveillance colonoscopy.               Past Medical History:   Diagnosis Date   . Diabetes mellitus (CMS-HCC)    . Hypertension    . Sarcoidosis of lung (CMS-HCC)     bilat     Past Surgical History:   Procedure Laterality Date   . CHOLECYSTECTOMY     . HYSTERECTOMY     . KNEE ARTHROSCOPY Left      Allergies   Allergen Reactions   . Sulfa Drugs Anaphylaxis   . Benadryl [Diphenhydramine] Rash and Itching     Per 12/25/20 TE: Do you have any additional known allergies to over the counter or prescription medications? Yes   If yes, please list additional medication(s) dye codeine on benadryl  and explain reaction(s)itching, rash    . Latex Itching     Per 12/25/20 TE: Are you allergic to latex? Yes   If yes, please explain reaction(s)itchiness    . Morphine Itching     Prior to Admission Medications   Prescriptions Last Dose Informant Patient Reported? Taking?   ASPIRIN LOW DOSE 81 MG EC tablet   No No   Sig: TAKE 1 TABLET BY MOUTH EVERY DAY   Albuterol Sulfate 108 (90 Base) MCG/ACT AEPB   Yes No   Sig: Inhale 1 puff by mouth as needed.   Continuous Blood Gluc Receiver (CONTINUOUS BLOOD GLUCOSE, DEXCOM G6, RECEIVER)   No No   Sig: Use daily to read glucose topically   Continuous Blood Gluc Sensor (CONTINUOUS BLOOD GLUCOSE, DEXCOM G6, SENSOR)   No No   Sig: Insert subcutaneously every 10 days.   Continuous Blood Gluc Transmit (CONTINUOUS BLOOD GLUCOSE, DEXCOM G6, TRANSMITTER)   No No   Sig: Use daily to transmit subcutaneous glucose value. Change every 3 months.   Hydrocod Polst-CPM Polst ER 10-8 MG/5ML SUER   No No   Sig: Take 5 mL by mouth every 12 hours as needed (cough).   NOVOLOG FLEXPEN 100 UNIT/ML injection pen   No No   Sig: INJECT 20 UNITS UNDER THE SKIN 3 TIMES DAILY AS NEEDED FOR HIGH BLOOD SUGAR. IN THE MORNING 20  UNITS + SLIDING SCALE MID DAY: 17 + SLIDING SCALE AND IN THE EVENING 12 + SLIDING SCALE   TECHLITE PEN NEEDLES 32G X 4 MM MISC   No No   Sig: USE ONE PEN NEEDLE WITH EACH INSULIN ADMINISTRATION.   albuterol (PROVENTIL) (2.5 MG/3ML) 0.083% nebulization   No No   Sig: 3 mL (2.5 mg) by Nebulization route every 4 hours as needed for Wheezing.   albuterol 108 (90 Base) MCG/ACT inhaler   No No   Sig: Inhale 2 puffs by mouth every 6 hours as needed for Wheezing.   atorvastatin (LIPITOR) 40 MG tablet   No No   Sig: Take 1 tablet (40 mg) by mouth daily.   famotidine (PEPCID) 40 MG tablet   No No   Sig: TAKE 1 TABLET BY MOUTH EVERY DAY   fexofenadine (ALLEGRA) 180 MG tablet   No No   Sig: Take 1 tablet (180 mg) by mouth daily.   fluticasone-umeclidinium-vilanterol (TRELEGY ELLIPTA) 100-62.5-25 MCG/INH AEPB   Yes No   Sig: Inhale 1 puff by mouth daily.   folic acid (FOLVITE)  1 MG tablet   No No   Sig: TAKE 1 TABLET BY MOUTH EVERY DAY   hydrOXYzine HCL (ATARAX) 50 MG tablet   No No   Sig: Take 1 tablet (50 mg) by mouth 2 times daily as needed for Itching.   insulin glargine (LANTUS SOLOSTAR) 100 units/mL injection pen   No No   Sig: Inject 48 Units under the skin nightly.   losartan (COZAAR) 50 MG tablet   No No   Sig: Take 1 tablet (50 mg) by mouth daily.   metoprolol tartrate (LOPRESSOR) 50 MG tablet   No No   Sig: Take 1 tablet (50 mg) by mouth 2 times daily.   mirtazapine (REMERON) 30 MG tablet   No No   Sig: Take 1 tablet (30 mg) by mouth nightly.   montelukast (SINGULAIR) 10 MG tablet   No No   Sig: Take 1 tablet (10 mg) by mouth every evening.   mupirocin (BACTROBAN) 2 % ointment   No No   Sig: APPLY 1 APPLICATION TOPICALLY 2 TIMES DAILY. USE A SMALL AMOUNT AS DIRECTED   naloxone (NARCAN) 4 mg/0.1 mL nasal spray   Yes No   Sig: Spray 1 spray into one nostril once as needed.   ondansetron (ZOFRAN ODT) 8 MG disintegrating tablet   No No   Sig: TAKE 1 TABLET ON OR UNDER THE TONGUE EVERY 8 HOURS AS NEEDED FOR  NAUSEA/VOMITING.   oxycodone-acetaminophen (PERCOCET) 10-325 MG tablet   No No   Sig: Take 1 tablet by mouth every 8 hours as needed for Severe Pain (Pain Score 7-10).   pantoprazole (PROTONIX) 40 MG tablet   No No   Sig: Take 1 tablet (40 mg) by mouth every morning (before breakfast).   predniSONE (DELTASONE) 10 MG tablet   No No   Sig: Take 1 tablet (10 mg) by mouth daily.   pregabalin (LYRICA) 75 MG capsule   No No   Sig: TAKE 1 CAPSULE BY MOUTH EVERY MORNING AND 2 CAPSULES EVERY EVENING   sertraline (ZOLOFT) 100 MG tablet   No No   Sig: Take 1/2 tablet in the morning, then increase to 1 tablet therafter      Facility-Administered Medications: None       There were no vitals taken for this visit.  General: Well developed, well nourished, in no apparent distress.  Lungs: Clear breath sounds bilaterally.  CV: Normal rate, regular rhythm, no significant murmur present.  Abdomen: Soft, nontender, normal bowel sounds present.    ASA Score:  2   Airway (Mallimpati) Score:  Class II - Soft palate, uvula, and fauces are visible.    Assessment and Plan  Proceed to planned procedure.    The patient has consented to the procedure, which will be done with sedation.  I have assessed the patient's status immediately prior to this procedure.  I have discussed pain management needs and options for the patient with the patient or caregiver.      The patient agrees to be full code for the duration of the procedure.    Sedation options, risks, and plans have been discussed with the patient or caregiver.  Questions were answered.  The patient or caregiver agrees to proceed as planned.    [place]

## 2021-04-15 ENCOUNTER — Ambulatory Visit (HOSPITAL_BASED_OUTPATIENT_CLINIC_OR_DEPARTMENT_OTHER): Payer: No Typology Code available for payment source | Admitting: Nurse Practitioner

## 2021-04-15 ENCOUNTER — Encounter (HOSPITAL_BASED_OUTPATIENT_CLINIC_OR_DEPARTMENT_OTHER): Payer: Self-pay | Admitting: Gastroenterology

## 2021-04-15 ENCOUNTER — Ambulatory Visit
Admission: RE | Admit: 2021-04-15 | Discharge: 2021-04-15 | Disposition: A | Discharge status: Home / Self Care | Payer: No Typology Code available for payment source | Attending: Gastroenterology | Admitting: Gastroenterology

## 2021-04-15 ENCOUNTER — Encounter (HOSPITAL_BASED_OUTPATIENT_CLINIC_OR_DEPARTMENT_OTHER): Admission: RE | Disposition: A | Discharge status: Home Health | Payer: Self-pay | Attending: Gastroenterology

## 2021-04-15 DIAGNOSIS — Z1211 Encounter for screening for malignant neoplasm of colon: Secondary | ICD-10-CM

## 2021-04-15 DIAGNOSIS — E669 Obesity, unspecified: Secondary | ICD-10-CM | POA: Insufficient documentation

## 2021-04-15 DIAGNOSIS — Z87891 Personal history of nicotine dependence: Secondary | ICD-10-CM | POA: Insufficient documentation

## 2021-04-15 DIAGNOSIS — G894 Chronic pain syndrome: Secondary | ICD-10-CM | POA: Insufficient documentation

## 2021-04-15 DIAGNOSIS — Z7982 Long term (current) use of aspirin: Secondary | ICD-10-CM | POA: Insufficient documentation

## 2021-04-15 DIAGNOSIS — Z79891 Long term (current) use of opiate analgesic: Secondary | ICD-10-CM | POA: Insufficient documentation

## 2021-04-15 DIAGNOSIS — K649 Unspecified hemorrhoids: Secondary | ICD-10-CM | POA: Insufficient documentation

## 2021-04-15 DIAGNOSIS — E1122 Type 2 diabetes mellitus with diabetic chronic kidney disease: Secondary | ICD-10-CM | POA: Insufficient documentation

## 2021-04-15 DIAGNOSIS — Z9049 Acquired absence of other specified parts of digestive tract: Secondary | ICD-10-CM | POA: Insufficient documentation

## 2021-04-15 DIAGNOSIS — K5730 Diverticulosis of large intestine without perforation or abscess without bleeding: Secondary | ICD-10-CM | POA: Insufficient documentation

## 2021-04-15 DIAGNOSIS — D124 Benign neoplasm of descending colon: Secondary | ICD-10-CM | POA: Insufficient documentation

## 2021-04-15 DIAGNOSIS — R197 Diarrhea, unspecified: Secondary | ICD-10-CM | POA: Insufficient documentation

## 2021-04-15 DIAGNOSIS — Z79899 Other long term (current) drug therapy: Secondary | ICD-10-CM | POA: Insufficient documentation

## 2021-04-15 DIAGNOSIS — D126 Benign neoplasm of colon, unspecified: Secondary | ICD-10-CM

## 2021-04-15 DIAGNOSIS — K219 Gastro-esophageal reflux disease without esophagitis: Secondary | ICD-10-CM | POA: Insufficient documentation

## 2021-04-15 DIAGNOSIS — Z882 Allergy status to sulfonamides status: Secondary | ICD-10-CM | POA: Insufficient documentation

## 2021-04-15 DIAGNOSIS — I10 Essential (primary) hypertension: Secondary | ICD-10-CM

## 2021-04-15 DIAGNOSIS — F419 Anxiety disorder, unspecified: Secondary | ICD-10-CM | POA: Insufficient documentation

## 2021-04-15 DIAGNOSIS — Z91040 Latex allergy status: Secondary | ICD-10-CM | POA: Insufficient documentation

## 2021-04-15 DIAGNOSIS — Z90710 Acquired absence of both cervix and uterus: Secondary | ICD-10-CM | POA: Insufficient documentation

## 2021-04-15 DIAGNOSIS — Z794 Long term (current) use of insulin: Secondary | ICD-10-CM | POA: Insufficient documentation

## 2021-04-15 DIAGNOSIS — I129 Hypertensive chronic kidney disease with stage 1 through stage 4 chronic kidney disease, or unspecified chronic kidney disease: Secondary | ICD-10-CM | POA: Insufficient documentation

## 2021-04-15 DIAGNOSIS — Z888 Allergy status to other drugs, medicaments and biological substances status: Secondary | ICD-10-CM | POA: Insufficient documentation

## 2021-04-15 DIAGNOSIS — N183 Chronic kidney disease, stage 3 unspecified (CMS-HCC): Secondary | ICD-10-CM | POA: Insufficient documentation

## 2021-04-15 DIAGNOSIS — Z885 Allergy status to narcotic agent status: Secondary | ICD-10-CM | POA: Insufficient documentation

## 2021-04-15 DIAGNOSIS — M797 Fibromyalgia: Secondary | ICD-10-CM | POA: Insufficient documentation

## 2021-04-15 DIAGNOSIS — I447 Left bundle-branch block, unspecified: Secondary | ICD-10-CM | POA: Insufficient documentation

## 2021-04-15 DIAGNOSIS — G4733 Obstructive sleep apnea (adult) (pediatric): Secondary | ICD-10-CM | POA: Insufficient documentation

## 2021-04-15 DIAGNOSIS — D860 Sarcoidosis of lung: Secondary | ICD-10-CM | POA: Insufficient documentation

## 2021-04-15 DIAGNOSIS — Z950 Presence of cardiac pacemaker: Secondary | ICD-10-CM | POA: Insufficient documentation

## 2021-04-15 DIAGNOSIS — I1310 Hypertensive heart and chronic kidney disease without heart failure, with stage 1 through stage 4 chronic kidney disease, or unspecified chronic kidney disease: Secondary | ICD-10-CM | POA: Insufficient documentation

## 2021-04-15 DIAGNOSIS — Z7952 Long term (current) use of systemic steroids: Secondary | ICD-10-CM | POA: Insufficient documentation

## 2021-04-15 DIAGNOSIS — E119 Type 2 diabetes mellitus without complications: Secondary | ICD-10-CM | POA: Insufficient documentation

## 2021-04-15 LAB — GLUCOSE (POCT)
Glucose (POCT): 167 mg/dL — ABNORMAL HIGH (ref 70–99)
Glucose (POCT): 141 mg/dL — ABNORMAL HIGH (ref 70–99)

## 2021-04-15 SURGERY — COLONOSCOPY
Anesthesia: Monitored Anesthesia Care (MAC)

## 2021-04-15 MED ORDER — LIDOCAINE HCL 2 % IJ SOLN WRAPPED RECORD
INTRAMUSCULAR | Status: DC | PRN
Start: 2021-04-15 — End: 2021-04-15
  Administered 2021-04-15 (×2): 50 mg via INTRAVENOUS

## 2021-04-15 MED ORDER — PROPOFOL 200 MG/20ML IV EMUL
INTRAVENOUS | Status: DC | PRN
Start: 2021-04-15 — End: 2021-04-15
  Administered 2021-04-15 (×4): 50 mg via INTRAVENOUS

## 2021-04-15 MED ORDER — PROPOFOL 1000 MG/100ML IV EMUL
INTRAVENOUS | Status: DC | PRN
Start: 2021-04-15 — End: 2021-04-15
  Administered 2021-04-15 (×2): 100 ug/kg/min via INTRAVENOUS
  Administered 2021-04-15: 50 ug/kg/min via INTRAVENOUS
  Administered 2021-04-15 (×3): 150 ug/kg/min via INTRAVENOUS
  Administered 2021-04-15: 125 ug/kg/min via INTRAVENOUS

## 2021-04-15 MED ORDER — FENTANYL CITRATE (PF) 50 MCG/ML IJ SOLN (WRAPPED RECORD) ~~LOC~~
25.0000 ug | INTRAMUSCULAR | Status: DC | PRN
Start: 2021-04-15 — End: 2021-04-15
  Filled 2021-04-15: qty 2

## 2021-04-15 MED ORDER — ONDANSETRON HCL 4 MG/2ML IV SOLN
4.0000 mg | Freq: Once | INTRAMUSCULAR | Status: DC | PRN
Start: 2021-04-15 — End: 2021-04-15
  Filled 2021-04-15: qty 2

## 2021-04-15 MED ORDER — FENTANYL CITRATE (PF) 50 MCG/ML IJ SOLN (WRAPPED RECORD) ~~LOC~~
50.0000 ug | INTRAMUSCULAR | Status: DC | PRN
Start: 2021-04-15 — End: 2021-04-15
  Administered 2021-04-15 (×2): 50 ug via INTRAVENOUS
  Filled 2021-04-15: qty 2

## 2021-04-15 MED ORDER — HYDROMORPHONE HCL 1 MG/ML IJ SOLN
0.5000 mg | INTRAMUSCULAR | Status: DC | PRN
Start: 2021-04-15 — End: 2021-04-15

## 2021-04-15 MED ORDER — OXYCODONE HCL 5 MG OR TABS
5.0000 mg | ORAL_TABLET | Freq: Once | ORAL | Status: DC | PRN
Start: 2021-04-15 — End: 2021-04-15
  Administered 2021-04-15 (×2): 5 mg via ORAL

## 2021-04-15 MED ORDER — PROPOFOL 200 MG/20ML IV EMUL
INTRAVENOUS | Status: DC | PRN
Start: 2021-04-15 — End: 2021-04-15

## 2021-04-15 MED ORDER — NALOXONE HCL 0.4 MG/ML IJ SOLN
0.1000 mg | INTRAMUSCULAR | Status: DC | PRN
Start: 2021-04-15 — End: 2021-04-15
  Filled 2021-04-15: qty 0.25

## 2021-04-15 MED ORDER — FENTANYL CITRATE (PF) 100 MCG/2ML IJ SOLN
INTRAMUSCULAR | Status: DC
Start: 2021-04-15 — End: 2021-04-15
  Filled 2021-04-15: qty 2

## 2021-04-15 MED ORDER — LACTATED RINGERS IV SOLN
INTRAVENOUS | Status: DC
Start: 2021-04-15 — End: 2021-04-15

## 2021-04-15 MED ORDER — OXYCODONE HCL 5 MG OR TABS
ORAL_TABLET | ORAL | Status: DC
Start: 2021-04-15 — End: 2021-04-15
  Filled 2021-04-15: qty 1

## 2021-04-15 MED ORDER — LACTATED RINGERS IV SOLN
INTRAVENOUS | Status: DC | PRN
Start: 2021-04-15 — End: 2021-04-15

## 2021-04-15 SURGICAL SUPPLY — 4 items
BIOPSY FORCEP RADIAL JAW 4 2.8MM X 240CM, LARGE W/NEEDLE (Needles/punch/cannula/biopsy) ×2 IMPLANT
SNARE COLD CAPTIVATOR 10MM ROUNDED STIFF (Misc Surgical Supply) ×1 IMPLANT
SNARE COLD CAPTIVATOR 10MM ROUNDED STIFF 10/BX (Misc Surgical Supply) ×2
TRAP POLYP E TRAP (Misc Medical Supply) ×2 IMPLANT

## 2021-04-15 NOTE — Anesthesia Postprocedure Evaluation (Signed)
Anesthesia Post Note    Patient: Kimberly Curtis    Procedure(s) Performed: Procedure(s):  GI COLONOSCOPY (open access)      Final anesthesia type: Monitored Anesthesia Care    Patient location: PACU    Post anesthesia pain: adequate analgesia    Mental status: awake, alert  and oriented    Airway Patent: Yes    Last Vitals:   Vitals Value Taken Time   BP 128/76 04/15/21 1630   Temp 36.4 C 04/15/21 1522   Pulse 80 04/15/21 1630   Resp 15 04/15/21 1630   SpO2 95 % 04/15/21 1630        Post vital signs: stable    Hydration: adequate    N/V:no    Anesthetic complications: no    Plan of care per primary team.

## 2021-04-15 NOTE — Procedures (Signed)
[facility]  Gastroenterology/Special Procedures    Patient Name: Kimberly Curtis  Date of Birth: [birth year]  Record Number: [id number]  Date of Procedure: 04/15/2021  Referring Physician:   Endoscopist: Lita Mains   Asst. Endoscopist:     Nurse: Franne Forts    PROCEDURE PERFORMED  Colonoscopy plus snare polypectomy plus forceps biopsies    INDICATIONS FOR EXAMINATION   66 y/o female, referring Kimberly Balint, MD.  PMH includes, pain syndrome, htn, anxiety, pace maker, fibromyalgia.  1.23.2023 - Screening/surveillance colonoscopy.     Instruments:    Medications: Monitored anesthesia care                  The attending physician, Dr. Rupert Stacks, was present for the entire examination.  Procedure Technique: Patient's medications, allergies, past medical, surgical, social and family histories were reviewed and updated as appropriate. A discussion of informed consent was had with the patient and/or the patient's family prior to the   procedure, including sedation.  The alternatives, benefits and risks of the procedure including but not limited to pain, perforation, hemorrhage, infection, adverse drug reaction, missed lesion/incomplete procedure, and aspiration were discussed    Description of Procedure:  Based on the pre-procedure assessment, including review of the patient's medical history, medications, allergies, and review of systems, the patient had been deemed to be an appropriate candidate for sedation; the patient was therefore sedated with the   medications listed. The patient was monitored continuously with pulse oximetry, blood pressure monitoring, and direct observations.  After digital rectal examination, the colonoscope  was inserted into the rectum and advanced under direct vision to the level of the cecum.  The quality of the colonic preparation was Good.  A careful inspection was made as the colonoscope was withdrawn.    Findings and interventions are described below.  Extent of Exam:  cecum.  Biopsy Obtained: Yes.  Complications: .   Estimated Blood Loss: Less than 75mL.   Need for Future Anesthesia: Anesthesiology for future procedures.  Cecal Withdrawal Time:  Total Procedure Time:     FINDINGS  See below    ENDOSCOPIC DIAGNOSIS  Colonoscope was advanced through the entire colon twice.    Random colon biopsies taken given reported history of diarrhea.    5 mm polyp removed with snare polypectomy from the transverse colon.    Hemorrhoids on retroflexion.    Left-sided diverticulosis.    RECOMMENDATIONS  Follow up pathology results.    Repeat colonoscopy likely in 7 years if adenomas.    Good news that no major concerning findings on colonoscopy today.  If diarrhea recurs consider stool tests.    Signature:_________________________________ Lita Mains, M.D.            [phone])    Note:  The final official report is in the [facility] medical record.      This electronic signature authenticates all electronic and/or handwritten documentation, including orders, generated by the signer during the episode of care contained in this record.  04/15/2021 03:36:28 PM By Lucilla Edin.D.

## 2021-04-16 ENCOUNTER — Encounter (INDEPENDENT_AMBULATORY_CARE_PROVIDER_SITE_OTHER): Payer: Self-pay

## 2021-04-16 ENCOUNTER — Ambulatory Visit (INDEPENDENT_AMBULATORY_CARE_PROVIDER_SITE_OTHER)
Payer: No Typology Code available for payment source | Admitting: Student in an Organized Health Care Education/Training Program

## 2021-04-16 VITALS — BP 142/74 | HR 98 | Temp 97.4°F | Resp 16 | Ht 65.0 in | Wt 213.0 lb

## 2021-04-16 DIAGNOSIS — R350 Frequency of micturition: Secondary | ICD-10-CM

## 2021-04-16 DIAGNOSIS — G8929 Other chronic pain: Secondary | ICD-10-CM

## 2021-04-16 DIAGNOSIS — E1143 Type 2 diabetes mellitus with diabetic autonomic (poly)neuropathy: Secondary | ICD-10-CM

## 2021-04-16 DIAGNOSIS — G894 Chronic pain syndrome: Secondary | ICD-10-CM

## 2021-04-16 DIAGNOSIS — I10 Essential (primary) hypertension: Secondary | ICD-10-CM

## 2021-04-16 DIAGNOSIS — M25551 Pain in right hip: Secondary | ICD-10-CM

## 2021-04-16 DIAGNOSIS — Z794 Long term (current) use of insulin: Secondary | ICD-10-CM

## 2021-04-16 DIAGNOSIS — Z1231 Encounter for screening mammogram for malignant neoplasm of breast: Secondary | ICD-10-CM

## 2021-04-16 DIAGNOSIS — Z950 Presence of cardiac pacemaker: Secondary | ICD-10-CM

## 2021-04-16 LAB — UA, CHEM ONLY POCT
Bilirubin: NEGATIVE
Blood: NEGATIVE
Glucose: NEGATIVE
Ketones: NEGATIVE
Leuk Esterase: NEGATIVE
Nitrite: NEGATIVE
Specific Gravity: >=1.03 (ref 1.002–1.030)
Urobilinogen: 0.2 (ref 0.2–1.0)
pH: 5.5 (ref 5.0–8.0)

## 2021-04-16 MED ORDER — OXYCODONE-ACETAMINOPHEN 10-325 MG OR TABS
1.0000 | ORAL_TABLET | Freq: Three times a day (TID) | ORAL | 0 refills | Status: DC | PRN
Start: 2021-04-16 — End: 2021-04-17

## 2021-04-16 MED ORDER — PREGABALIN 75 MG OR CAPS
ORAL_CAPSULE | ORAL | 3 refills | Status: DC
Start: 2021-05-04 — End: 2021-07-12

## 2021-04-16 NOTE — Assessment & Plan Note (Addendum)
Chronic pain, OA of knees/back, sarcoid pain  On Lyrica 75/150, Percocet 10 PRN, Tylenol regular.   Previous meds: Gabapentin,Tramadol, Baclofen, and Amitryptaline   Has Narcan at home, never used.   Previously seeing pain specialist in [place].   Pending Pain eval at [place] (referral placed 07/09/2020 - last appt cancelled per patient)  -Voltaren/Lidocaine PRN  -Tylenol PRN  -Cont Lyrica 75/150  -Percocet 10 for SEVERE PAIN, refilled (has been using half tablet as needed). Discussed this will be temporary until Pain eval, and not a long term treatment  -Cont adjunct pain management, PT  -Meds reviewed and reconciled in office today  -CURES reviewed   -Advised to schedule with Pain medicine    I ran the patient's name and date-of-birth through the [facility] Department of [place] website which will list controlled drug prescriptions filled at [facility] in the past 12 months. The information in the patient's CURES report is consistent with their self-report and shows no evidence of controlled substance abuse or doctor shopping.

## 2021-04-16 NOTE — Progress Notes (Signed)
Internal Medicine Clinic Progress Note       Chief Complaint   Patient presents with    Follow Up     Follow -up; No new issues or concerns       Subjective:      Kimberly Curtis is a 66 year old female here for follow-up.    Presents with Husband [name] today    Pain follow-up  Has not scheduled Endo/Pain medicine appts yet    Had colonoscopy yesterday  Colonoscope was advanced through the entire colon twice.    Random colon biopsies taken given reported history of diarrhea.    5 mm polyp removed with snare polypectomy from the transverse colon.    Hemorrhoids on retroflexion.    Left-sided diverticulosis.     Today report soreness, diffuse pain which is chronic  Has not followed up with Pain medicine yet  Interested in physical therapy     History:  Sarcoidosisc/b frequent PNA/bronchitis s/p bronchial stent-previously onchronic steroids,tapered offPrednisone 10 mg,Albuterol neb PRN, MDI uses more frequently, montelukast 10 mg daily, Trelegy daily. Had bronchial stents recently taken out on 04/30/2020 in [place]. [facility] CT [facility].On 3L 02continuous  Chronic coughcausingB/l abd/thoracic pain-intermittent. Previously prescribed Tussionex  T2DMw/peripheralneuropathies-last A1c7.2%(03/2021), UACR UTC(06/2020). On Lantus 48, Novolog 20-17-12TID plusISS,(stoppedJardiance 25 mg dailydue to yeast infections, stopped glipizide, metformin cause GI issues). Last eye exam 03/27/2021. Endo/CDE referral(placed 06/2020).  Hx ofVFIB/TACH/prolonged QTC-s/p DC AICD placement 7/16[facility]7/20/22 at [facility], following with Cardiology at [facility]  HTN-onMetoprolol 25 BID, Losartan 50 daily. Checks BP at home, 130/70s and exacerbated with underlying pain   HLD-onLipitor 40 per  GERD-Protonix 40 mg daily, Pepcid 40 daily. Pending GI f/u (placed 08/2020 - has not scheduled)  Nausea, GI upset, chronic-pending gastric emptying study, stopped Reglan due to QTC. Takes Zofran  PRN  Diverticulosis-colonoscopy done ~2019  Obesity, BMI36  Chronic pain, OA of knees/back/hands/knees, sarcoid pain-on Lyrica 75/150, Percocet 10 PRN (takes 0.5 as needed for breakthrough). StoppedGabapentin,Tramadol, Baclofen, and Amitryptaline due to qtc. Has Narcan at home, never used. Previously seeing pain specialist in [place]. Pending Pain eval at [place]  Anxiety/MDD/insomnia-following with Psychiatry. Started on Sertraline 25 and mirtazapine 15  Allergies-Allegra 180 daily  Itching-Hydroxyzine 50 mg PRN(not using)  S/p hysterectomy-right ovary intact, done in 1996  S/p L-knee surgery-2003, torn meniscus, ACL repair     Social Hx:  Retired, previous [employer] disability. Has 4 children. Stopped tobacco in 1996 (10 pack year history). Denies alcohol use.    Assessment and Plan:     Problem List Items Addressed This Visit        Cardiovascular    Essential hypertension    Current Assessment & Plan     BP elevated in clinic and likely due to pain. Goal <130/80  OnMetoprolol 25 BID, Losartan 50 daily.   Checks BP at home, 130/70s and exacerbated with underlying pain   -cont current regimen  -pain management  -diet/exercise         Relevant Orders    Consult/Referral to Cardiology Clinic       Endocrine    Type 2 diabetes mellitus with diabetic autonomic neuropathy, with long-term current use of insulin (CMS-HCC) - Primary    Current Assessment & Plan     Stable. Last A1c7.2%(03/2021), UACR UTC(06/2020). On Lantus 48, Novolog 20-17-12TID plusISS,(stoppedJardiance 25 mg dailydue to yeast infections, stopped glipizide, metformin cause GI issues). Last eye exam 03/27/2021. Endo/CDE referral(placed 06/2020).  Average 130-160, had episode of  low BS 47 (11/28/20) and sent to ED  -Discussed importance of BG management   -Continue current regimen  -Can consider adding Januvia at next visit, denies pancreatitis  -Cont ARB/statin  -ENDO/CDE referral in   -Continue annual eye exams, referral placed  -F/u as  planned            Other    Chronic pain syndrome    Current Assessment & Plan     Chronic pain, OA of knees/back, sarcoid pain  On Lyrica 75/150, Percocet 10 PRN, Tylenol regular.   Previous meds: Gabapentin,Tramadol, Baclofen, and Amitryptaline   Has Narcan at home, never used.   Previously seeing pain specialist in [place].   Pending Pain eval at [place] (referral placed 07/09/2020 - last appt cancelled per patient)  -Voltaren/Lidocaine PRN  -Tylenol PRN  -Cont Lyrica 75/150  -Percocet 10 for SEVERE PAIN, refilled (has been using half tablet as needed). Discussed this will be temporary until Pain eval, and not a long term treatment  -Cont adjunct pain management, PT  -Meds reviewed and reconciled in office today  -CURES reviewed   -Advised to schedule with Pain medicine    I ran the patient's name and date-of-birth through the [facility] Department of [place] website which will list controlled drug prescriptions filled at [facility] in the past 12 months. The information in the patient's CURES report is consistent with their self-report and shows no evidence of controlled substance abuse or doctor shopping.         Relevant Medications    oxycodone-acetaminophen (PERCOCET) 10-325 MG tablet    pregabalin (LYRICA) 75 MG capsule (Start on 05/04/2021)    Other Relevant Orders    Consult to [facility] - Internal    Chronic pain of both knees    Relevant Orders    Consult to [facility] - Internal    S/P placement of cardiac pacemaker    Relevant Orders    Consult/Referral to Cardiology Clinic   Other Visit Diagnoses     Encounter for screening mammogram for breast cancer        Relevant Orders    Screening Mammogram With Digital Breast Tomosynthesis - Bilateral    Bilateral hip pain        Relevant Orders    Consult to [facility] - Internal    Urinary frequency        Increase frequency lately, no pain or discharge. No systemic sx, feels  more back pain. Check UA today.    Relevant Orders    UA, Chem Only (POCT)          Medication Review:  Medications reviewed with patient and medication list reconciled.  Over the counter medications, herbal therapies and supplements reviewed.  Patient's understanding and response to medications assessed.   Barriers to medications assessed and addressed.   Risks, benefits, alternatives to medications reviewed.  No barriers to learning, verbalizes understanding of teaching and instructions.    Medications at the end of this encounter:  Current Outpatient Medications   Medication Sig Dispense Refill    albuterol (PROVENTIL) (2.5 MG/3ML) 0.083% nebulization 3 mL (2.5 mg) by Nebulization route every 4 hours as needed for Wheezing. 1 each 2    albuterol 108 (90 Base) MCG/ACT inhaler Inhale 2 puffs by mouth every 6 hours as needed for Wheezing. 1 each 0    Albuterol Sulfate 108 (90 Base) MCG/ACT AEPB Inhale 1 puff by mouth as  needed.      ASPIRIN LOW DOSE 81 MG EC tablet TAKE 1 TABLET BY MOUTH EVERY DAY 120 tablet 1    atorvastatin (LIPITOR) 40 MG tablet Take 1 tablet (40 mg) by mouth daily. 90 tablet 3    Continuous Blood Gluc Receiver (CONTINUOUS BLOOD GLUCOSE, DEXCOM G6, RECEIVER) Use daily to read glucose topically 1 each 0    Continuous Blood Gluc Sensor (CONTINUOUS BLOOD GLUCOSE, DEXCOM G6, SENSOR) Insert subcutaneously every 10 days. 10 each 4    Continuous Blood Gluc Transmit (CONTINUOUS BLOOD GLUCOSE, DEXCOM G6, TRANSMITTER) Use daily to transmit subcutaneous glucose value. Change every 3 months. 1 each 4    famotidine (PEPCID) 40 MG tablet TAKE 1 TABLET BY MOUTH EVERY DAY 90 tablet 1    fexofenadine (ALLEGRA) 180 MG tablet Take 1 tablet (180 mg) by mouth daily. 90 tablet 1    fluticasone-umeclidinium-vilanterol (TRELEGY ELLIPTA) 100-62.5-25 MCG/INH AEPB Inhale 1 puff by mouth daily.      folic acid (FOLVITE) 1 MG tablet TAKE 1 TABLET BY MOUTH EVERY DAY 90 tablet 1    Hydrocod Polst-CPM Polst ER 10-8  MG/5ML SUER Take 5 mL by mouth every 12 hours as needed (cough). 70 mL 0    hydrOXYzine HCL (ATARAX) 50 MG tablet Take 1 tablet (50 mg) by mouth 2 times daily as needed for Itching. 180 tablet 3    insulin glargine (LANTUS SOLOSTAR) 100 units/mL injection pen Inject 48 Units under the skin nightly. 15 mL 2    losartan (COZAAR) 50 MG tablet Take 1 tablet (50 mg) by mouth daily. 90 tablet 1    metoprolol tartrate (LOPRESSOR) 50 MG tablet Take 1 tablet (50 mg) by mouth 2 times daily. 180 tablet 1    mirtazapine (REMERON) 30 MG tablet Take 1 tablet (30 mg) by mouth nightly. 30 tablet 1    montelukast (SINGULAIR) 10 MG tablet Take 1 tablet (10 mg) by mouth every evening. 90 tablet 3    mupirocin (BACTROBAN) 2 % ointment APPLY 1 APPLICATION TOPICALLY 2 TIMES DAILY. USE A SMALL AMOUNT AS DIRECTED 22 g 1    naloxone (NARCAN) 4 mg/0.1 mL nasal spray For suspected opioid overdose, call 911! Then spray once in one nostril. Repeat after 3 minutes if no or minimal response using a new spray in other nostril.      NOVOLOG FLEXPEN 100 UNIT/ML injection pen INJECT 20 UNITS UNDER THE SKIN 3 TIMES DAILY AS NEEDED FOR HIGH BLOOD SUGAR. IN THE MORNING 20 UNITS + SLIDING SCALE MID DAY: 17 + SLIDING SCALE AND IN THE EVENING 12 + SLIDING SCALE 15 each 11    ondansetron (ZOFRAN ODT) 8 MG disintegrating tablet TAKE 1 TABLET ON OR UNDER THE TONGUE EVERY 8 HOURS AS NEEDED FOR NAUSEA/VOMITING. 30 tablet 2    oxycodone-acetaminophen (PERCOCET) 10-325 MG tablet Take 1 tablet by mouth every 8 hours as needed for Severe Pain (Pain Score 7-10). 90 tablet 0    pantoprazole (PROTONIX) 40 MG tablet Take 1 tablet (40 mg) by mouth every morning (before breakfast). 90 tablet 3    predniSONE (DELTASONE) 10 MG tablet Take 1 tablet (10 mg) by mouth daily. 90 tablet 1    [START ON 05/04/2021] pregabalin (LYRICA) 75 MG capsule Take 1 capsule (75 mg) by mouth every morning (before breakfast) AND 2 capsules (150 mg) every evening. Do all this for  30 days. 90 capsule 3    sertraline (ZOLOFT) 100 MG tablet Take 1/2 tablet in the morning, then  increase to 1 tablet therafter 30 tablet 1    TECHLITE PEN NEEDLES 32G X 4 MM MISC USE ONE PEN NEEDLE WITH EACH INSULIN ADMINISTRATION. 200 each 11     No current facility-administered medications for this visit.     I personally spent 40 total minutes in face-to-face and non-face-to-face activities related to the patients visit today, excluding any separately reportable services/procedures.    Objective:     Vitals:    04/16/21 1326   BP: 142/74   BP Location: Right arm   BP Patient Position: Sitting   BP cuff size: Large   Pulse: 98   Resp: 16   Temp: 97.4 F (36.3 C)   TempSrc: Temporal   SpO2: 100%   Weight: 96.6 kg (213 lb)   Height: 5' 5"  (1.651 m)         Labs and Imaging:     Lab Results   Component Value Date    WBC 9.3 11/21/2020    RBC 4.13 11/21/2020    HGB 10.2 (L) 11/21/2020    HCT 35.6 11/21/2020    MCV 86.2 11/21/2020    MCHC 28.7 (L) 11/21/2020    RDW 17.0 (H) 11/21/2020    PLT 251 11/21/2020    MPV 11.1 11/21/2020       Lab Results   Component Value Date    BUN 15 11/21/2020    CREAT 0.97 (H) 11/21/2020    CL 103 11/21/2020    NA 142 11/21/2020    K 4.2 11/21/2020    [place] 9.4 11/21/2020    TBILI 0.21 11/21/2020    ALB 4.1 11/21/2020    TP 6.6 11/21/2020    AST 15 11/21/2020    ALK 138 (H) 11/21/2020    BICARB 30 (H) 11/21/2020    ALT 26 11/21/2020    GLU 237 (H) 11/21/2020       No results found.      Followup:       Return in about 6 months (around 10/14/2021).    Future Appointments   Date Time Provider [place]   04/16/2021  1:00 PM [name], MD [facility] [place]   06/03/2021  3:00 PM [name], MD [facility] Therisa Doyne, MD  [place]  Internal Medicine

## 2021-04-16 NOTE — Assessment & Plan Note (Addendum)
Stable. Last A1c7.2%(03/2021), UACR UTC(06/2020). On Lantus 48, Novolog 20-17-12TID plusISS,(stoppedJardiance 25 mg dailydue to yeast infections, stopped glipizide, metformin cause GI issues). Last eye exam 03/27/2021. Endo/CDE referral(placed 06/2020).  Average 130-160, had episode of low BS 47 (11/28/20) and sent to ED  -Discussed importance of BG management   -Continue current regimen  -Can consider adding Januvia at next visit, denies pancreatitis  -Cont ARB/statin  -ENDO/CDE referral in   -Continue annual eye exams, referral placed  -F/u as planned

## 2021-04-16 NOTE — Assessment & Plan Note (Signed)
BP elevated in clinic and likely due to pain. Goal <130/80  OnMetoprolol 25 BID, Losartan 50 daily.   Checks BP at home, 130/70s and exacerbated with underlying pain   -cont current regimen  -pain management  -diet/exercise

## 2021-04-17 ENCOUNTER — Other Ambulatory Visit (INDEPENDENT_AMBULATORY_CARE_PROVIDER_SITE_OTHER): Payer: Self-pay | Admitting: Student in an Organized Health Care Education/Training Program

## 2021-04-17 ENCOUNTER — Encounter (INDEPENDENT_AMBULATORY_CARE_PROVIDER_SITE_OTHER): Payer: Self-pay | Admitting: Student in an Organized Health Care Education/Training Program

## 2021-04-17 DIAGNOSIS — I10 Essential (primary) hypertension: Secondary | ICD-10-CM

## 2021-04-17 DIAGNOSIS — G894 Chronic pain syndrome: Secondary | ICD-10-CM

## 2021-04-17 MED ORDER — OXYCODONE-ACETAMINOPHEN 10-325 MG OR TABS
1.0000 | ORAL_TABLET | Freq: Three times a day (TID) | ORAL | 0 refills | Status: DC | PRN
Start: 2021-04-17 — End: 2021-04-29

## 2021-04-17 NOTE — Telephone Encounter (Signed)
From: Janit Glosser  To: [name], MD  Sent: 04/17/2021 10:50 AM PST  Subject: Prescription     You said you sent in a new prescription for the oxycodone acetaminophen 10-325 yesterday but the pharmacy dont have it can you resend it in to [facility] [place] [id number] please thank you so much

## 2021-04-17 NOTE — Telephone Encounter (Signed)
Kimberly Curtis, CPhT  (Rx Refill and PA Clinic)      Angiotensin II Receptor Blocker (ARB) Refill Protocol    Last visit in enc specialty: 04/16/2021     Recent Visits in This Encounter Department     Date Provider Department Visit Type Primary Dx    04/16/2021 [name], MD [facility] [place] Office Visit Type 2 diabetes mellitus with diabetic autonomic neuropathy, with long-term current use of insulin (CMS-HCC)    01/14/2021 [name], MD [facility] [place] Office Visit Chronic pain syndrome    12/18/2020 [name], MD [facility] discharge follow-up    11/21/2020 [name], MD [facility] [place] Shortness of breath    10/12/2020 [name], MD [facility] discharge follow-up         [place] Visits  Recent [facility] Visits    None       Next f/u appt due:  Return in about 6 months (around 10/14/2021).  Next appt in enc specialty: 06/03/2021      Future Appointments 04/17/2021 - 04/16/2026      Date Visit Type Department Provider     06/03/2021  3:00 PM [facility] Ricks, Larkin Ina, MD    Appointment Notes:     maw                  Per OV note on 04/16/2021:  History:  Sarcoidosisc/b frequent PNA/bronchitis s/p bronchial stent-previously onchronic steroids,tapered offPrednisone 10 mg,Albuterol neb PRN, MDI uses more frequently, montelukast 10 mg daily, Trelegy daily. Had bronchial stents recently taken out on 04/30/2020 in [place]. [facility] CT [facility]2020.[facility].On 3L 02continuous  Chronic coughcausingB/l abd/thoracic pain-intermittent. Previously prescribed Tussionex  T2DMw/peripheralneuropathies-last A1c7.2%(03/2021), UACR UTC(06/2020). On Lantus 48, Novolog 20-17-12TID plusISS,(stoppedJardiance 25 mg dailydue to yeast infections, stopped glipizide,  metformin cause GI issues). Last eye exam 03/27/2021. Endo/CDE referral(placed 06/2020).  Hx ofVFIB/TACH/prolonged QTC-s/p DC AICD placement 7/16[facility]7/20/22 at [facility], following with Cardiology at [facility]  HTN-onMetoprolol 25 BID,Losartan50daily. Checks BP at home, 130/70sand exacerbated with underlying pain  HLD-onLipitor 40 per  GERD-Protonix 40 mg daily, Pepcid 40 daily. Pending GI f/u (placed 08/2020 - has not scheduled)  Nausea, GI upset, chronic-pending gastric emptying study, stopped Reglan due to QTC. Takes Zofran PRN  Diverticulosis-colonoscopy done ~2019  Obesity, BMI36  Chronic pain, OA of knees/back/hands/knees, sarcoid pain-on Lyrica 75/150, Percocet 10PRN(takes 0.5 as needed for breakthrough). StoppedGabapentin,Tramadol, Baclofen,and Amitryptaline due to qtc. Has Narcan at home, never used. Previously seeing pain specialist in [place]. Pending Pain evalat [place]  Anxiety/MDD/insomnia-following with Psychiatry. Started on Sertraline 25 and mirtazapine 15  Allergies-Allegra 180 daily  Itching-Hydroxyzine 50 mg PRN(not using)  S/p hysterectomy-right ovary intact, done in 1996  S/p L-knee surgery-2003, torn meniscus, ACL repair  A/P:   Essential hypertension     Current Assessment & Plan     BP elevated in clinic and likely due to pain. Goal <130/80  OnMetoprolol 25 BID,Losartan50daily.   Checks BP at home, 130/70sand exacerbated with underlying pain  -cont current regimen  -pain management  -diet/exercise         Relevant Orders    Consult/Referral to Cardiology Clinic         Didn't see any lab comments as well as patient outreach via [place], Letter or Telephone call to patient, re: lab results    LABS required:  (  Q year Potassium, Creatinine)    Lab Results   Component Value Date    NA 142 11/21/2020    K 4.2 11/21/2020    CL 103 11/21/2020    BICARB 30 (H) 11/21/2020    BUN 15 11/21/2020    CREAT 0.97 (H) 11/21/2020       Lab Results   Component Value Date    GFRNON >60 11/21/2020     EGFRCKDEPI >60 11/21/2020         Monitoring required:  (Q year BP) (pregnancy prn)  *If pt is pregnant, REFER TO MD*    (BP range: OMAYOKHT=97-741  SELTRVUYE=33-43)  Blood Pressure   04/16/21 142/74   04/15/21 128/76   04/02/21 (!) 196/82       Pulse Readings from Last 3 Encounters:   04/16/21 98   04/15/21 80   04/02/21 80         Last Digital Health Monitoring Vitals:        Last MyChart BP Values:        Last Pt Entered MyChart BP Values:        Beta Blocker Refill Protocol      LABS required:  (None)    Monitoring required:  (Q year BP, HR)    (BP range: HWYSHUOH=72-902  XJDBZMCEY=22-33)  Blood Pressure   04/16/21 142/74   04/15/21 128/76   04/02/21 (!) 196/82       (HR range: 55-110)  Pulse Readings from Last 3 Encounters:   04/16/21 98   04/15/21 80   04/02/21 80         Last Digital Health Monitoring Vitals:        Last MyChart BP Values:        Last Pt Entered MyChart BP Values:

## 2021-04-17 NOTE — Telephone Encounter (Signed)
Medications filled.  

## 2021-04-18 MED ORDER — METOPROLOL TARTRATE 50 MG OR TABS
50.0000 mg | ORAL_TABLET | Freq: Two times a day (BID) | ORAL | 3 refills | Status: DC
Start: 2021-04-18 — End: 2022-03-28

## 2021-04-18 MED ORDER — LOSARTAN POTASSIUM 50 MG OR TABS
50.0000 mg | ORAL_TABLET | Freq: Every day | ORAL | 2 refills | Status: DC
Start: 2021-04-18 — End: 2021-10-02

## 2021-04-19 NOTE — Result Encounter Note (Signed)
Hi Sandra/Rosemay,  Please inform patient/referring/PCP that no concerning issues on biopsies OTHER THAN ADENOMA. Same recommendations as per endoscopy report. Any questions, confusion or issues, please let me know.    Thanks,  Abbas

## 2021-04-23 ENCOUNTER — Telehealth (INDEPENDENT_AMBULATORY_CARE_PROVIDER_SITE_OTHER): Payer: Self-pay | Admitting: Gastroenterology

## 2021-04-26 ENCOUNTER — Telehealth (INDEPENDENT_AMBULATORY_CARE_PROVIDER_SITE_OTHER): Payer: Self-pay

## 2021-04-26 ENCOUNTER — Encounter (INDEPENDENT_AMBULATORY_CARE_PROVIDER_SITE_OTHER): Payer: Self-pay | Admitting: Student in an Organized Health Care Education/Training Program

## 2021-04-26 ENCOUNTER — Other Ambulatory Visit (INDEPENDENT_AMBULATORY_CARE_PROVIDER_SITE_OTHER): Payer: Self-pay | Admitting: Psychiatry

## 2021-04-26 DIAGNOSIS — G894 Chronic pain syndrome: Secondary | ICD-10-CM

## 2021-04-26 DIAGNOSIS — F331 Major depressive disorder, recurrent, moderate: Secondary | ICD-10-CM

## 2021-04-26 MED ORDER — MIRTAZAPINE 30 MG OR TABS
ORAL_TABLET | ORAL | 1 refills | Status: DC
Start: 2021-04-26 — End: 2022-03-19

## 2021-04-26 MED ORDER — SERTRALINE HCL 100 MG OR TABS
ORAL_TABLET | ORAL | 1 refills | Status: DC
Start: 2021-04-26 — End: 2021-11-28

## 2021-04-26 NOTE — Telephone Encounter (Signed)
04/15/21 procedure/path results letter mailed to patient at address listed in epic.

## 2021-04-29 ENCOUNTER — Telehealth (INDEPENDENT_AMBULATORY_CARE_PROVIDER_SITE_OTHER): Payer: No Typology Code available for payment source | Admitting: Family Practice

## 2021-04-29 DIAGNOSIS — G894 Chronic pain syndrome: Secondary | ICD-10-CM

## 2021-04-29 MED ORDER — OXYCODONE-ACETAMINOPHEN 5-325 MG OR TABS
2.0000 | ORAL_TABLET | Freq: Every day | ORAL | 0 refills | Status: AC | PRN
Start: 2021-04-29 — End: 2021-05-29

## 2021-04-29 NOTE — Progress Notes (Signed)
This patient gave consent for this Medical Advice message and is aware that it may result in a bill to their insurance, as well as the possibility of receiving a bill for a co-pay and/or deductible. They are an established patient but are not seeking information exclusively about a problem treated during an in person or video visit in the last seven days. I do not recommend an in-person or video visit within seven days of my reply.     See the MyChart message from patient and my reply for my assessment and plan.  MyChart encounter date:  04/26/2021    I spent a total of  6 minutes reviewing the patient's prior medical records and current request for medical advice, prescribing medications, or ordering tests (if applicable), replying to the patient, and documenting the encounter.

## 2021-04-29 NOTE — Telephone Encounter (Signed)
From: Keirstin Droll  To: [name], MD  Sent: 04/26/2021 3:35 PM PST  Subject: Pain medication     The pharmacy does not have the oxycodone 10-325 so if you can send in a prescription for something different but on the same level I would truly appreciate it Thanks again

## 2021-05-09 ENCOUNTER — Other Ambulatory Visit (INDEPENDENT_AMBULATORY_CARE_PROVIDER_SITE_OTHER): Payer: Self-pay | Admitting: Student in an Organized Health Care Education/Training Program

## 2021-05-09 ENCOUNTER — Telehealth (INDEPENDENT_AMBULATORY_CARE_PROVIDER_SITE_OTHER): Payer: Self-pay | Admitting: Student in an Organized Health Care Education/Training Program

## 2021-05-09 DIAGNOSIS — R110 Nausea: Secondary | ICD-10-CM

## 2021-05-09 NOTE — Telephone Encounter (Signed)
Established with:  Last OV with PCP:  Next OV with Dept:  Future Appointments: Kimberly Curtis   04/16/2021  06/03/2021  Future Appointments   Date Time Provider [place]   06/03/2021  3:00 PM Kimberly Balint, MD E[facility]        Medication requested:   Requested Prescriptions     Pending Prescriptions Disp Refills   . ondansetron (ZOFRAN ODT) 8 MG disintegrating tablet [Pharmacy Med Name: ONDANSETRON ODT 8 MG TABLET] 30 tablet 2     Sig: TAKE 1 TABLET ON OR UNDER THE TONGUE EVERY 8 HOURS AS NEEDED FOR NAUSEA/VOMITING.       Last Filled Date 03/05/21   Quantity Last Filled  30   Refill 2   Send to:    [id number] - [place], [place] - [address] [place] [address]  Phone: [phone] Fax: [phone]      Current Medication(s):  Current Outpatient Medications   Medication Sig Dispense Refill   . albuterol (PROVENTIL) (2.5 MG/3ML) 0.083% nebulization 3 mL (2.5 mg) by Nebulization route every 4 hours as needed for Wheezing. 1 each 2   . albuterol 108 (90 Base) MCG/ACT inhaler Inhale 2 puffs by mouth every 6 hours as needed for Wheezing. 1 each 0   . Albuterol Sulfate 108 (90 Base) MCG/ACT AEPB Inhale 1 puff by mouth as needed.     . ASPIRIN LOW DOSE 81 MG EC tablet TAKE 1 TABLET BY MOUTH EVERY DAY 120 tablet 1   . atorvastatin (LIPITOR) 40 MG tablet Take 1 tablet (40 mg) by mouth daily. 90 tablet 3   . Continuous Blood Gluc Receiver (CONTINUOUS BLOOD GLUCOSE, DEXCOM G6, RECEIVER) Use daily to read glucose topically 1 each 0   . Continuous Blood Gluc Sensor (CONTINUOUS BLOOD GLUCOSE, DEXCOM G6, SENSOR) Insert subcutaneously every 10 days. 10 each 4   . Continuous Blood Gluc Transmit (CONTINUOUS BLOOD GLUCOSE, DEXCOM G6, TRANSMITTER) Use daily to transmit subcutaneous glucose value. Change every 3 months. 1 each 4   . famotidine (PEPCID) 40 MG tablet TAKE 1 TABLET BY MOUTH EVERY DAY 90 tablet 1   . fexofenadine (ALLEGRA) 180 MG tablet Take 1 tablet (180 mg) by mouth daily. 90  tablet 1   . fluticasone-umeclidinium-vilanterol (TRELEGY ELLIPTA) 100-62.5-25 MCG/INH AEPB Inhale 1 puff by mouth daily.     . folic acid (FOLVITE) 1 MG tablet TAKE 1 TABLET BY MOUTH EVERY DAY 90 tablet 1   . Hydrocod Polst-CPM Polst ER 10-8 MG/5ML SUER Take 5 mL by mouth every 12 hours as needed (cough). 70 mL 0   . hydrOXYzine HCL (ATARAX) 50 MG tablet Take 1 tablet (50 mg) by mouth 2 times daily as needed for Itching. 180 tablet 3   . insulin glargine (LANTUS SOLOSTAR) 100 units/mL injection pen Inject 48 Units under the skin nightly. 15 mL 2   . losartan (COZAAR) 50 MG tablet Take 1 tablet (50 mg) by mouth daily. 90 tablet 2   . metoprolol tartrate (LOPRESSOR) 50 MG tablet Take 1 tablet (50 mg) by mouth 2 times daily. 180 tablet 3   . mirtazapine (REMERON) 30 MG tablet TAKE 1 TABLET BY MOUTH NIGHTLY 90 tablet 1   . montelukast (SINGULAIR) 10 MG tablet Take 1 tablet (10 mg) by mouth every evening. 90 tablet 3   . mupirocin (BACTROBAN) 2 % ointment APPLY 1 APPLICATION TOPICALLY 2 TIMES DAILY. USE A SMALL AMOUNT AS DIRECTED 22 g 1   .  naloxone (NARCAN) 4 mg/0.1 mL nasal spray For suspected opioid overdose, call 911! Then spray once in one nostril. Repeat after 3 minutes if no or minimal response using a new spray in other nostril.     Marland Kitchen NOVOLOG FLEXPEN 100 UNIT/ML injection pen INJECT 20 UNITS UNDER THE SKIN 3 TIMES DAILY AS NEEDED FOR HIGH BLOOD SUGAR. IN THE MORNING 20 UNITS + SLIDING SCALE MID DAY: 17 + SLIDING SCALE AND IN THE EVENING 12 + SLIDING SCALE 15 each 11   . ondansetron (ZOFRAN ODT) 8 MG disintegrating tablet TAKE 1 TABLET ON OR UNDER THE TONGUE EVERY 8 HOURS AS NEEDED FOR NAUSEA/VOMITING. 30 tablet 2   . oxyCODONE-acetaminophen (PERCOCET) 5-325 MG tablet Take 2 tablets by mouth daily as needed for Severe Pain (Pain Score 7-10) for up to 30 days. 60 tablet 0   . pantoprazole (PROTONIX) 40 MG tablet Take 1 tablet (40 mg) by mouth every morning (before breakfast). 90 tablet 3   . predniSONE (DELTASONE)  10 MG tablet Take 1 tablet (10 mg) by mouth daily. 90 tablet 1   . pregabalin (LYRICA) 75 MG capsule Take 1 capsule (75 mg) by mouth every morning (before breakfast) AND 2 capsules (150 mg) every evening. Do all this for 30 days. 90 capsule 3   . sertraline (ZOLOFT) 100 MG tablet Take 1 tablet in the morning 90 tablet 1   . TECHLITE PEN NEEDLES 32G X 4 MM MISC USE ONE PEN NEEDLE WITH EACH INSULIN ADMINISTRATION. 200 each 11     No current facility-administered medications for this visit.       Patient information: Allergies   Allergen Reactions   . Sulfa Drugs Anaphylaxis   . Benadryl [Diphenhydramine] Rash and Itching     Per 12/25/20 TE: Do you have any additional known allergies to over the counter or prescription medications? Yes   If yes, please list additional medication(s) dye codeine on benadryl  and explain reaction(s)itching, rash    . Latex Itching     Per 12/25/20 TE: Are you allergic to latex? Yes   If yes, please explain reaction(s)itchiness    . Morphine Itching      Health Maintenance Due   Topic Date Due   . Breast Cancer Screen  Never done   . Medicare Annual Wellness Visit  Never done      Last labs:  Lab Results   Component Value Date    CHOL 217 10/30/2020    HDL 72 10/30/2020    LDLCALC 119 10/30/2020    TRIG 129 10/30/2020    TSH 1.29 07/09/2020    A1C 7.2 (H) 04/02/2021      Blood Pressure   04/16/21 142/74   04/15/21 128/76   04/02/21 (!) 196/82    No components found for: 45M

## 2021-05-09 NOTE — Telephone Encounter (Signed)
[facility]     Ondansetron (Zofran ODT) is not currently included in the Pharmacy Refill Clinic protocols. Re-routing to the responsible staff for processing. Thank you!

## 2021-05-09 NOTE — Telephone Encounter (Signed)
General Inquiry     Who is calling: Ancillary: nessa from [facility] shield  is calling on behalf of patient    Reason for this call: [employer] calling to request PCP to place a PA on medication novolog insulin.     Action required by office: Please contact caller     Duplicate encounter? No previous documentation found on this issue.     Best way to contact: [phone]      Inquiry has been read verbatim to this caller. Verbalizes satisfaction and confirms the above is accurate: yes    Has been advised this message will be transmitted to office and can expect a response within the next 24-72 hours.    Encounter created by Care Assist MA.  If further action required please route encounter to appropriate in clinic MA/LVN/Resident Pool

## 2021-05-09 NOTE — Telephone Encounter (Signed)
Prior Auth Request for Novolog  has been placed in queue for processing. Please allow up to 72 business hours for initiation.     If this is an urgent request due to immediate therapy please re-route as high priority.       Thank you!     [facility]   Refill and Prior Auth Clinical Services

## 2021-05-10 MED ORDER — ONDANSETRON 8 MG OR TBDP
ORAL_TABLET | ORAL | 2 refills | Status: DC
Start: 2021-05-10 — End: 2021-07-10

## 2021-05-10 NOTE — Telephone Encounter (Signed)
Prior Auth requested for [employer] is non-formulary, insurance prefers Humalog (tier 3)        Plan prefers:  Humalog        If change is appropriate, please send new Rx for alternative to the pharmacy.     If Prior Auth is preferred, please provide justification.        Thank you!    [facility]   Refill and Prior [place]

## 2021-05-16 ENCOUNTER — Other Ambulatory Visit (INDEPENDENT_AMBULATORY_CARE_PROVIDER_SITE_OTHER): Payer: Self-pay | Admitting: Student in an Organized Health Care Education/Training Program

## 2021-05-16 DIAGNOSIS — D869 Sarcoidosis, unspecified: Secondary | ICD-10-CM

## 2021-05-16 NOTE — Telephone Encounter (Signed)
[place] [facility]     prednisone is not currently included in the Pharmacy Refill Clinic protocols. Re-routing to the responsible staff for processing.  Thank you

## 2021-05-21 NOTE — Telephone Encounter (Addendum)
Need f/u to discuss prednisone  RX cancelled

## 2021-05-21 NOTE — Addendum Note (Signed)
Addended by: [name] on: 05/21/2021 04:43 PM     Modules accepted: Orders

## 2021-05-23 ENCOUNTER — Encounter (HOSPITAL_BASED_OUTPATIENT_CLINIC_OR_DEPARTMENT_OTHER): Payer: Self-pay

## 2021-05-24 NOTE — Telephone Encounter (Signed)
PAR denied please review

## 2021-05-28 NOTE — Telephone Encounter (Signed)
Reviewed, will discuss on 3/13

## 2021-05-30 ENCOUNTER — Encounter: Payer: Self-pay | Admitting: Hospital

## 2021-05-31 ENCOUNTER — Other Ambulatory Visit: Payer: Self-pay

## 2021-05-31 ENCOUNTER — Encounter (INDEPENDENT_AMBULATORY_CARE_PROVIDER_SITE_OTHER): Payer: Self-pay | Admitting: Hospital

## 2021-05-31 NOTE — Telephone Encounter (Signed)
Health Risk Assessment Questionnaire: Care Navigator sent my-chart message and left voicemail regarding completing the Health Risk Assessment questionnaire for upcoming Medicare Annual Wellness Visit.

## 2021-06-03 ENCOUNTER — Ambulatory Visit (INDEPENDENT_AMBULATORY_CARE_PROVIDER_SITE_OTHER)
Payer: No Typology Code available for payment source | Admitting: Student in an Organized Health Care Education/Training Program

## 2021-06-03 NOTE — Progress Notes (Deleted)
Internal Medicine Clinic Progress Note     No chief complaint on file.      Subjective:      Kimberly Curtis is a 66 year old female here for follow-up.        History:  Sarcoidosisc/b frequent PNA/bronchitis s/p bronchial stent-previously onchronic steroids,tapered offPrednisone 10 mg,Albuterol neb PRN, MDI uses more frequently, montelukast 10 mg daily, Trelegy daily. Had bronchial stents recently taken out on 04/30/2020 in [place]. [facility] CT [facility].On 3L 02continuous  Chronic coughcausingB/l abd/thoracic pain-intermittent. Previously prescribed Tussionex  T2DMw/peripheralneuropathies-last A1c7.2%(03/2021), UACR UTC(06/2020). On Lantus 48, Novolog 20-17-12TID plusISS,(stoppedJardiance 25 mg dailydue to yeast infections, stopped glipizide, metformin cause GI issues). Last eye exam 03/27/2021. Endo/CDE referral(placed 06/2020).  Hx ofVFIB/TACH/prolonged QTC-s/p DC AICD placement 7/16[facility]7/20/[facility], following with Cardiology at [facility]  HTN-onMetoprolol 25 BID,Losartan50daily. Checks BP at home, 130/70sand exacerbated with underlying pain  HLD-onLipitor 40 per  GERD-Protonix 40 mg daily, Pepcid 40 daily. Pending GI f/u (placed 08/2020 - has not scheduled)  Nausea, GI upset, chronic-pending gastric emptying study, stopped Reglan due to QTC. Takes Zofran PRN  Diverticulosis-colonoscopy done ~2019  Obesity, BMI36  Chronic pain, OA of knees/back/hands/knees, sarcoid pain-on Lyrica 75/150, Percocet 10PRN(takes 0.5 as needed for breakthrough). StoppedGabapentin,Tramadol, Baclofen,and Amitryptaline due to qtc. Has Narcan at home, never used. Previously seeing pain specialist in [place]. Pending Pain evalat [place]  Anxiety/MDD/insomnia-following with Psychiatry. Started on Sertraline 25 and mirtazapine 15  Allergies-Allegra 180 daily  Itching-Hydroxyzine 50 mg PRN(not using)  S/p hysterectomy-right ovary intact, done in 1996  S/p L-knee  surgery-2003, torn meniscus, ACL repair     Social Hx:  Retired, previous [employer] disability. Has 4 children. Stopped tobacco in 1996 (10 pack year history). Denies alcohol use.    Assessment and Plan:     Problem List Items Addressed This Visit    None      HCM      Medication Review:  Medications reviewed with patient and medication list reconciled.  Over the counter medications, herbal therapies and supplements reviewed.  Patient's understanding and response to medications assessed.   Barriers to medications assessed and addressed.   Risks, benefits, alternatives to medications reviewed.  No barriers to learning, verbalizes understanding of teaching and instructions.    Medications at the end of this encounter:  Current Outpatient Medications   Medication Sig Dispense Refill   . albuterol (PROVENTIL) (2.5 MG/3ML) 0.083% nebulization 3 mL (2.5 mg) by Nebulization route every 4 hours as needed for Wheezing. 1 each 2   . albuterol 108 (90 Base) MCG/ACT inhaler Inhale 2 puffs by mouth every 6 hours as needed for Wheezing. 1 each 0   . Albuterol Sulfate 108 (90 Base) MCG/ACT AEPB Inhale 1 puff by mouth as needed.     . ASPIRIN LOW DOSE 81 MG EC tablet TAKE 1 TABLET BY MOUTH EVERY DAY 120 tablet 1   . atorvastatin (LIPITOR) 40 MG tablet Take 1 tablet (40 mg) by mouth daily. 90 tablet 3   . Continuous Blood Gluc Receiver (CONTINUOUS BLOOD GLUCOSE, DEXCOM G6, RECEIVER) Use daily to read glucose topically 1 each 0   . Continuous Blood Gluc Sensor (CONTINUOUS BLOOD GLUCOSE, DEXCOM G6, SENSOR) Insert subcutaneously every 10 days. 10 each 4   . Continuous Blood Gluc Transmit (CONTINUOUS BLOOD GLUCOSE, DEXCOM G6, TRANSMITTER) Use daily to transmit subcutaneous glucose value. Change every 3 months. 1 each 4   . famotidine (PEPCID) 40 MG tablet TAKE 1 TABLET BY  MOUTH EVERY DAY 90 tablet 1   . fexofenadine (ALLEGRA) 180 MG tablet Take 1 tablet (180 mg) by mouth daily. 90 tablet 1   . fluticasone-umeclidinium-vilanterol  (TRELEGY ELLIPTA) 100-62.5-25 MCG/INH AEPB Inhale 1 puff by mouth daily.     . folic acid (FOLVITE) 1 MG tablet TAKE 1 TABLET BY MOUTH EVERY DAY 90 tablet 1   . Hydrocod Polst-CPM Polst ER 10-8 MG/5ML SUER Take 5 mL by mouth every 12 hours as needed (cough). 70 mL 0   . hydrOXYzine HCL (ATARAX) 50 MG tablet Take 1 tablet (50 mg) by mouth 2 times daily as needed for Itching. 180 tablet 3   . insulin glargine (LANTUS SOLOSTAR) 100 units/mL injection pen Inject 48 Units under the skin nightly. 15 mL 2   . losartan (COZAAR) 50 MG tablet Take 1 tablet (50 mg) by mouth daily. 90 tablet 2   . metoprolol tartrate (LOPRESSOR) 50 MG tablet Take 1 tablet (50 mg) by mouth 2 times daily. 180 tablet 3   . mirtazapine (REMERON) 30 MG tablet TAKE 1 TABLET BY MOUTH NIGHTLY 90 tablet 1   . montelukast (SINGULAIR) 10 MG tablet Take 1 tablet (10 mg) by mouth every evening. 90 tablet 3   . mupirocin (BACTROBAN) 2 % ointment APPLY 1 APPLICATION TOPICALLY 2 TIMES DAILY. USE A SMALL AMOUNT AS DIRECTED 22 g 1   . naloxone (NARCAN) 4 mg/0.1 mL nasal spray For suspected opioid overdose, call 911! Then spray once in one nostril. Repeat after 3 minutes if no or minimal response using a new spray in other nostril.     Marland Kitchen NOVOLOG FLEXPEN 100 UNIT/ML injection pen INJECT 20 UNITS UNDER THE SKIN 3 TIMES DAILY AS NEEDED FOR HIGH BLOOD SUGAR. IN THE MORNING 20 UNITS + SLIDING SCALE MID DAY: 17 + SLIDING SCALE AND IN THE EVENING 12 + SLIDING SCALE 15 each 11   . ondansetron (ZOFRAN ODT) 8 MG disintegrating tablet TAKE 1 TABLET ON OR UNDER THE TONGUE EVERY 8 HOURS AS NEEDED FOR NAUSEA/VOMITING. 30 tablet 2   . pantoprazole (PROTONIX) 40 MG tablet Take 1 tablet (40 mg) by mouth every morning (before breakfast). 90 tablet 3   . pregabalin (LYRICA) 75 MG capsule Take 1 capsule (75 mg) by mouth every morning (before breakfast) AND 2 capsules (150 mg) every evening. Do all this for 30 days. 90 capsule 3   . sertraline (ZOLOFT) 100 MG tablet Take 1 tablet in  the morning 90 tablet 1   . TECHLITE PEN NEEDLES 32G X 4 MM MISC USE ONE PEN NEEDLE WITH EACH INSULIN ADMINISTRATION. 200 each 11     No current facility-administered medications for this visit.       Objective:     There were no vitals filed for this visit.    PHYSICAL EXAMINATION ***  GEN - WDWN, NAD  HEENT - AT/NC, Conjunctiva clear. MMM, OP clear  NECK - Supple, no thyroid mass, no cervical LAD  PULM - CTA bilaterally, no wheezing. Normal resp effort  CARDIAC - RRR, normal S1/S2, no m/r/g  GI - Soft, NT/ND, normal BS  EXT -  Warm without edema  NEURO -  Non-focal. EOMI, PERRL, face symmetric, no dysarthria.    PSYCH -  Mood and affect normal      Labs and Imaging:     Lab Results   Component Value Date    WBC 9.3 11/21/2020    RBC 4.13 11/21/2020    HGB 10.2 (L) 11/21/2020  HCT 35.6 11/21/2020    MCV 86.2 11/21/2020    MCHC 28.7 (L) 11/21/2020    RDW 17.0 (H) 11/21/2020    PLT 251 11/21/2020    MPV 11.1 11/21/2020       Lab Results   Component Value Date    BUN 15 11/21/2020    CREAT 0.97 (H) 11/21/2020    CL 103 11/21/2020    NA 142 11/21/2020    K 4.2 11/21/2020    [place] 9.4 11/21/2020    TBILI 0.21 11/21/2020    ALB 4.1 11/21/2020    TP 6.6 11/21/2020    AST 15 11/21/2020    ALK 138 (H) 11/21/2020    BICARB 30 (H) 11/21/2020    ALT 26 11/21/2020    GLU 237 (H) 11/21/2020       No results found.      Followup:       No follow-ups on file.    Future Appointments   Date Time Provider [place]   06/03/2021  3:00 PM [name], MD [facility] Therisa Doyne, MD  [place]  [facility]

## 2021-06-09 ENCOUNTER — Other Ambulatory Visit (INDEPENDENT_AMBULATORY_CARE_PROVIDER_SITE_OTHER): Payer: Self-pay | Admitting: Student in an Organized Health Care Education/Training Program

## 2021-06-09 DIAGNOSIS — E119 Type 2 diabetes mellitus without complications: Secondary | ICD-10-CM

## 2021-06-09 DIAGNOSIS — Z794 Long term (current) use of insulin: Secondary | ICD-10-CM

## 2021-06-10 ENCOUNTER — Encounter (INDEPENDENT_AMBULATORY_CARE_PROVIDER_SITE_OTHER): Payer: Self-pay | Admitting: Student in an Organized Health Care Education/Training Program

## 2021-06-10 DIAGNOSIS — E119 Type 2 diabetes mellitus without complications: Secondary | ICD-10-CM

## 2021-06-10 DIAGNOSIS — G8929 Other chronic pain: Secondary | ICD-10-CM

## 2021-06-10 DIAGNOSIS — G894 Chronic pain syndrome: Secondary | ICD-10-CM

## 2021-06-10 DIAGNOSIS — D869 Sarcoidosis, unspecified: Secondary | ICD-10-CM

## 2021-06-10 DIAGNOSIS — Z794 Long term (current) use of insulin: Secondary | ICD-10-CM

## 2021-06-10 NOTE — Telephone Encounter (Signed)
Prior Auth requested for [employer] on 05/09/2021.    Plan prefers:  Humalog    Was going to discuss with patient for OV on 06/03/2021 but patient no showed.    If change is appropriate, please send new Rx for alternative to the pharmacy.   If Prior Auth is preferred, please provide justification.      Thank you!    [facility]   Refill and Prior [place]

## 2021-06-10 NOTE — Telephone Encounter (Addendum)
Prior Auth Request for Novolog  has been placed in queue for processing. Please allow up to 72 business hours for initiation.     If this is an urgent request due to immediate therapy please re-route as high priority.       Thank you!     [facility]   Refill and Prior Auth Clinical Services

## 2021-06-11 ENCOUNTER — Other Ambulatory Visit (INDEPENDENT_AMBULATORY_CARE_PROVIDER_SITE_OTHER): Payer: Self-pay | Admitting: Student in an Organized Health Care Education/Training Program

## 2021-06-11 DIAGNOSIS — L0292 Furuncle, unspecified: Secondary | ICD-10-CM

## 2021-06-12 NOTE — Telephone Encounter (Signed)
[facility]     Bactroban is not currently included in the Pharmacy Refill Clinic protocols. Re-routing to the responsible staff for processing.  Thank you

## 2021-06-12 NOTE — Telephone Encounter (Signed)
From: Juno Stoke  To: [name], MD  Sent: 06/10/2021 6:55 PM PDT  Subject: Refills     Can you please call in a refill for the for the 5 oxycodone t it was only a 30 day supply we went down from the 10's and also the pharmacy has been trying to get in touch with you for a refill on my Lantus insulin I truly appreciate it thanks

## 2021-06-13 MED ORDER — INSULIN LISPRO (1 UNIT DIAL) 100 UNIT/ML SC SOPN
PEN_INJECTOR | SUBCUTANEOUS | 11 refills | Status: DC
Start: 2021-06-13 — End: 2021-09-03

## 2021-06-14 MED ORDER — MUPIROCIN 2 % EX OINT
1.00 | TOPICAL_OINTMENT | Freq: Two times a day (BID) | CUTANEOUS | 1 refills | Status: DC
Start: 2021-06-14 — End: 2021-08-08

## 2021-06-14 MED ORDER — INSULIN GLARGINE SOLOSTAR 100 UNIT/ML SC SOPN
48.0000 [IU] | PEN_INJECTOR | Freq: Every evening | SUBCUTANEOUS | 2 refills | Status: DC
Start: 2021-06-14 — End: 2021-09-13

## 2021-06-17 ENCOUNTER — Encounter (INDEPENDENT_AMBULATORY_CARE_PROVIDER_SITE_OTHER): Payer: Self-pay | Admitting: Student in an Organized Health Care Education/Training Program

## 2021-06-17 NOTE — Telephone Encounter (Signed)
I have attempted to contact this patient by phone with the following results: called patient in regard to my chart message, no answer, left voicemail to return call to clinic, phone number included..  Mychart message also sent to pt.     CALL CENTER:  Any RN can take the call

## 2021-06-17 NOTE — Telephone Encounter (Signed)
From: Shiniqua Gilberti  To: [name], MD  Sent: 06/17/2021 3:50 PM PDT  Subject: Nose bleeding     Yesterday I had a nose bleeding and it lasted for about 20 minutes and blew out a big blood clot and today I blew out a big blood clot not as big as yesterday but my nose is not bleeding today

## 2021-06-17 NOTE — Telephone Encounter (Signed)
Please assist pt with concern, thank you.

## 2021-06-18 NOTE — Telephone Encounter (Signed)
Closing triage encounter, unable to reach patient via telephone and MyChart messaging.     If further follow up is needed, please route to clinic MA/LVN.  Thank you     Routing to PCP as FYI per protocol.

## 2021-06-19 MED ORDER — HYDROCODONE-ACETAMINOPHEN 5-325 MG OR TABS
1.0000 | ORAL_TABLET | Freq: Four times a day (QID) | ORAL | 0 refills | Status: DC | PRN
Start: 2021-06-19 — End: 2021-06-24

## 2021-06-22 ENCOUNTER — Encounter (INDEPENDENT_AMBULATORY_CARE_PROVIDER_SITE_OTHER): Payer: Self-pay | Admitting: Student in an Organized Health Care Education/Training Program

## 2021-06-22 DIAGNOSIS — M25562 Pain in left knee: Secondary | ICD-10-CM

## 2021-06-22 DIAGNOSIS — D869 Sarcoidosis, unspecified: Secondary | ICD-10-CM

## 2021-06-22 DIAGNOSIS — G8929 Other chronic pain: Secondary | ICD-10-CM

## 2021-06-22 DIAGNOSIS — G894 Chronic pain syndrome: Secondary | ICD-10-CM

## 2021-06-22 DIAGNOSIS — M797 Fibromyalgia: Secondary | ICD-10-CM

## 2021-06-24 MED ORDER — OXYCODONE-ACETAMINOPHEN 5-325 MG OR TABS
1.0000 | ORAL_TABLET | Freq: Four times a day (QID) | ORAL | 0 refills | Status: DC | PRN
Start: 2021-06-24 — End: 2021-08-28

## 2021-06-24 NOTE — Telephone Encounter (Signed)
New Rx sent.

## 2021-07-10 ENCOUNTER — Other Ambulatory Visit (INDEPENDENT_AMBULATORY_CARE_PROVIDER_SITE_OTHER): Payer: Self-pay | Admitting: Internal Medicine

## 2021-07-10 ENCOUNTER — Other Ambulatory Visit (INDEPENDENT_AMBULATORY_CARE_PROVIDER_SITE_OTHER): Payer: Self-pay | Admitting: Student in an Organized Health Care Education/Training Program

## 2021-07-10 DIAGNOSIS — G894 Chronic pain syndrome: Secondary | ICD-10-CM

## 2021-07-10 DIAGNOSIS — R110 Nausea: Secondary | ICD-10-CM

## 2021-07-10 DIAGNOSIS — K219 Gastro-esophageal reflux disease without esophagitis: Secondary | ICD-10-CM

## 2021-07-10 DIAGNOSIS — E782 Mixed hyperlipidemia: Secondary | ICD-10-CM

## 2021-07-10 DIAGNOSIS — T7840XA Allergy, unspecified, initial encounter: Secondary | ICD-10-CM

## 2021-07-10 MED ORDER — ATORVASTATIN CALCIUM 40 MG OR TABS
40.00 mg | ORAL_TABLET | Freq: Every day | ORAL | 1 refills | Status: DC
Start: 2021-07-10 — End: 2022-10-08

## 2021-07-10 MED ORDER — FAMOTIDINE 40 MG OR TABS
40.00 mg | ORAL_TABLET | Freq: Every day | ORAL | 1 refills | Status: AC
Start: 2021-07-10 — End: ?

## 2021-07-10 MED ORDER — ONDANSETRON 8 MG OR TBDP
ORAL_TABLET | ORAL | 2 refills | Status: DC
Start: 2021-07-10 — End: 2021-09-13

## 2021-07-10 NOTE — Telephone Encounter (Addendum)
[place] [facility]       Controlled substances (Refill and PA requests) and Folic Acid 1 mg are not currently included in Pharmacy Refill Clinic protocol. Re-routing to appropriate staff for processing. Thank you.   ---------------------------------------------------------------------------  The following meds were approved per protocol:    Medications ordered in this Encounter   Medications   . famotidine (PEPCID) 40 MG tablet     Sig: Take 1 tablet (40 mg) by mouth daily.     Dispense:  90 tablet     Refill:  1   . atorvastatin (LIPITOR) 40 MG tablet     Sig: Take 1 tablet (40 mg) by mouth daily.     Dispense:  90 tablet     Refill:  1

## 2021-07-10 NOTE — Telephone Encounter (Signed)
[place]     Zofran is not currently included in the Pharmacy Refill Clinic protocols. Re-routing to the responsible staff for processing.  Thank you

## 2021-07-10 NOTE — Telephone Encounter (Signed)
Valeda Malm, CPhT  (Rx Refill and PA Clinic)      [place] Refill Protocol    Last visit in enc specialty: 06/03/2021 No Show    Recent Visits in This Encounter Department     Date Provider Department Visit Type Primary Dx    04/29/2021 [name], MD [facility] Non-Face-to-Face Chronic pain syndrome    04/16/2021 [name], MD [facility] Office Visit Type 2 diabetes mellitus with diabetic autonomic neuropathy, with long-term current use of insulin (CMS-HCC)    01/14/2021 [name], MD [facility] Office Visit Chronic pain syndrome    12/18/2020 [name], MD [facility] discharge follow-up    11/21/2020 [name], MD [facility] Shortness of breath         Population Health Visits  Recent [facility] Visits    None       Next f/u appt due:  Return in about 6 months (around 10/14/2021).  Next appt in enc specialty: 08/28/2021      Future Appointments 07/10/2021 - 07/09/2026      Date Visit Type Department Provider     08/05/2021  3:20 PM RETURN [place] [facility] Sunday Corn, MD    Appointment Notes:     Chronic body pain             08/28/2021  1:20 PM RETURN [facility] Therisa Doyne, MD    Appointment Notes:     follow up                   Per OV note on 04/16/21:  S:HLD-onLipitor 40 per        LABS required:  (Q year Lipid Panel, ALT (baseline only))    Lab Results   Component Value Date    CHOL 217 10/30/2020    TRIG 129 10/30/2020    HDL 72 10/30/2020    NHDLV 145 10/30/2020    [place] 119 10/30/2020        Lab Results   Component Value Date    ALT 26 11/21/2020         Monitoring required:  (None)     H2 Receptor Antagonist (H2RA) Refill Protocol    Per OV note on 04/16/21:  S:GERD-Protonix 40 mg daily, Pepcid 40 daily. Pending GI f/u (placed 08/2020 - has not scheduled)  Nausea, GI upset, chronic-pending gastric  emptying study, stopped Reglan due to QTC. Takes Zofran PRN      LABS required:  (Q year Creatinine)    Lab Results   Component Value Date    NA 142 11/21/2020    K 4.2 11/21/2020    CL 103 11/21/2020    BICARB 30 (H) 11/21/2020    BUN 15 11/21/2020    CREAT 0.97 (H) 11/21/2020   No communication on lab values in lab results, mychart messages, letters or tele.       Lab Results   Component Value Date    GFRNON >60 11/21/2020    EGFRCKDEPI >60 11/21/2020         Monitoring required:  (None)

## 2021-07-12 MED ORDER — FOLIC ACID 1 MG OR TABS
ORAL_TABLET | ORAL | 1 refills | Status: DC
Start: 2021-07-12 — End: 2022-03-19

## 2021-07-12 MED ORDER — PREGABALIN 75 MG OR CAPS
ORAL_CAPSULE | ORAL | 2 refills | Status: DC
Start: 2021-07-12 — End: 2021-11-04

## 2021-07-18 ENCOUNTER — Ambulatory Visit (INDEPENDENT_AMBULATORY_CARE_PROVIDER_SITE_OTHER): Payer: Medicare Other | Admitting: Sleep Studies

## 2021-07-22 ENCOUNTER — Encounter (INDEPENDENT_AMBULATORY_CARE_PROVIDER_SITE_OTHER): Payer: Self-pay | Admitting: Student in an Organized Health Care Education/Training Program

## 2021-07-24 NOTE — Telephone Encounter (Signed)
Sent pt my chart , needs to be seen

## 2021-07-24 NOTE — Telephone Encounter (Signed)
From: Dinia Antos  To: [name], MD  Sent: 07/22/2021 5:04 PM PDT  Subject: Prescription for antibiotics     Dear Dr Jeannette How   Can you please send in a prescription for antibiotics for me. The reason I'm asking is because my mucus is medium to dark green so if you can please send me in a prescription I don't want this to turn into pneumonia I have a appointment on July 31 with my pulmonary doctor I thank you very much

## 2021-08-02 ENCOUNTER — Other Ambulatory Visit (INDEPENDENT_AMBULATORY_CARE_PROVIDER_SITE_OTHER): Payer: No Typology Code available for payment source

## 2021-08-02 ENCOUNTER — Encounter (INDEPENDENT_AMBULATORY_CARE_PROVIDER_SITE_OTHER): Payer: Self-pay

## 2021-08-02 ENCOUNTER — Telehealth (INDEPENDENT_AMBULATORY_CARE_PROVIDER_SITE_OTHER): Payer: Self-pay | Admitting: Nurse Practitioner

## 2021-08-02 ENCOUNTER — Other Ambulatory Visit: Payer: No Typology Code available for payment source | Attending: Nurse Practitioner | Admitting: Nurse Practitioner

## 2021-08-02 VITALS — BP 140/62 | HR 67 | Temp 97.0°F | Resp 16

## 2021-08-02 DIAGNOSIS — R058 Other specified cough: Secondary | ICD-10-CM

## 2021-08-02 DIAGNOSIS — R059 Cough, unspecified: Secondary | ICD-10-CM

## 2021-08-02 DIAGNOSIS — D869 Sarcoidosis, unspecified: Secondary | ICD-10-CM

## 2021-08-02 DIAGNOSIS — Z20822 Contact with and (suspected) exposure to covid-19: Secondary | ICD-10-CM | POA: Insufficient documentation

## 2021-08-02 LAB — RAPID INFLUENZA NAAT (POCT)
Influenza A, Rapid: NOT DETECTED
Influenza B, Rapid: NOT DETECTED

## 2021-08-02 LAB — COVID-19 RAPID NAAT (POCT): COVID-19 Rapid Assay (POCT): NOT DETECTED

## 2021-08-02 MED ORDER — PROMETHAZINE-DM 6.25-15 MG/5ML OR SYRP
5.0000 mL | ORAL_SOLUTION | Freq: Four times a day (QID) | ORAL | 0 refills | Status: DC | PRN
Start: 2021-08-02 — End: 2021-11-28

## 2021-08-02 MED ORDER — PROMETHAZINE-DM 6.25-15 MG/5ML OR SYRP
5.0000 mL | ORAL_SOLUTION | Freq: Four times a day (QID) | ORAL | 0 refills | Status: DC | PRN
Start: 2021-08-02 — End: 2021-08-02

## 2021-08-02 MED ORDER — DOXYCYCLINE HYCLATE 100 MG OR CAPS
100.0000 mg | ORAL_CAPSULE | Freq: Two times a day (BID) | ORAL | 0 refills | Status: AC
Start: 2021-08-02 — End: 2021-08-09

## 2021-08-02 MED ORDER — HYDROCOD POLI-CHLORPHE POLI ER 10-8 MG/5ML PO SUER
5.0000 mL | Freq: Two times a day (BID) | ORAL | 0 refills | Status: DC | PRN
Start: 2021-08-02 — End: 2021-08-02

## 2021-08-02 MED ORDER — PREDNISONE 20 MG OR TABS
20.0000 mg | ORAL_TABLET | Freq: Every day | ORAL | 0 refills | Status: AC
Start: 2021-08-02 — End: 2021-08-07

## 2021-08-02 MED ORDER — PROMETHAZINE-CODEINE 6.25-10 MG/5ML OR SYRP
5.0000 mL | ORAL_SOLUTION | Freq: Three times a day (TID) | ORAL | 0 refills | Status: DC | PRN
Start: 2021-08-02 — End: 2021-08-02

## 2021-08-02 MED ORDER — CEFPODOXIME PROXETIL 200 MG OR TABS
200.0000 mg | ORAL_TABLET | Freq: Two times a day (BID) | ORAL | 0 refills | Status: AC
Start: 2021-08-02 — End: 2021-08-12

## 2021-08-02 NOTE — Progress Notes (Addendum)
Chief Complaint:   Chief Complaint   Patient presents with   . Cough     Pt c/o cough and chest congestion x 3-4 weeks       Case summary is as follows: Kimberly Curtis is a 66 year old female with chief c/o productive cough with yellow sputum, congestion x 3-4 weeks. Significant underlying pulmonary history (see below).   Has chronic shortness a breath which is at baseline.  She uses 3 L supplemental oxygen 24/7 and has not needed to change her usual use.  She is using Trelegy daily.  She has had occasional wheezing for which she is using her albuterol inhaler every 6 hours with adequate, but temporary relief.  She is not needed to use her nebulizer.  She denies congestion, rhinorrhea, chills, fevers, chest pain, hemoptysis, peripheral edema, orthopnea, PND, nausea, vomiting, diarrhea, ill contacts recent travel.    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 Had bronchial stents recently taken out on 04/30/2020 in [place]. [facility] CT [facility].   Followed by pulmonology, Dr. Nicole Kindred    Hx ofVFIB/TACH/prolonged QTC-s/p DC AICD placement 7/16-7/20/22 at [facility], following with [facility]iology at [facility]  HTN-onMetoprolol 25 BID,Losartan50daily.   HLD-onLipitor     Past Medical History:   Diagnosis Date   . Diabetes mellitus (CMS-HCC)    . Hypertension    . Sarcoidosis of lung (CMS-HCC)     bilat   . Tachycardia 09/2018     Past Surgical History:   Procedure Laterality Date   . AICD pacemaker N/A 09/2018   . CHOLECYSTECTOMY     . HYSTERECTOMY     . KNEE ARTHROSCOPY Left      Social History     Socioeconomic History   . Marital status: Married     Spouse name: Not on file   . Number of children: Not on file   . Years of education: Not on file   . Highest  education level: Not on file   Occupational History   . Not on file   Tobacco Use   . Smoking status: Former   . Smokeless tobacco: Never   Vaping Use   . Vaping status: Not on file   Substance and Sexual Activity   . Alcohol use: Not Currently   . Drug use: Never   . Sexual activity: Not on file   Other Topics Concern   . Not on file   Social History Narrative   . Not on file     Social Determinants of Health     Financial Resource Strain: Not on file   Food Insecurity: Not on file   Transportation Needs: Not on file   Physical Activity: Not on file   Stress: Not on file   Social Connections: Not on file   Intimate Partner Violence: Not on file   Housing Stability: Not on file     Family History   Problem Relation Name Age of Onset   . Diabetes Mother     . Heart Disease Mother     . Kidney Disease Mother     . Hypertension Father     . Alcohol/Drug Father           Review of Systems -  Review of Systems   Constitutional: Negative for  chills and fever.   HENT: Positive for congestion. Negative for postnasal drip, sinus pain and sore throat.    Eyes: Negative for photophobia.   Respiratory: Positive for cough, shortness of breath (chronic, at baseline and unchanged) and wheezing (intermittent, none at present).    Cardiovascular: Negative for chest pain and leg swelling.   Gastrointestinal: Negative for abdominal pain, diarrhea, nausea and vomiting.   Musculoskeletal: Negative for neck stiffness.   Skin: Negative for color change, pallor and rash.   Allergic/Immunologic: Positive for immunocompromised state.   Hematological: Negative for adenopathy.   Psychiatric/Behavioral: Negative for confusion.            Physical Examination:    08/02/21  1028   BP: 140/62   Pulse: 67   Resp: 16   Temp: 97 F (36.1 C)   SpO2: 100%     Vital Signs noted from Triage Page.    General:  WDWN, no distress  Head: NCAT  Eyes: PERRL. No conjunctival injection.  Ears:  External ears without erythema, edema, or otorrhea.  TMs NML.    Mouth:  MMM.  Posterior oropharynx mildly erythematous without tonsillar edema, lesions, or exudate.  Uvula is midline. No uvulitis.  Nose: Mild congestion. No rhinorrhea.  Neck:  Supple.  Non-tender. No lymphadenopathy. No nuchal rigidity  Chest: Scattered rhonchi which clears with coughing, decreased breath sounds bilateral lower lobes, respirations are non-labored. No active wheezing. Speaks in complete sentences with no obvious respiratory distress. +frequent cough is noted.  Heart:  RRR.  Normal S1S2.  No peripheral edema, no cyanosis. Cap refill <2 sec.  Abdominal:  Non-distended  Extremities:  MAE x4   Neuro:  GCS 15. Normal mentation.   Skin: Warm, dry.  No rashes    Results:    Results for orders placed or performed in visit on 08/02/21   Covid-19 Rapid NAAT (POCT)   Result Value Ref Range    COVID-19 Rapid Assay (POCT) Not Detected Not Detected   Rapid Influenza NAAT (POCT)   Result Value Ref Range    Influenza A, Rapid Not Detected Not Detected    Influenza B, Rapid Not Detected Not Detected       CXR 2V (preliminary read per clinic provider):  Dual-chamber AICD.  Increased pulmonary vasculature.  No focal opacity or pleural effusion, mild cardiomegaly -stable from previous imaging.  No acute osseous abnormalities.         ICD-10-CM ICD-9-CM    1. Cough, unspecified type  R05.9 786.2 X-Ray Chest Frontal And Lateral      Respiratory Cult w/Gram Stain, Routine Expectorated Sputum      cefpodoxime (VANTIN) 200 MG tablet      doxyCYCLINE (VIBRAMYCIN) 100 MG capsule      Hydrocod Poli-Chlorphe Poli ER (TUSSIONEX PENNKINETIC ER) 10-8 MG/5ML SUER      predniSONE (DELTASONE) 20 MG tablet          Impression:     Chronically ill, but nontoxic appearing patient presenting with above complaints.  She is 100% on 3 L via nasal cannula.    Flu, COVID negative.    She has a frequent cough.  Some scattered rhonchi which clears with coughing.  Decreased breath sounds bilateral lower lobes.  No active wheezing or  evidence of status asthmaticus.  She is not in any acute respiratory distress.  There are no rales on auscultation, peripheral edema, notable pleural effusions on imaging per my preliminary read.  Low suspicion of acute CHF.  No  tachypnea, tachycardia, hypoxia, clinical evidence of DVT, chest pain, abnormal dyspnea.  Low suspicion of pulmonary embolism.  The patient does not appear clinically septic.    Imaging was obtained.  I appreciated no focal pneumonia.  This is per my preliminary read, official results pending.  This will be communicated to patient via MyChart which he is amenable to.  Given duration of cough and comorbid conditions, will treat presumptively for bacterial component with dual therapy, doxycycline and amoxicillin.  Sputum culture was obtained and will be forwarded to pulmonologist to help expedite follow-up.    She is stable for discharge with outpatient treatment, strict return and ED precautions.  Both her and her husband are amenable to this plan.  -tussionex as needed for cough (may cause drowsiness, do not combine with hydrocodone pills, other sedating medications or alcohol or drive/operate heavy machinery)  -may take above with guaifenesin which will help loosen secretions  -prednisone burst (monitor blood sugars closely)  -albuterol inhaler/nebulizer and trelegy as previously prescribed  -amoxicillin + doxycycline  -chest xray (Final result to be communicated through mychart)  -rest, hydration  -close follow up with PCP and pulmonologist   -in person exam for persistent, new or worsening symptoms  -ER for chest pain, shortness of breath, coughing up blood, leg swelling, dizziness, confusion or other severe symptoms      I ran the patient's name and date-of-birth through the [facility] Department of [place] website which will list controlled drug prescriptions filled at [facility] in the past 12 months. The information in the  patient's CURES report is consistent with their self-report and shows no evidence of controlled substance abuse or doctor shopping.    1140: tussionex on backorder per pharmacy, called and spoke with patient. Will trial phenergan with codeine, she has tolerate in the past. May take with plain guaifenesin.      I discussed with patient/caregiver the nature of this patient's illness/problem, results of all resulted tests, course of treatment, and prospects for recovery/follow up care needs.   Parameters of returning to [place] or ED also discussed with patient. The patient/caregiver understands to return immediately if the symptoms worsen or new symptoms develop.   Patient/caregiver verbalized understanding and agreement with plan and all questions answered.      Portions of this encounter were used with voice recognition software. Some errors, erroneous grammar and misrepresented words may be reflected in the dictation.     Lum Keas ENP, FNP    Supervising physician for this visit is Dr. Doree Fudge.

## 2021-08-02 NOTE — Addendum Note (Signed)
Addended by: Lum Keas on: 08/02/2021 11:45 AM     Modules accepted: Orders

## 2021-08-02 NOTE — Interdisciplinary (Signed)
Name/DOB verified.Pt roomed,VS,and intake done.   Informed on plan of care and aware for any delay; pt verbalized understanding.

## 2021-08-02 NOTE — Telephone Encounter (Signed)
Fax received from [facility], recently prescribed phenergan with codeine also unavailable. RX for phenergan DM sent.

## 2021-08-02 NOTE — Patient Instructions (Addendum)
-  tussionex as needed for cough (may cause drowsiness, do not combine with hydrocodone pills, other sedating medications or alcohol or drive/operate heavy machinery)  -may take above with guaifenesin which will help loosen secretions  -prednisone burst (monitor blood sugars closely)  -albuterol inhaler/nebulizer and trelegy as previously prescribed  -amoxicillin + doxycycline  -chest xray (Final result to be communicated through mychart)  -rest, hydration  -close follow up with PCP and pulmonologist   -in person exam for persistent, new or worsening symptoms  -ER for chest pain, shortness of breath, coughing up blood, leg swelling, dizziness, confusion or other severe symptoms

## 2021-08-02 NOTE — Addendum Note (Signed)
Addended by: Lum Keas on: 08/02/2021 01:10 PM     Modules accepted: Orders

## 2021-08-04 ENCOUNTER — Other Ambulatory Visit (INDEPENDENT_AMBULATORY_CARE_PROVIDER_SITE_OTHER): Payer: Self-pay | Admitting: Student in an Organized Health Care Education/Training Program

## 2021-08-04 DIAGNOSIS — L0292 Furuncle, unspecified: Secondary | ICD-10-CM

## 2021-08-05 ENCOUNTER — Ambulatory Visit: Payer: No Typology Code available for payment source | Attending: Anesthesiology | Admitting: Anesthesiology

## 2021-08-05 ENCOUNTER — Encounter (HOSPITAL_BASED_OUTPATIENT_CLINIC_OR_DEPARTMENT_OTHER): Payer: Self-pay | Admitting: Anesthesiology

## 2021-08-05 VITALS — BP 142/81 | HR 68 | Temp 97.8°F

## 2021-08-05 DIAGNOSIS — M797 Fibromyalgia: Secondary | ICD-10-CM | POA: Insufficient documentation

## 2021-08-05 NOTE — Progress Notes (Addendum)
Pain Clinic Follow Up Note       Pt: Kimberly Curtis  Age: 66 year old  Sex: female  DOB: 13-Oct-1955  MRN: [id number]    Date: Monday Aug 05, 2021  Primary Care Physician: [name]  Last Appt: 10/30/2020    HPI     CC: fibromyalgia     Pain level: Pain Score: 8/10  Pain location: diffuse     Most recent procedure: none    Kimberly Curtis is a 66 year old female who  has a past medical history of Diabetes mellitus (CMS-HCC), Hypertension, Sarcoidosis of lung (CMS-HCC), and Tachycardia (09/2018). Pt presenting with diffuse body pain.    Chart review:  She was last seen by Dr. Juleen China on 10/2020 for initial consult for pharmacotherapy recommendations for her fibromyalgia. Pain at the time located in upper back, left shoulder, lower back bilaterally, lower buttocks bilaterally, into both knees, both wrists, and both hands. Sharp, associated with physical activity. At best, 5/10 severity, worst 9/10 severity, averaging 8/10 severity.    Pharmacotherapy at the time:  - Steroid injection to lower back: did not help  - Acupuncture: helpful  - PT: worsened pain  - Oxycodone: helpful  - Pregabalin: helpful  - Acetaminophen: not helpful  - Previously also been on gabapentin, tramadol, baclofen (stopped for heart fluttering, this was helpful), amitriptyline (helped with sleep)    At the time, she was assessed as having fibromyalgia. Plan was to get neuraxial imaging results from [place]. Elected to proceed with trigger point injection in neck. Deferred SI joint injections for now. Referred to integrative medicine center for acupuncture.     Consider starting duloxetine 60 mg daily, continue pregabalin and percocet 10-325 half tab at current doses. Referral to rheumatologist. Integrative medicine 01/2021: referred to pain psychologist, acupuncture    Today, she feels like the intensity of her pain has gotten worse since the last visit. Feels more stiff and sore throughout her body. Starting to get hard for her to lift  up her right and left (right greater than left) arm above the level of the shoulder. Left hip also feels like it pops out here and there. Also continuing to have low back pain and rib cage pain. This happens every day. Lowest pain level is about 5/10, averages 8-9/10 severity typically.     Wakes up in the morning with redness and swelling in the MCP and PIP joints bilaterally for fingers 2-5, stiffness can last for many hours.    Has not seen rheumatology or acupuncture. Has not tried duloxetine yet. Open to trying medical cannabis. Only major change is that her percocet is now down to 5-325 mg tablets.    Hx     Current pain medications:  - oxycodone-acetaminophen 5-325 1-2 tablets maximum in a single day  - pregabalin 75 mg morning + 150 mg at night - doesn't seem to be helping at all  - sertraline 100 mg    Previous pain medications:  Gabapentin, tramadol, baclofen, amitriptyline    Prior procedures pain treatments:  Prior steroid injections in spine    Past Medical History:   Diagnosis Date   . Diabetes mellitus (CMS-HCC)    . Hypertension    . Sarcoidosis of lung (CMS-HCC)     bilat   . Tachycardia 09/2018       Past Surgical History:   Procedure Laterality Date   . AICD pacemaker N/A 09/2018   . CHOLECYSTECTOMY     . HYSTERECTOMY     .  KNEE ARTHROSCOPY Left        Family History   Problem Relation Name Age of Onset   . Diabetes Mother     . Heart Disease Mother     . Kidney Disease Mother     . Hypertension Father     . Alcohol/Drug Father         Social History     Socioeconomic History   . Marital status: Married   Tobacco Use   . Smoking status: Former   . Smokeless tobacco: Never   Substance and Sexual Activity   . Alcohol use: Not Currently   . Drug use: Never        Allergies   Allergen Reactions   . Sulfa Drugs Anaphylaxis   . Benadryl [Diphenhydramine] Rash and Itching     Per 12/25/20 TE: Do you have any additional known allergies to over the counter or prescription medications? Yes   If yes, please  list additional medication(s) dye codeine on benadryl  and explain reaction(s)itching, rash    . Latex Itching     Per 12/25/20 TE: Are you allergic to latex? Yes   If yes, please explain reaction(s)itchiness    . Macrolides Other     History of QT-C prolongation   . Morphine Itching           Objective     Physical Exam:   Vitals: BP 142/81 (BP Location: Left arm, BP Patient Position: Sitting, BP cuff size: Large)   Pulse 68   Temp 97.8 F (36.6 C) (Temporal)   SpO2 100%     Physical Exam  Constitutional:       General: She is not in acute distress.     Appearance: She is not ill-appearing or toxic-appearing.   Neck:      Comments: Paraspinal tenderness / trigger point tenderness in cervical region  Pulmonary:      Effort: Pulmonary effort is normal. No respiratory distress.      Comments: Portable o2  Musculoskeletal:      Comments: Trigger point tenderness in the thoracic and lumbar spine bilaterally. SI joint tenderness bilaterally    Hands bilateral: no active signs of synovitis   Neurological:      Mental Status: She is alert.      Comments: Normal elbow flexion/extension, grip strength, hip flexion, knee flexion/extension strength in all extremities. Normal sensation to light touch in all extremities       Imaging:  No results found.     Assessment & Plan     Kimberly Curtis is a 66 year old No obstetric history on file. female who  has a past medical history of Diabetes mellitus (CMS-HCC), Hypertension, Sarcoidosis of lung (CMS-HCC), and Tachycardia (09/2018). Pt presenting with fibromyalgia    # Fibromyalgia  Pt presents for f/u of fibromyalgia - unfortunately, she has not yet had the chance to follow up with rheumatology, so we encouraged her to do so, as some of her symptoms might be consistent with RA and might benefit from DMARD initiation. She has not yet tried acupuncture or PT. We discussed that her continued use of Percocet might be contributing to her pain instead of improving it. As a result,  we offered her an evaluation for medical marijuana use which may be able to improve her fibromyalgia and help her to stop opioid use.    - Referred for medical marijuana dosing  - Consider switching from sertraline to duloxetine  - Encouraged the  patient to follow up with rheumatology, acupuncture, and physical therapy    The supervising attending of record is Dr. Juleen China.    Landry Dyke, MD  Internal Medicine, PGY-3

## 2021-08-05 NOTE — Telephone Encounter (Signed)
[facility]     Mupirocin (Bactroban) is not currently included in the Pharmacy Refill Clinic protocols. Re-routing to the responsible staff for processing. Thank you!

## 2021-08-05 NOTE — Progress Notes (Signed)
Attending Note:     Subjective:   I reviewed the history.   Patient interviewed and examined.   This is a 66 year old female who has a past medical history of Diabetes mellitus (CMS-HCC), Hypertension, Sarcoidosis of lung (CMS-HCC), and Tachycardia (09/2018). she  is here today for Pain (All over body pain. Chronic. )    This is a follow-up.    Objective:   I have examined the patient and I concur with the fellow physician/resident physician/medical student exam as documented.   X-Ray Chest Single View    Result Date: 11/21/2020  IMPRESSION: Mild interstitial pulmonary edema.      Assessment and plan reviewed with the fellow physician/resident physician/medical student.   I agree with the fellow physician/resident physician/medical student as documented.     Cures review is consistent with prescribed medications with no evidence of doctor shopping.        Encounter Diagnoses   Name Primary?   . Fibromyalgia Yes       Kimberly Curtis was seen today for pain.    Diagnoses and all orders for this visit:    Fibromyalgia  Overview:  Added automatically from request for surgery [id number]    Orders:  -     Consult/Referral to [facility]        Patient Instructions   Treatment options discsussed    1.  Trial of duloxetine.  Please discuss this with your Primary Care Physician as it would require you to stop you Zoloft.  2. Medical Cannabis.  3. Referral to Rheumatology    Your physician has referred you to Hillery Aldo in the [facility] for medical cannabis dosing. Please call our [facility] at [phone] to schedule your appointment.    Location:  Naturopathic medicine sessions are conducted remotely via a secure, HIPAA-compliant online platform.    Naturopathic medicine is a system that treats the whole person, centered on the healing power of nature. Licensed Advertising account executive (NDs) provide comprehensive assessments that facilitate uncovering and addressing the root  causes of disease. NDs specialize in a broad spectrum of integrative disciplines. Among these therapies are nutritional medicine, functional medicine, botanical medicine, use of vitamins and supplements, mind/body medicine, lifestyle medicine, exercise and physical therapies, medical cannabis dosing and integrative pain management.  The overall approach provides a tailor-made map for helping you achieve your ideal health goals.        I have examined the patient, discussed the findings, reviewed the plan, and answered all questions with the patient.  See the fellow physician/resident physician/medical student note for further details.    Medical Decision Making  Today reviewed CURES, reviewed notes from prior visit(s) with [place] [facility] [facility] and reviewed note(s) from primary care    Total duration of encounter spent in pre-visit (reviewing last visit, reviewing prior Epic notes and reviewing CURES), intra-visit (updating relevant history, performing physical exam, creating a treatment plan and medical discussion with patient), and post-visit (note completion and placing of orders) on the day of the encounter, excluding separately reportable services/procedures: 30 minutes.

## 2021-08-05 NOTE — Patient Instructions (Addendum)
Treatment options discsussed     Trial of duloxetine.  Please discuss this with your Primary Care Physician as it would require you to stop you Zoloft.  Medical Cannabis.  Referral to Rheumatology    Your physician has referred you to Hillery Aldo in the [facility] for medical cannabis dosing. Please call our [facility] at [phone] to schedule your appointment.    Location:  Naturopathic medicine sessions are conducted remotely via a secure, HIPAA-compliant online platform.    Naturopathic medicine is a system that treats the whole person, centered on the healing power of nature. Licensed Advertising account executive (NDs) provide comprehensive assessments that facilitate uncovering and addressing the root causes of disease. NDs specialize in a broad spectrum of integrative disciplines. Among these therapies are nutritional medicine, functional medicine, botanical medicine, use of vitamins and supplements, mind/body medicine, lifestyle medicine, exercise and physical therapies, medical cannabis dosing and integrative pain management.  The overall approach provides a tailor-made map for helping you achieve your ideal health goals.

## 2021-08-06 LAB — RESPIRATORY CULTURE W/GRAM STAIN: Respiratory Culture Result: NORMAL — AB

## 2021-08-07 ENCOUNTER — Encounter (INDEPENDENT_AMBULATORY_CARE_PROVIDER_SITE_OTHER): Payer: Self-pay | Admitting: Student in an Organized Health Care Education/Training Program

## 2021-08-07 ENCOUNTER — Telehealth (INDEPENDENT_AMBULATORY_CARE_PROVIDER_SITE_OTHER): Payer: Self-pay | Admitting: Pulmonary Medicine

## 2021-08-07 DIAGNOSIS — R059 Cough, unspecified: Secondary | ICD-10-CM

## 2021-08-07 DIAGNOSIS — J849 Interstitial pulmonary disease, unspecified: Secondary | ICD-10-CM

## 2021-08-07 NOTE — Telephone Encounter (Signed)
[facility] RN Triage Note:     NON URGENT 24 HOUR ROUTINE SYMPTOM CALL:      Reviewed notes in chart & reached out to patient to inquire about symptoms. Spoke with Kimberly Curtis.     Assessment & Symptom Review:    SOB: yes   At Rest: denies   With exertion: mild  Cough: yes  Productive: yes  Color of Sputum: green (chronic)  Chest Congestion: yes  Sinus Congestion: yes  Runny/stuffy nose: yes   Sore Throat:  denies  Ear pressure/pain:  denies  Headache: denies  Wheezing: denies  Chest Pain: denies  Other Pain: denies  Fever: denies  Chills: some on/off  Night Sweats:denies    Nausea/Vomiting: denies  Diarrhea: light diarrhea yesterday, regular today   Fatigue: denies  Appetite:denies  Lightheaded: denies  Dizzy: denies  Other symptoms: denies  Oxygen:   3L at baseline  Sick contacts: denies    Onset of symptoms: weeks    Current treatments:   Doxycycline - still on it  Amoxicillin - still on it  Prednisone last day today  Trelegy daily  Albuterol - every othe rday 1-2 times a day  Oxygen flow 3L,   pepcid  Allegra  Singulair  promethasize DM - not helping  Mucinex using both     Summary:  Patient with express care visit on 08/02/21, sent home with doxycyline, amoxicillin, prednisone.  States she does not feel better at this time, still coughing and producing green phlegm, has developed upper back pain, muscle spasm in lower back.  Baseline green phlegm turned a little darker 2-3 weeks ago.  Pharmacy did not provide Tussionex as they were out of supply, provided promethazine DM, and using OTC mucinex as well without improvement.  Had a few episodes of diarrhea yesterday, normal BM today. On/off chills.      Patient reports 4 weeks ago, had one nosebleed episodes of duration 15 minutes.  Has been blowing her nose and seeing on tissue both red and dry blood since then.  Denies spitting out blood or tasting blood.  Does not regularly get nosebleeds in the past.     Recommendation:  Keep monitoring symptoms, use  all medication as prescribed, strict [place]/ED precautions for acute symptoms, will route to provider for review.      [facility] [address], [place] [address]         Theodoro Kalata BSN, RN   [facility]   Office:  [phone]

## 2021-08-07 NOTE — Telephone Encounter (Signed)
Is this an established patient Yes    What are the symptoms? Coughing badly. Pain in upper backside. Body aches. Pt was diagnosed with Staff infection in her lungs the patient is coughing up green stuff    Pain level 0-10 -8    When did the symptoms start? 3-4 weeks ago    Where is the pain located? Upper back    Is there bleeding? When she blows her nose    Best Call Back # [phone]    Is it OK to leave a voicemail? Yes

## 2021-08-08 ENCOUNTER — Encounter (INDEPENDENT_AMBULATORY_CARE_PROVIDER_SITE_OTHER): Payer: Self-pay

## 2021-08-08 ENCOUNTER — Encounter (INDEPENDENT_AMBULATORY_CARE_PROVIDER_SITE_OTHER): Payer: Self-pay | Admitting: Student in an Organized Health Care Education/Training Program

## 2021-08-08 DIAGNOSIS — G8929 Other chronic pain: Secondary | ICD-10-CM

## 2021-08-08 DIAGNOSIS — M797 Fibromyalgia: Secondary | ICD-10-CM

## 2021-08-08 MED ORDER — MUPIROCIN 2 % EX OINT
1.00 | TOPICAL_OINTMENT | Freq: Two times a day (BID) | CUTANEOUS | 1 refills | Status: DC
Start: 2021-08-08 — End: 2021-09-23

## 2021-08-08 NOTE — Telephone Encounter (Signed)
From: Melodye Martine  To: [name], MD  Sent: 08/08/2021 9:12 AM PDT  Subject: Referral for Rheumatology     Can you please put in a referral for rheumatology please they are waiting for it so I can make my appointment Thank you very much

## 2021-08-08 NOTE — Telephone Encounter (Signed)
Returned call to patient, no answer at this time, left call back voicemail message for Kimberly Curtis at number [phone]. Also in message, informed Dr. Cletis Media recommends completing a sputum sample for culture at this time, would consider stronger antibiotic if symptoms persist.  Recc complete sputum sample lab wok, and to call back Curtis in a few days to report update.

## 2021-08-08 NOTE — Telephone Encounter (Signed)
From: Kimberly Curtis  To: Kimberly Balint, MD  Sent: 08/07/2021 4:03 PM PDT  Subject: Sputum results      I was in express care last Friday and they did a sputum culture and it tested positive for Staphylococcus and I'm still having trouble with coughing and feeling bad my right side up by my lungs is hurting and I'm having muscle spasms in my back as well I have called Dr Janee Morn office and I can't get in any sooner to see him but I'm waiting on them to call me back what should I do I'm still taking the antibiotics

## 2021-08-08 NOTE — Telephone Encounter (Signed)
I have attempted to contact this patient by phone with the following results: called patient in regard to my chart message, no answer, left voicemail to return call to clinic, phone number included..  Mychart message also sent to pt.     CALL CENTER:  When patient calls back:  Any RN can take the call

## 2021-08-09 ENCOUNTER — Encounter (HOSPITAL_BASED_OUTPATIENT_CLINIC_OR_DEPARTMENT_OTHER): Payer: Self-pay | Admitting: Hospital

## 2021-08-09 ENCOUNTER — Telehealth (INDEPENDENT_AMBULATORY_CARE_PROVIDER_SITE_OTHER): Payer: Self-pay | Admitting: Internal Medicine

## 2021-08-09 NOTE — Telephone Encounter (Signed)
Mychart

## 2021-08-09 NOTE — Telephone Encounter (Signed)
Closing triage encounter, unable to reach patient via telephone and MyChart messaging.     If further follow up is needed, please route to clinic MA/LVN.  Thank you     Routing to PCP as FYI per protocol.

## 2021-08-28 ENCOUNTER — Other Ambulatory Visit
Payer: No Typology Code available for payment source | Attending: Student in an Organized Health Care Education/Training Program | Admitting: Student in an Organized Health Care Education/Training Program

## 2021-08-28 ENCOUNTER — Encounter (INDEPENDENT_AMBULATORY_CARE_PROVIDER_SITE_OTHER): Payer: Self-pay | Admitting: Student in an Organized Health Care Education/Training Program

## 2021-08-28 ENCOUNTER — Encounter (HOSPITAL_COMMUNITY): Payer: Self-pay

## 2021-08-28 ENCOUNTER — Encounter (INDEPENDENT_AMBULATORY_CARE_PROVIDER_SITE_OTHER): Payer: Self-pay | Admitting: Hospital

## 2021-08-28 ENCOUNTER — Encounter (INDEPENDENT_AMBULATORY_CARE_PROVIDER_SITE_OTHER): Payer: Self-pay

## 2021-08-28 VITALS — BP 116/71 | HR 82 | Temp 98.0°F | Resp 16 | Wt 215.0 lb

## 2021-08-28 DIAGNOSIS — G894 Chronic pain syndrome: Secondary | ICD-10-CM

## 2021-08-28 DIAGNOSIS — M25561 Pain in right knee: Secondary | ICD-10-CM

## 2021-08-28 DIAGNOSIS — Z1239 Encounter for other screening for malignant neoplasm of breast: Secondary | ICD-10-CM

## 2021-08-28 DIAGNOSIS — E1143 Type 2 diabetes mellitus with diabetic autonomic (poly)neuropathy: Secondary | ICD-10-CM | POA: Insufficient documentation

## 2021-08-28 DIAGNOSIS — D869 Sarcoidosis, unspecified: Secondary | ICD-10-CM

## 2021-08-28 DIAGNOSIS — M797 Fibromyalgia: Secondary | ICD-10-CM

## 2021-08-28 DIAGNOSIS — Z794 Long term (current) use of insulin: Secondary | ICD-10-CM

## 2021-08-28 DIAGNOSIS — R053 Chronic cough: Secondary | ICD-10-CM

## 2021-08-28 DIAGNOSIS — G8929 Other chronic pain: Secondary | ICD-10-CM

## 2021-08-28 DIAGNOSIS — M25562 Pain in left knee: Secondary | ICD-10-CM

## 2021-08-28 DIAGNOSIS — M25552 Pain in left hip: Secondary | ICD-10-CM | POA: Insufficient documentation

## 2021-08-28 LAB — RANDOM URINE MICROALB/CREAT RATIO PANEL
Creatinine, Urine: 270 mg/dL — ABNORMAL HIGH (ref 29–226)
MALB/CR Ratio Random: 5 mcg/mgCr (ref <30)
Microalbumin, Urine: 1.4 mg/dL (ref <2.0)

## 2021-08-28 MED ORDER — OXYCODONE-ACETAMINOPHEN 5-325 MG OR TABS
1.0000 | ORAL_TABLET | Freq: Four times a day (QID) | ORAL | 0 refills | Status: DC | PRN
Start: 2021-08-28 — End: 2021-09-04

## 2021-08-28 NOTE — Assessment & Plan Note (Signed)
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  [facility] 02[facility]  Reviewed recent EC visit, sputum cx grew heavy staff and completed dual abx course  CTM for now, pending pulm visit

## 2021-08-28 NOTE — Progress Notes (Signed)
Internal Medicine Clinic Progress Note       Chief Complaint   Patient presents with   . Follow Up     Cough had sputum test done with staph infection        Subjective:      Kimberly Curtis is a 66 year old female here for follow-up.    Presents with Husband [name] today    Saw pain specialist  Recommended Cymbalta  This was discussed already in the past but patient declined due to potential risk for arrythmia  She did not want to start that medication because of this  Discussed medical cannabis, planning to start once dose is clarified    Chronic cough persists  She was seen in [facility] 5/12 and empirically treated with doxycyline along with cefpodoxime and a steroid course  Sputum culture grew heavy staph  Felt better but not entirely better  Denies FC    History:  Sarcoidosisc/b frequent PNA/bronchitis s/p bronchial stent-previously onchronic steroids,tapered offPrednisone 10 mg,Albuterol neb PRN, MDI uses more frequently, montelukast 10 mg daily, Trelegy daily. Had bronchial stents recently taken out on 04/30/2020 in [place]. Last CT scan 04/2020.[facility], [facility] CT [facility].On 3L 02continuous  Chronic coughcausingB/l abd/thoracic pain-intermittent. Previously prescribed Tussionex  T2DMw/peripheralneuropathies-last A1c7.2%(03/2021), UACR UTC(06/2020). On Lantus 48, Novolog 20-17-12TID plusISS,(stoppedJardiance 25 mg dailydue to yeast infections, stopped glipizide, metformin cause GI issues). Last eye exam 03/27/2021. Endo/CDE referral(placed 06/2020).  Hx ofVFIB/TACH/prolonged QTC-s/p DC AICD placement 7/16-7/20/22 at [facility], following with Cardiology at [facility]  HTN-onMetoprolol 50 BID,Losartan50daily. Checks BP at home, 130/70sand exacerbated with underlying pain  HLD-onLipitor 40 per  GERD-Protonix 40 mg daily, Pepcid 40 daily. Pending GI f/u (placed 08/2020 - has not scheduled)  Nausea, GI upset, chronic-pending gastric emptying study, stopped Reglan due to QTC. Takes Zofran  PRN  Diverticulosis-colonoscopy in 2019  Obesity, BMI36  Chronic pain, OA of knees/back/hands/knees, sarcoid pain-on Lyrica 75/150, Percocet 10PRN(takes 0.5 as needed for breakthrough). StoppedGabapentin,Tramadol, Baclofen,and Amitryptaline due to qtc. Has Narcan at home, never used. Previously seeing pain specialist in [place]. Following with Pain medicine  Anxiety/MDD/insomnia-following with Psychiatry. On Sertraline 100 and mirtazapine 30   Allergies-Allegra 180 daily  Itching-Hydroxyzine 50 mg PRN(not using)  S/p hysterectomy-right ovary intact, done in 1996  S/p L-knee surgery-2003, torn meniscus, ACL repair     Social Hx:  Retired, previous [employer] disability. Has 4 children. Stopped tobacco in 1996 (10 pack year history). Denies alcohol use.    Assessment and Plan:     Problem List Items Addressed This Visit        Endocrine    Type 2 diabetes mellitus with diabetic autonomic neuropathy, with long-term current use of insulin (CMS-HCC)    Current Assessment & Plan     T2DMw/peripheralneuropathies-last A1c7.2%(03/2021), UACR UTC(06/2020). On Lantus 48, Novolog 20-17-12TID plusISS,(stoppedJardiance 25 mg dailydue to yeast infections, stopped glipizide, metformin cause GI issues). Last eye exam 03/27/2021.  -Continue current regimen  -Cont ARB/statin   -Continue annual eye exams  -F/u as planned         Relevant Orders    Random Urine Microalb/Creat Ratio Panel       Other    Sarcoidosis    Relevant Medications    oxyCODONE-acetaminophen (PERCOCET) 5-325 MG tablet    Chronic pain syndrome    Relevant Medications    oxyCODONE-acetaminophen (PERCOCET) 5-325 MG tablet    Chronic pain of both knees    Relevant Medications    oxyCODONE-acetaminophen (PERCOCET) 5-325 MG tablet  Other Relevant Orders    Consult to [facility] - [facility]    Fibromyalgia    Current Assessment & Plan     Chronic FM pain, w/ OA of knees/back, sarcoid pain  On Lyrica 75/150, Percocet 10 PRN (take 1 pill daily  or every other day), Tylenol regular.   Previous meds: Gabapentin,Tramadol, Baclofen, and Amitryptaline   Declined Cymbalta due to risk for arrhythmia  Planning to start medical cannabis.   Saw Pain medicine at [facility], pending Rheumatology visit  -Voltaren/Lidocaine PRN  -Cont current regimen  -Percocet 10 for SEVERE PAIN, refilled. Discussed that this will not be uses as a long term treatment  -Has Narcan at home, never used  -PT  -CURES reviewed     I ran the patient's name and date-of-birth through the [place] [employer] website which will list controlled drug prescriptions filled at [facility] in the past 12 months. The information in the patient's CURES report is consistent with their self-report and shows no evidence of controlled substance abuse or doctor shopping.         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  [facility] On 3L 02continuous  Reviewed recent EC visit, sputum cx grew heavy staff and completed dual abx course  CTM for now, pending pulm visit        Other Visit Diagnoses     Encounter for screening for malignant neoplasm of breast, unspecified screening modality        Relevant Orders    Screening Mammogram With Digital Breast Tomosynthesis - Bilateral          Medication Review:  Medications reviewed with patient and medication list  reconciled.  Over the counter medications, herbal therapies and supplements reviewed.  Patient's understanding and response to medications assessed.   Barriers to medications assessed and addressed.   Risks, benefits, alternatives to medications reviewed.  No barriers to learning, verbalizes understanding of teaching and instructions.    Medications at the end of this encounter:  Current Outpatient Medications   Medication Sig Dispense Refill   . albuterol (PROVENTIL) (2.5 MG/3ML) 0.083% nebulization 3 mL (2.5 mg) by Nebulization route every 4 hours as needed for Wheezing. 1 each 2   . albuterol 108 (90 Base) MCG/ACT inhaler Inhale 2 puffs by mouth every 6 hours as needed for Wheezing. 1 each 0   . Albuterol Sulfate 108 (90 Base) MCG/ACT AEPB Inhale 1 puff by mouth as needed.     . ASPIRIN LOW DOSE 81 MG EC tablet TAKE 1 TABLET BY MOUTH EVERY DAY 120 tablet 1   . atorvastatin (LIPITOR) 40 MG tablet Take 1 tablet (40 mg) by mouth daily. 90 tablet 1   . Continuous Blood Gluc Receiver (CONTINUOUS BLOOD GLUCOSE, DEXCOM G6, RECEIVER) Use daily to read glucose  topically 1 each 0   . Continuous Blood Gluc Sensor (CONTINUOUS BLOOD GLUCOSE, DEXCOM G6, SENSOR) Insert subcutaneously every 10 days. 10 each 4   . Continuous Blood Gluc Transmit (CONTINUOUS BLOOD GLUCOSE, DEXCOM G6, TRANSMITTER) Use daily to transmit subcutaneous glucose value. Change every 3 months. 1 each 4   . famotidine (PEPCID) 40 MG tablet Take 1 tablet (40 mg) by mouth daily. 90 tablet 1   . fexofenadine (ALLEGRA) 180 MG tablet Take 1 tablet (180 mg) by mouth daily. 90 tablet 1   . fluticasone-umeclidinium-vilanterol (TRELEGY ELLIPTA) 100-62.5-25 MCG/INH AEPB Inhale 1 puff by mouth daily.     . folic acid (FOLVITE) 1 MG tablet TAKE 1 TABLET BY MOUTH EVERY DAY 90 tablet 1   . hydrOXYzine HCL (ATARAX) 50 MG tablet Take 1 tablet (50 mg) by mouth 2 times daily as needed for Itching. 180 tablet 3   . Insulin Glargine Solostar (LANTUS SOLOSTAR) 100 UNIT/ML SOPN  Inject 48 Units under the skin nightly. 15 mL 2   . insulin lispro, 1 Unit Dial, (HUMALOG KWIKPEN) 100 units/mL INJECT 20 UNITS UNDER THE SKIN 3 TIMES DAILY AS NEEDED FOR HIGH BLOOD SUGAR. IN THE MORNING 20 UNITS + SLIDING SCALE MID DAY: 17 + SLIDING SCALE AND IN THE EVENING 12 + SLIDING SCALE 15 mL 11   . losartan (COZAAR) 50 MG tablet Take 1 tablet (50 mg) by mouth daily. 90 tablet 2   . metoprolol tartrate (LOPRESSOR) 50 MG tablet Take 1 tablet (50 mg) by mouth 2 times daily. 180 tablet 3   . mirtazapine (REMERON) 30 MG tablet TAKE 1 TABLET BY MOUTH NIGHTLY 90 tablet 1   . montelukast (SINGULAIR) 10 MG tablet Take 1 tablet (10 mg) by mouth every evening. 90 tablet 3   . mupirocin (BACTROBAN) 2 % ointment APPLY 1 APPLICATION TOPICALLY 2 TIMES DAILY. USE A SMALL AMOUNT AS DIRECTED 22 g 1   . naloxone (NARCAN) 4 mg/0.1 mL nasal spray Spray 1 spray into one nostril once as needed.     Marland Kitchen NOVOLOG FLEXPEN 100 UNIT/ML injection pen INJECT 20 UNITS UNDER THE SKIN 3 TIMES DAILY AS NEEDED FOR HIGH BLOOD SUGAR. IN THE MORNING 20 UNITS + SLIDING SCALE MID DAY: 17 + SLIDING SCALE AND IN THE EVENING 12 + SLIDING SCALE 15 each 11   . ondansetron (ZOFRAN ODT) 8 MG disintegrating tablet TAKE 1 TABLET ON OR UNDER THE TONGUE EVERY 8 HOURS AS NEEDED FOR NAUSEA/VOMITING. 30 tablet 2   . oxyCODONE-acetaminophen (PERCOCET) 5-325 MG tablet Take 1 tablet by mouth every 6 hours as needed for Severe Pain (Pain Score 7-10). 60 tablet 0   . pantoprazole (PROTONIX) 40 MG tablet Take 1 tablet (40 mg) by mouth every morning (before breakfast). 90 tablet 3   . pregabalin (LYRICA) 75 MG capsule TAKE 1 CAPSULE BY MOUTH EVERY MORNING AND 2 CAPSULES EVERY EVENING 90 capsule 2   . promethazine-dextromethorphan (PROMETHAZINE-DM) 6.25-15 MG/5ML syrup Take 5 mL by mouth every 6 hours as needed for Cough. 118 mL 0   . sertraline (ZOLOFT) 100 MG tablet Take 1 tablet in the morning 90 tablet 1   . TECHLITE PEN NEEDLES 32G X 4 MM MISC USE ONE PEN NEEDLE WITH  EACH INSULIN ADMINISTRATION. 200 each 11     No current facility-administered medications for this visit.       Objective:     Vitals:    08/28/21 1306 08/28/21 1353   BP: 127/85 116/71   BP Location:  Right arm Right arm   BP Patient Position: Sitting Sitting   BP cuff size: Large Large   Pulse: 82    Resp: 16    Temp: 98 F (36.7 C)    TempSrc: Temporal    SpO2: 100%    Weight: 97.5 kg (215 lb)        Labs and Imaging:     Lab Results   Component Value Date    WBC 9.3 11/21/2020    RBC 4.13 11/21/2020    HGB 10.2 (L) 11/21/2020    HCT 35.6 11/21/2020    MCV 86.2 11/21/2020    MCHC 28.7 (L) 11/21/2020    RDW 17.0 (H) 11/21/2020    PLT 251 11/21/2020    MPV 11.1 11/21/2020       Lab Results   Component Value Date    BUN 15 11/21/2020    CREAT 0.97 (H) 11/21/2020    CL 103 11/21/2020    NA 142 11/21/2020    K 4.2 11/21/2020    [place] 9.4 11/21/2020    TBILI 0.21 11/21/2020    ALB 4.1 11/21/2020    TP 6.6 11/21/2020    AST 15 11/21/2020    ALK 138 (H) 11/21/2020    BICARB 30 (H) 11/21/2020    ALT 26 11/21/2020    GLU 237 (H) 11/21/2020       No results found.      Followup:       No follow-ups on file.    Future Appointments   Date Time Provider [place]   08/28/2021  1:20 PM [name], MD [facility]   09/04/2021 11:20 AM Hillery Aldo, ND [facility]   10/04/2021  9:00 AM Derwood Kaplan, MD [facility]   10/21/2021 11:30 AM Eda Keys, MD [id number]               [name], MD  [place]  Internal Medicine

## 2021-08-28 NOTE — Assessment & Plan Note (Addendum)
Chronic FM pain, w/ OA of knees/back, sarcoid pain  On Lyrica 75/150, Percocet 10 PRN (take 1 pill daily or every other day), Tylenol regular.   Previous meds: Gabapentin,Tramadol, Baclofen, and Amitryptaline   Declined Cymbalta due to risk for arrhythmia  Planning to start medical cannabis.   Saw Pain medicine at [place], pending Rheumatology visit  -Voltaren/Lidocaine PRN  -Cont current regimen  -Percocet 10 for SEVERE PAIN, refilled. Discussed that this will not be uses as a long term treatment  -Has Narcan at home, never used  -PT  -CURES reviewed     I ran the patient's name and date-of-birth through the [place] Department of [place] website which will list controlled drug prescriptions filled at [facility] in the past 12 months. The information in the patient's CURES report is consistent with their self-report and shows no evidence of controlled substance abuse or doctor shopping.

## 2021-08-28 NOTE — Assessment & Plan Note (Signed)
T2DMw/peripheralneuropathies-last A1c7.2%(03/2021), UACR UTC(06/2020). On Lantus 48, Novolog 20-17-12TID plusISS,(stoppedJardiance 25 mg dailydue to yeast infections, stopped glipizide, metformin cause GI issues). Last eye exam 03/27/2021.  -Continue current regimen  -Cont ARB/statin   -Continue annual eye exams  -F/u as planned

## 2021-08-30 NOTE — Telephone Encounter (Signed)
From: Rella Haese  To: [name], MD  Sent: 08/28/2021 4:46 PM PDT  Subject: Oxycodone refill     I just left the doctors office and he was suppose to send in a prescription for the oxycodone but they have OT received it yet cans you please send it in thank you

## 2021-09-01 ENCOUNTER — Encounter (INDEPENDENT_AMBULATORY_CARE_PROVIDER_SITE_OTHER): Payer: Self-pay | Admitting: Student in an Organized Health Care Education/Training Program

## 2021-09-01 DIAGNOSIS — E119 Type 2 diabetes mellitus without complications: Secondary | ICD-10-CM

## 2021-09-03 ENCOUNTER — Encounter (INDEPENDENT_AMBULATORY_CARE_PROVIDER_SITE_OTHER): Payer: Self-pay | Admitting: Student in an Organized Health Care Education/Training Program

## 2021-09-03 DIAGNOSIS — M797 Fibromyalgia: Secondary | ICD-10-CM

## 2021-09-03 DIAGNOSIS — G894 Chronic pain syndrome: Secondary | ICD-10-CM

## 2021-09-03 MED ORDER — INSULIN LISPRO (1 UNIT DIAL) 100 UNIT/ML SC SOPN
PEN_INJECTOR | SUBCUTANEOUS | 11 refills | Status: DC
Start: 2021-09-03 — End: 2021-12-12

## 2021-09-03 NOTE — Telephone Encounter (Signed)
From: Kimberly Curtis  To: [name], MD  Sent: 09/01/2021 4:09 PM PDT  Subject: Humalog refill    I forgot to let you know last week that I needed more than one box on the Humalog I ran out last month and I had to do without for a couple of days and with the Lantus I'm up to 52 units my glucose has been running high since I got off of the steroids so the Lantus I only have about 40 units for my last injection before I get my new prescription

## 2021-09-04 ENCOUNTER — Encounter (INDEPENDENT_AMBULATORY_CARE_PROVIDER_SITE_OTHER): Payer: Self-pay | Admitting: Family Practice

## 2021-09-04 ENCOUNTER — Telehealth (INDEPENDENT_AMBULATORY_CARE_PROVIDER_SITE_OTHER): Payer: No Typology Code available for payment source | Admitting: Naturopathy

## 2021-09-04 ENCOUNTER — Other Ambulatory Visit: Payer: Self-pay

## 2021-09-04 ENCOUNTER — Telehealth (INDEPENDENT_AMBULATORY_CARE_PROVIDER_SITE_OTHER): Payer: No Typology Code available for payment source | Admitting: Family Practice

## 2021-09-04 DIAGNOSIS — G894 Chronic pain syndrome: Secondary | ICD-10-CM

## 2021-09-04 DIAGNOSIS — M797 Fibromyalgia: Secondary | ICD-10-CM

## 2021-09-04 DIAGNOSIS — G479 Sleep disorder, unspecified: Secondary | ICD-10-CM

## 2021-09-04 DIAGNOSIS — E1042 Type 1 diabetes mellitus with diabetic polyneuropathy: Secondary | ICD-10-CM

## 2021-09-04 DIAGNOSIS — M1990 Unspecified osteoarthritis, unspecified site: Secondary | ICD-10-CM

## 2021-09-04 MED ORDER — OXYCODONE-ACETAMINOPHEN 10-325 MG OR TABS
0.5000 | ORAL_TABLET | Freq: Four times a day (QID) | ORAL | 0 refills | Status: DC | PRN
Start: 2021-09-04 — End: 2021-09-12

## 2021-09-04 NOTE — Progress Notes (Signed)
Patient Verification & Telemedicine Consent:    I am proceeding with this evaluation at the direct request of the patient.  I have verified this is the correct patient and have obtained verbal consent and written consent from the patient/ surrogate to perform this voluntary telemedicine evaluation (including obtaining history, performing examination and reviewing data provided by the patient).   The patient/ surrogate has the right to refuse this evaluation.  I have explained risks (including potential loss of confidentiality), benefits, alternatives, and the potential need for subsequent face to face care. Patient/ surrogate understands that there is a risk of medical inaccuracies given that our recommendations will be made based on reported data (and we must therefore assume this information is accurate).  Knowing that there is a risk that this information is not reported accurately, and that the telemedicine video, audio, or data feed may be incomplete, the patient agrees to proceed with evaluation and holds [place] harmless knowing these risks. In this evaluation, we will be providing recommendations only. The patient/ surrogate has been notified that other healthcare professionals (including students, residents and Metallurgist) may be involved in this audio-video evaluation.   All laws concerning confidentiality and patient access to medical records and copies of medical records apply to telemedicine.  The patient/ surrogate has received the [place] Notice of Privacy Practices.  I have reviewed this above verification and consent paragraph with the patient/ surrogate.  If the patient is not capacitated to understand the above, and no surrogate is available, since this is not an emergency evaluation, the visit will be rescheduled until such time that the patient can consent, or the surrogate is available to consent.    Referring Provider:  Breck Curtis    For:      she  is here today for Pain  (All over body pain. Chronic. )  She has widespread pain      History of Illness:  Frequency:  Descriptors: shoulder, elbows finer, hands, all joints + fibromyalgia diagnosis (x6 years)  Better:  The opioids help but she alternates with the cannabis  Worse:  Severity in the past week:   Associated SX:  Other: she tried a cannabis gummy.  She tries not to use the opioid at all  Her sleep is greatly disrupted    Diagnostic History:   a past medical history of Diabetes mellitus (CMS-HCC), Hypertension, Sarcoidosis of lung (CMS-HCC), and Tachycardia (09/2018).     PROMIS Physical Function T-Score (range: 10 - 90) 23 (severe dysfunction)   PROMIS Anxiety T-Score (range: 10 - 90) 61 (moderate)   PROMIS Depression T-Score (range: 10 - 90) 52 (within normal limits)   PROMIS Fatigue T-Score (range: 10 - 90) 67 (moderate)   PROMIS Sleep Disturbance T-Score (range: 10 - 90) 64 (moderate)   PROMIS Ability to Participate in Social Roles & Activities T-Score (range: 10 - 90) 39 (moderate dysfunction)   PROMIS Pain Interference T-Score (range: 10 - 90) 76 (severe)   PROMIS Pain Intensity (range: 1 - 10) 8       Personal/social History:  Social History     Socioeconomic History   . Marital status: Married   Tobacco Use   . Smoking status: Former   . Smokeless tobacco: Never   Substance and Sexual Activity   . Alcohol use: Not Currently   . Drug use: Never     Relevant Family History:  Family History   Problem Relation Name Age of Onset   . Diabetes  Mother     . Heart Disease Mother     . Kidney Disease Mother     . Hypertension Father     . Alcohol/Drug Father       Allergies:  Allergies   Allergen Reactions   . Sulfa Drugs Anaphylaxis   . Benadryl [Diphenhydramine] Rash and Itching     Per 12/25/20 TE: Do you have any additional known allergies to over the counter or prescription medications? Yes   If yes, please list additional medication(s) dye codeine on benadryl  and explain reaction(s)itching, rash    . Latex Itching     Per  12/25/20 TE: Are you allergic to latex? Yes   If yes, please explain reaction(s)itchiness    . Macrolides Other     History of QT-C prolongation   . Morphine Itching     Current Medications:   Current Outpatient Medications   Medication Sig   . albuterol (PROVENTIL) (2.5 MG/3ML) 0.083% nebulization 3 mL (2.5 mg) by Nebulization route every 4 hours as needed for Wheezing.   Marland Kitchen albuterol 108 (90 Base) MCG/ACT inhaler Inhale 2 puffs by mouth every 6 hours as needed for Wheezing.   . Albuterol Sulfate 108 (90 Base) MCG/ACT AEPB Inhale 1 puff by mouth as needed.   . ASPIRIN LOW DOSE 81 MG EC tablet TAKE 1 TABLET BY MOUTH EVERY DAY   . atorvastatin (LIPITOR) 40 MG tablet Take 1 tablet (40 mg) by mouth daily.   . Continuous Blood Gluc Receiver (CONTINUOUS BLOOD GLUCOSE, DEXCOM G6, RECEIVER) Use daily to read glucose topically   . Continuous Blood Gluc Sensor (CONTINUOUS BLOOD GLUCOSE, DEXCOM G6, SENSOR) Insert subcutaneously every 10 days.   . Continuous Blood Gluc Transmit (CONTINUOUS BLOOD GLUCOSE, DEXCOM G6, TRANSMITTER) Use daily to transmit subcutaneous glucose value. Change every 3 months.   . famotidine (PEPCID) 40 MG tablet Take 1 tablet (40 mg) by mouth daily.   . fexofenadine (ALLEGRA) 180 MG tablet Take 1 tablet (180 mg) by mouth daily.   . fluticasone-umeclidinium-vilanterol (TRELEGY ELLIPTA) 100-62.5-25 MCG/INH AEPB Inhale 1 puff by mouth daily.   . folic acid (FOLVITE) 1 MG tablet TAKE 1 TABLET BY MOUTH EVERY DAY   . hydrOXYzine HCL (ATARAX) 50 MG tablet Take 1 tablet (50 mg) by mouth 2 times daily as needed for Itching.   . Insulin Glargine Solostar (LANTUS SOLOSTAR) 100 UNIT/ML SOPN Inject 48 Units under the skin nightly.   . insulin lispro, 1 Unit Dial, (HUMALOG KWIKPEN) 100 units/mL INJECT 20 UNITS UNDER THE SKIN 3 TIMES DAILY AS NEEDED FOR HIGH BLOOD SUGAR. IN THE MORNING 20 UNITS + SLIDING SCALE MID DAY: 17 + SLIDING SCALE AND IN THE EVENING 12 + SLIDING SCALE   . losartan (COZAAR) 50 MG tablet Take 1  tablet (50 mg) by mouth daily.   . metoprolol tartrate (LOPRESSOR) 50 MG tablet Take 1 tablet (50 mg) by mouth 2 times daily.   . mirtazapine (REMERON) 30 MG tablet TAKE 1 TABLET BY MOUTH NIGHTLY   . montelukast (SINGULAIR) 10 MG tablet Take 1 tablet (10 mg) by mouth every evening.   . mupirocin (BACTROBAN) 2 % ointment APPLY 1 APPLICATION TOPICALLY 2 TIMES DAILY. USE A SMALL AMOUNT AS DIRECTED   . naloxone (NARCAN) 4 mg/0.1 mL nasal spray Spray 1 spray into one nostril once as needed.   Marland Kitchen NOVOLOG FLEXPEN 100 UNIT/ML injection pen INJECT 20 UNITS UNDER THE SKIN 3 TIMES DAILY AS NEEDED FOR HIGH BLOOD SUGAR. IN THE MORNING  20 UNITS + SLIDING SCALE MID DAY: 17 + SLIDING SCALE AND IN THE EVENING 12 + SLIDING SCALE   . ondansetron (ZOFRAN ODT) 8 MG disintegrating tablet TAKE 1 TABLET ON OR UNDER THE TONGUE EVERY 8 HOURS AS NEEDED FOR NAUSEA/VOMITING.   Marland Kitchen oxyCODONE-acetaminophen (PERCOCET) 5-325 MG tablet Take 1 tablet by mouth every 6 hours as needed for Severe Pain (Pain Score 7-10).   . pantoprazole (PROTONIX) 40 MG tablet Take 1 tablet (40 mg) by mouth every morning (before breakfast).   . pregabalin (LYRICA) 75 MG capsule TAKE 1 CAPSULE BY MOUTH EVERY MORNING AND 2 CAPSULES EVERY EVENING   . promethazine-dextromethorphan (PROMETHAZINE-DM) 6.25-15 MG/5ML syrup Take 5 mL by mouth every 6 hours as needed for Cough.   . sertraline (ZOLOFT) 100 MG tablet Take 1 tablet in the morning   . TECHLITE PEN NEEDLES 32G X 4 MM MISC USE ONE PEN NEEDLE WITH EACH INSULIN ADMINISTRATION.     No current facility-administered medications for this visit.    She said the Remeron used to work and now doesn't seem to.  She takes it every other night.  She stopped taking the zoloft.  Thinks the lyrica helps a small amount    Supplements:  Vitamin d  Garlic pill   Used melatonin one time  Cannabis cannabis Elderberry gummy:  Takes 1/2 5 mg THC  Taken a whole 10 mg THC/CBN. She takes this 2 hours prior to going to bed.  3) Describe the potency  of the cannabis (% of THC) you currently are using:  5-10% THC   4) If currently using oral cannabis (liquid, gummy etc.)? How many times per day do you use oral cannabis 1   Describe the approximate milligrams of THC per dose that you take, if known: 5 mg   5) Do you use a hemp-based CBD product? No   Do you find it to be effective? Yes/no Yes   6) Do you use a topical cannabis or hemp product? Yes,/no No   7) Have you decreased the use of any prescription medications as a result of using cannabis? Yes/no Yes   8) If yes, which class of drug(s): Pain medication   Dizziness Yes   Anxiety Yes   Desire to use more cannabis or to use it more often  No   Any other drug effects (0-10 VAS)          Exercise:  She is not having much activity.  She is going to go to PT      Sleep:  Falling Asleep: difficulty falling sleep due to pain and maybe some anxiety.  4-5 am when falling asleep.  Staying Asleep:she does not stay alseep  Up per night:  Up and and down to toilet.  Pain wakes her  Remembers Dreams: no  Total sleep time: 4-5 hrs per night  Waking Rested: no  Daytime sleepiness;  .        ROS  Constitutional: positive for: fatigue, night sweats, she has gained a lot of  Weight in the last few years- was on steroids for two years.  She recently l ost about 60 lb in the last 6-7 months.   Eyes: negative for: blurry vision, visual loss, red eyes, eye pain.   CV: She has a pacemaker  Resp: negative for: cough, shortness of breath, pleuritic pain, wheezing, DOE  GI: positive for nausea sometimes worse in the morning. She has not been vomiting.   GU:  Positive for  nocturia 2x  Reproductive:   Positive for hot flashes during the day and worse at night. She can wait in a full sweat. She keeps a fan on her  Allergy/Immun: positive for: stuffy nose and sinuses, itchy/red/tearing eyes. She can't use benadryl.  Sometimes she gets hives and has to go to the ED.    Physical Exam:   There is no PE due to the telehealth nature of the  visit    Diagnosis:  Sarcoidosis  Diabetic neruopathy  Fibromyalgia  OA (knee)    Assessment  Kimberly Curtis is a 66 YO  female  who  has a past medical history of   Patient Active Problem List   Diagnosis   . Type 2 diabetes mellitus with diabetic autonomic neuropathy, with long-term current use of insulin (CMS-HCC)   . Sarcoidosis   . Chronic pain syndrome   . Recurrent falls   . Chronic pain of both knees   . Gastroesophageal reflux disease, unspecified whether esophagitis present   . Nausea   . Essential hypertension   . Itching   . Mixed hyperlipidemia   . Anxiety   . Allergy, initial encounter   . S/P placement of cardiac pacemaker   . Vitamin D deficiency   . Fibromyalgia   . Chronic pain of both shoulders   . Bilateral hip pain   . Chronic cough   I have reviewed the patient's history in the EPIC record prior to the patient visit.     Her   pain has made it difficult to carry out daily activities and she   reports some benefit from using a THC gummy at night and is seeking more education about using cannabis for her daily chronic pain  . I reinforced the importance of exercise, mindfulness and dietary considerations for her  pain condition. Time spent in this visit was devoted to integrative coordination of care, counseling and answering patient's questions and concerns about the diagnoses listed above.    PLAN   Choudhari, Larkin Ina  has found her  to be a good candidate for cannabinoid therapy and she   was provided education and counseling on dosing for her  condition. Patient was educated concering any risk factors and/or potential drug/drug interactions. There were no barriers to learning.  She was provided information on anti-inflammatory natural products and we discussed normalizing her circadian function to calm inflammation and improve her daytime pain.  Return in 6 weeks       Pre-charting:  10 minutes  Face-to-face time:  40 minutes  Note completion:  10 minutes

## 2021-09-04 NOTE — Progress Notes (Signed)
This patient gave consent for this Medical Advice message and is aware that it may result in a bill to their insurance, as well as the possibility of receiving a bill for a co-pay and/or deductible. They are an established patient but are not seeking information exclusively about a problem treated during an in person or video visit in the last seven days. I do not recommend an in-person or video visit within seven days of my reply.     See the MyChart message from patient and my reply for my assessment and plan.  MyChart encounter date:  09/03/2021    I spent a total of  5 minutes reviewing the patient's prior medical records and current request for medical advice, prescribing medications, or ordering tests (if applicable), replying to the patient, and documenting the encounter.

## 2021-09-04 NOTE — Patient Instructions (Addendum)
Trial of melatonin for helping get back on a normal circadian schedule  Sublingual:  allow one tablet to dissolve under your tongue 30 min prior to desired bedtime.  If after 2 nights, you have no improvement, increase to 2 tablets.  30 min after the melatonin take the cannabis gummy:  5-10 mg of THC to help with staying asleep, just prior to laying your head on the pillow    Anti-inflammatory strategies:  Curapro 1250 mg twice daily  Fish Oil 2000 mg bid  NAC:  600 mg BID  Watch for email from Full script    Myriam's Hemp for daytime pain  Daily 50:    [email or web address].[email or web address]  Take 1 full dropper every 6 hours  If this does not help you, you can try 1/4 gummy for 2.5 mg of THC during the day    Continue with  5-10 mg of THC at night- take it just prior to lying down to sleep          vaporized Cannabis  Tools for vaporizing cannabis: not purchased at cannabis dispensaries, but can be found at 'smoke' shops (tobacco) or purchased online from the company.  Stor[employer]-Bick[employer]: ( www.storz-bickel.com)   How it works (https://www.youtube.com/watch?v=Slfyb3kHnhc' \\t'$  _blank) and   How to use  (https://www.youtube.com/watch?v=ykOFyynv0ww' \\t'$  _blank)  PAX 2: (https://www.pax.com/products/pax-2?variant=13409044299891' \\t'$  _blank)    How to use (https://www.youtube.com/watch?v=XwXdE76jxEE&t=36s' \\t'$  _blank)    I recommend low potency cannabis flower- shop for THC potency between 5-10%.   Here is one I found at [facility] dispensary in [facility]: https://embrdispensarylamesa.wm.store/menu/henry-s-original-cbd-coast-3-5g?filter%5BanyCategories%5D%5B%5D=flower    The Cannabist in [facility] has Coralyn Mark Tx Gelato:  WebCheating.fr    How to vaporize: use vaporized cannabis to jumpstart pain control, for breakthrough pain between opioid or oral cannabis doses, or for night-waking pain.  Using an herb grinder, grind the flower. If you are new to inhaling cannabis, start with one inhalation. Once  you have cannabis herb product ready and loaded in the heating chamber:    Titrate the dose by:  1) Temperature of the vaporizer: start with 170 C. (or the lowest setting) and increase as tolerated. The lower temperature will be less harsh on your throat; higher temperature will deliver a higher dose.   2) Potency of flower you place in the "oven" (ratio of CBD to [facility] flower). Aim for approximately 5-15% THC content  3) Inhalation quality: how deeply, how long and how many times you inhale.     To inhale, when the vaporizer indicates it is heated up, lightly puff on the mouthpiece to insure that you see vapor, then sip on the mouthpiece to inhale to a count of 4. No need to hold your breath, simply relax and slowly exhale. When you see a stream of vapor, stop your exhale and inhale again, this time with room air, as deeply as you can. Repeat 2-3 times until you no longer see vapor.     Allow 15 minutes to evaluate the effect. If you are feeling no effect, inhale again for a count of 4 and exhale as before. Wait 15 min. You may eventually take 2-3 inhalations per "dose".   Research has shown 5-7 % THC content of inhaled cannabis to be effective for pain benefit. Higher dose does not mean that you will have better pain benefit.  Plant material can be reheated 2-3 times before losing vapor.    Precautions:   Caution is advised around any balance issues, and if you have pre-existing dizziness, cannabis may worsen  this.    If you are a "fall risk", please always use your walking aid.  Caution is advised with driving after using Cannabis:  wait 4-6 hours after ingestion and 2 hours after inhalation.     For  a follow-up appointment in 6 weeks please call:  [facility]  [phone] or [phone]     If you have any questions or are unable to schedule an appointment you can always contact me through mychart.   Thank you for the opportunity to be a part of your healthcare  team!

## 2021-09-05 ENCOUNTER — Telehealth (INDEPENDENT_AMBULATORY_CARE_PROVIDER_SITE_OTHER): Payer: Self-pay | Admitting: Student in an Organized Health Care Education/Training Program

## 2021-09-05 ENCOUNTER — Encounter (INDEPENDENT_AMBULATORY_CARE_PROVIDER_SITE_OTHER): Payer: Self-pay

## 2021-09-05 NOTE — Telephone Encounter (Signed)
Received documents from [facility]. Forms placed in Dr.Choudhari, Justin in box. Once MD reviews and signs, records will be faxed to number provided on forms. Once successfully faxed, records will be scanned into Epic and will be available under Media Tab for further viewing.

## 2021-09-05 NOTE — Telephone Encounter (Signed)
S/w pharmacy states that [facility] pt needs and pt needs to call other pharmacys to check their availability.

## 2021-09-05 NOTE — Telephone Encounter (Signed)
Called pt to review. Will not prescribe oxycodone given immediate release formulation and patient on longer release. Left VM, pt to choose another pharmacy that we can send her prescription to. If patient returns call, please get pharmacy information that patient would like [place] to send her current prescription to.

## 2021-09-05 NOTE — Telephone Encounter (Signed)
From: Tunisha Cocco  To: [name], MD  Sent: 09/04/2021 6:10 PM PDT  Subject: They oxycodone-acetaminophen out at pharmacy     I'm so sorry to keep bothering you but the pharmacy is out and all they have is the plain oxycodone without the acetaminophen they said they faxed you the information so can you please send them the new prescription thank you so much

## 2021-09-12 ENCOUNTER — Other Ambulatory Visit (INDEPENDENT_AMBULATORY_CARE_PROVIDER_SITE_OTHER): Payer: Self-pay | Admitting: Student in an Organized Health Care Education/Training Program

## 2021-09-12 DIAGNOSIS — E119 Type 2 diabetes mellitus without complications: Secondary | ICD-10-CM

## 2021-09-12 DIAGNOSIS — R110 Nausea: Secondary | ICD-10-CM

## 2021-09-12 DIAGNOSIS — G894 Chronic pain syndrome: Secondary | ICD-10-CM

## 2021-09-12 DIAGNOSIS — M797 Fibromyalgia: Secondary | ICD-10-CM

## 2021-09-12 MED ORDER — OXYCODONE-ACETAMINOPHEN 10-325 MG OR TABS
0.5000 | ORAL_TABLET | Freq: Four times a day (QID) | ORAL | 0 refills | Status: DC | PRN
Start: 2021-09-12 — End: 2021-09-18

## 2021-09-12 MED ORDER — CONTROLLED SUBSTANCE AGREEMENT
0 refills | Status: AC
Start: 2021-09-12 — End: 2022-09-12

## 2021-09-12 NOTE — Telephone Encounter (Addendum)
General Inquiry     Who is calling: Incoming call from patient    Reason for this call: [facility] [address] Rx is not in stock [facility] usual [facility]. Pt is out of medication. Pended order.    Action required by office: Please process request     Duplicate encounter? No previous documentation found on this issue.     Best way to contact: [phone]  Alternative: [phone]    Inquiry has been read verbatim to this caller. Verbalizes satisfaction and confirms the above is accurate: yes    Has been advised this message will be transmitted to office and can expect a response within the next 24-72 hours.    Encounter created by Care Assist MA.  If further action required please route encounter to appropriate in clinic MA/LVN/Resident Pool

## 2021-09-12 NOTE — Telephone Encounter (Signed)
Rx resent.

## 2021-09-13 MED ORDER — ONDANSETRON 8 MG OR TBDP
ORAL_TABLET | ORAL | 0 refills | Status: DC
Start: 2021-09-13 — End: 2021-11-13

## 2021-09-13 MED ORDER — LANTUS SOLOSTAR 100 UNIT/ML SC SOLN
PEN_INJECTOR | SUBCUTANEOUS | 0 refills | Status: DC
Start: 2021-09-13 — End: 2021-10-02

## 2021-09-13 NOTE — Telephone Encounter (Signed)
[facility]     lantus and ondansetron is not currently included in the Pharmacy Refill Clinic protocols. Re-routing to the responsible staff for processing.  Thank you

## 2021-09-17 ENCOUNTER — Encounter (INDEPENDENT_AMBULATORY_CARE_PROVIDER_SITE_OTHER): Payer: Self-pay | Admitting: Student in an Organized Health Care Education/Training Program

## 2021-09-17 ENCOUNTER — Telehealth (INDEPENDENT_AMBULATORY_CARE_PROVIDER_SITE_OTHER): Payer: Self-pay | Admitting: Student in an Organized Health Care Education/Training Program

## 2021-09-17 DIAGNOSIS — G894 Chronic pain syndrome: Secondary | ICD-10-CM

## 2021-09-17 DIAGNOSIS — M797 Fibromyalgia: Secondary | ICD-10-CM

## 2021-09-17 NOTE — Telephone Encounter (Signed)
Who is calling: Incoming call from patient  Insurance Coverage Verified: Active- in network    Reason for this call: Pt requesting rx oxycodone to be re sent to [facility]. [facility] .     Action required by office: Please expedite request and Please contact caller    Best way to contact: [phone]      Duplicate encounter? No previous documentation found on this issue.  Inquiry has been read verbatim to this caller. Verbalizes satisfaction and confirms the above is accurate: yes    Caller has been advised this message will be transmitted to office and can expect a response within the next 24-72 hours.

## 2021-09-18 MED ORDER — OXYCODONE-ACETAMINOPHEN 10-325 MG OR TABS
0.5000 | ORAL_TABLET | Freq: Four times a day (QID) | ORAL | 0 refills | Status: DC | PRN
Start: 2021-09-18 — End: 2021-10-31

## 2021-09-18 NOTE — Telephone Encounter (Signed)
This is a chronic prescription, pls see previous documentation

## 2021-09-18 NOTE — Telephone Encounter (Signed)
S/w with Massie Maroon gave DX along with current and tried and failed medications.

## 2021-09-18 NOTE — Telephone Encounter (Signed)
General Inquiry     Who is calling: Incoming call from Massie Maroon [facility])    Reason for this call:     [facility]) is calling in regards to patient medication for Percocet.     She is requesting a call back from the office. [facility] is requesting medical advice from PCP. Is this medication treatment for chronic prescription or acute? Please advise, thank you!    Just an FYI: Patient requested for medication to be sent to [facility] [address] encounter 09/17/2021.     Action required by office: Please contact caller     Duplicate encounter? No previous documentation found on this issue.     Best way to contact:[phone]    Inquiry has been read verbatim to this caller. Verbalizes satisfaction and confirms the above is accurate: yes    Has been advised this message will be transmitted to office and can expect a response within the next 24-72 hours.    Encounter created by Care Assist MA.  If further action required please route encounter to appropriate in clinic MA/LVN/Resident Pool

## 2021-09-19 ENCOUNTER — Other Ambulatory Visit (INDEPENDENT_AMBULATORY_CARE_PROVIDER_SITE_OTHER): Payer: Self-pay | Admitting: Student in an Organized Health Care Education/Training Program

## 2021-09-19 DIAGNOSIS — E119 Type 2 diabetes mellitus without complications: Secondary | ICD-10-CM

## 2021-09-19 NOTE — Telephone Encounter (Signed)
[facility]     HUMALOG KWIKPEN  is not currently included in the Pharmacy Refill Clinic protocols. Re-routing to the responsible staff for processing.  Thank you.

## 2021-09-20 ENCOUNTER — Other Ambulatory Visit (INDEPENDENT_AMBULATORY_CARE_PROVIDER_SITE_OTHER): Payer: Self-pay | Admitting: Student in an Organized Health Care Education/Training Program

## 2021-09-20 DIAGNOSIS — L0292 Furuncle, unspecified: Secondary | ICD-10-CM

## 2021-09-20 NOTE — Telephone Encounter (Signed)
[facility]     mupirocin  is not currently included in the Pharmacy Refill Clinic protocols. Re-routing to the responsible staff for processing.  Thank you.

## 2021-09-24 ENCOUNTER — Encounter (INDEPENDENT_AMBULATORY_CARE_PROVIDER_SITE_OTHER): Payer: Self-pay | Admitting: Family Practice

## 2021-09-24 DIAGNOSIS — E119 Type 2 diabetes mellitus without complications: Secondary | ICD-10-CM

## 2021-09-25 NOTE — Telephone Encounter (Signed)
From: Pamelia Bhola  To: [name], MD  Sent: 09/24/2021 9:42 PM PDT  Subject: Refills    Can you please send in a refill for my lantus please at the [facility] please I got my last prescription filled In [place]

## 2021-09-27 ENCOUNTER — Encounter (INDEPENDENT_AMBULATORY_CARE_PROVIDER_SITE_OTHER): Payer: Self-pay | Admitting: Hospital

## 2021-09-30 ENCOUNTER — Telehealth (INDEPENDENT_AMBULATORY_CARE_PROVIDER_SITE_OTHER): Payer: Self-pay | Admitting: Internal Medicine

## 2021-09-30 NOTE — Telephone Encounter (Signed)
Placed call to pt. LVM advising pt template for Dr. Diaz-Gonzalez at [facility] is available for scheduling. Call back number provided to call back to schedule.

## 2021-10-02 ENCOUNTER — Encounter (INDEPENDENT_AMBULATORY_CARE_PROVIDER_SITE_OTHER): Payer: Self-pay | Admitting: Student in an Organized Health Care Education/Training Program

## 2021-10-02 ENCOUNTER — Telehealth (INDEPENDENT_AMBULATORY_CARE_PROVIDER_SITE_OTHER): Payer: Self-pay | Admitting: Student in an Organized Health Care Education/Training Program

## 2021-10-02 DIAGNOSIS — I10 Essential (primary) hypertension: Secondary | ICD-10-CM

## 2021-10-02 DIAGNOSIS — Z0000 Encounter for general adult medical examination without abnormal findings: Secondary | ICD-10-CM

## 2021-10-02 DIAGNOSIS — E782 Mixed hyperlipidemia: Secondary | ICD-10-CM

## 2021-10-02 DIAGNOSIS — E119 Type 2 diabetes mellitus without complications: Secondary | ICD-10-CM

## 2021-10-02 DIAGNOSIS — Z794 Long term (current) use of insulin: Secondary | ICD-10-CM

## 2021-10-02 MED ORDER — INSULIN GLARGINE SOLOSTAR 100 UNIT/ML SC SOPN
48.0000 [IU] | PEN_INJECTOR | Freq: Every evening | SUBCUTANEOUS | 0 refills | Status: DC
Start: 2021-10-02 — End: 2021-10-31

## 2021-10-02 NOTE — Telephone Encounter (Signed)
*  The requested med passed all protocol parameters in the associated med info guide - Protocol details reviewed by pharmacist.       Angiotensin II Receptor Blocker (ARB) Refill Protocol    Last visit in enc specialty: 09/04/2021     Recent Visits in This Encounter Department     Date Provider Department Visit Type Primary Dx    09/04/2021 [name], MD [facility] Non-Face-to-Face Chronic pain syndrome    08/28/2021 [name], MD [facility] Office Visit Chronic cough    04/29/2021 [name], MD [facility] Non-Face-to-Face Chronic pain syndrome    04/16/2021 [name], MD [facility] Office Visit Type 2 diabetes mellitus with diabetic autonomic neuropathy, with long-term current use of insulin (CMS-HCC)    01/14/2021 [name], MD  [facility] Office Visit Chronic pain syndrome         Population Health Visits  Recent [facility] Visits    None       Next appt in enc specialty: Visit date not found      Future Appointments 10/02/2021 - 10/01/2026      Date Visit Type Department Provider     10/04/2021  9:00 AM NEW [facility] PATIENT [place] Derwood Kaplan, MD    Appointment Notes:     *New pt EU:[id number] ,Other chronic painAware of 30 min check , NPQ sent             10/21/2021 11:30 AM RETURN [facility] Nicole Kindred Lap-Kay, MD    Appointment Notes:     LOA needed*3 mon x SarcoidosisPt to cmpt: Chest XR                      LABS required:  (Q year Potassium, Creatinine)    Lab Results   Component Value Date    NA 142 11/21/2020    K 4.2 11/21/2020    CL 103 11/21/2020    BICARB 30 (H) 11/21/2020    BUN 15 11/21/2020    CREAT 0.97 (H) 11/21/2020       Lab Results   Component Value Date    GFRNON >60 11/21/2020    EGFRCKDEPI >60 11/21/2020         Monitoring required:  (Q year BP) (pregnancy prn)  *If pt is pregnant, REFER TO MD*    (BP range: ATVVLRTJ=40-992   TSSQSYPZX=80-63)  Blood Pressure   08/28/21 116/71   08/05/21 142/81   08/02/21 140/62       Pulse Readings from Last 3 Encounters:   08/28/21 82   08/05/21 68   08/02/21 67         Last Digital Health Monitoring Vitals:        Last MyChart BP Values:        Last Pt Entered MyChart BP Values:

## 2021-10-02 NOTE — Telephone Encounter (Signed)
From: Verina Nienhaus  To: [name], MD  Sent: 10/02/2021 3:05 PM PDT  Subject: Lantus    Can you please send in a prescription for my lantus please I running out of my Lantus insulin and the pharmacy called and said I'm out of refills so if you cans please send in a refill I would appreciate it thank you so much

## 2021-10-03 ENCOUNTER — Telehealth (INDEPENDENT_AMBULATORY_CARE_PROVIDER_SITE_OTHER): Payer: Self-pay | Admitting: Internal Medicine

## 2021-10-03 NOTE — Telephone Encounter (Signed)
Dr. Cameron Ali out morning of 7/14. LVM for patient to call and reschedule this appointment. Patient may reschedule with any provider.

## 2021-10-04 ENCOUNTER — Ambulatory Visit (INDEPENDENT_AMBULATORY_CARE_PROVIDER_SITE_OTHER): Payer: No Typology Code available for payment source | Admitting: Internal Medicine

## 2021-10-04 MED ORDER — LOSARTAN POTASSIUM 50 MG OR TABS
50.00 mg | ORAL_TABLET | Freq: Every day | ORAL | 0 refills | Status: DC
Start: 2021-10-04 — End: 2021-11-20

## 2021-10-04 NOTE — Telephone Encounter (Signed)
Scheduling Request:   [facility]     Please remind pt to schedule f/u appt w Dr Lorelee Cover, to establish care and labs (video visit if appropriate), was due on or after 09/27/2021.     *Please inform pt to wait to complete any ordered labs until AFTER their scheduled visit, unless otherwise instructed by their provider*    Authorized 90 days supply + 0 RF until f/u scheduled.     **Please note: if patient has completed labs at [facility] in the last 6 months - Can a copy of her most recent be uploaded to media?    Thanks,    [facility]   Refill and Prior [employer]  Phone:  [phone]  Ext:  [phone]

## 2021-10-07 ENCOUNTER — Telehealth (HOSPITAL_COMMUNITY): Payer: Self-pay

## 2021-10-07 NOTE — Telephone Encounter (Signed)
Error

## 2021-10-08 ENCOUNTER — Encounter (INDEPENDENT_AMBULATORY_CARE_PROVIDER_SITE_OTHER): Payer: Self-pay | Admitting: Internal Medicine

## 2021-10-09 ENCOUNTER — Telehealth: Payer: Self-pay | Admitting: Pharmacist

## 2021-10-09 DIAGNOSIS — Z79899 Other long term (current) drug therapy: Secondary | ICD-10-CM

## 2021-10-09 NOTE — Telephone Encounter (Signed)
Comprehensive Medication Review     Contacted patient via MyChart to schedule a CMR with Eric Dehner, PharmD.     Sent MyChart message to patient. Provided contact number for [facility] MTM program & requested patient to call back if they wish to schedule appointment.     Attempt: 1    Judy Bracamontes, CPhT  Pharmacy Technician III (MTM)  [facility]  Email: [email or web address]  Phone: [phone]

## 2021-10-15 NOTE — Telephone Encounter (Signed)
Closing this encounter

## 2021-10-20 ENCOUNTER — Encounter (INDEPENDENT_AMBULATORY_CARE_PROVIDER_SITE_OTHER): Payer: Self-pay | Admitting: Student in an Organized Health Care Education/Training Program

## 2021-10-20 DIAGNOSIS — M797 Fibromyalgia: Secondary | ICD-10-CM

## 2021-10-20 DIAGNOSIS — G894 Chronic pain syndrome: Secondary | ICD-10-CM

## 2021-10-21 ENCOUNTER — Ambulatory Visit (INDEPENDENT_AMBULATORY_CARE_PROVIDER_SITE_OTHER): Payer: No Typology Code available for payment source | Admitting: Pulmonary Medicine

## 2021-10-21 ENCOUNTER — Other Ambulatory Visit: Payer: Self-pay

## 2021-10-21 ENCOUNTER — Encounter (INDEPENDENT_AMBULATORY_CARE_PROVIDER_SITE_OTHER): Payer: Self-pay | Admitting: Hospital

## 2021-10-21 ENCOUNTER — Encounter (INDEPENDENT_AMBULATORY_CARE_PROVIDER_SITE_OTHER): Payer: Self-pay | Admitting: Pulmonary Medicine

## 2021-10-21 ENCOUNTER — Encounter (INDEPENDENT_AMBULATORY_CARE_PROVIDER_SITE_OTHER): Payer: Self-pay

## 2021-10-21 ENCOUNTER — Telehealth (INDEPENDENT_AMBULATORY_CARE_PROVIDER_SITE_OTHER): Payer: Self-pay | Admitting: Pulmonary Medicine

## 2021-10-21 VITALS — BP 145/79 | HR 81 | Temp 97.8°F | Resp 20 | Ht 65.0 in | Wt 218.5 lb

## 2021-10-21 DIAGNOSIS — G4733 Obstructive sleep apnea (adult) (pediatric): Secondary | ICD-10-CM

## 2021-10-21 DIAGNOSIS — R059 Cough, unspecified: Secondary | ICD-10-CM

## 2021-10-21 DIAGNOSIS — D869 Sarcoidosis, unspecified: Secondary | ICD-10-CM

## 2021-10-21 MED ORDER — GUAIFENESIN-CODEINE 100-10 MG/5ML OR SOLN
5.0000 mL | Freq: Four times a day (QID) | ORAL | 0 refills | Status: DC | PRN
Start: 2021-10-21 — End: 2022-02-06

## 2021-10-21 MED ORDER — DOXYCYCLINE MONOHYDRATE 100 MG OR CAPS
100.0000 mg | ORAL_CAPSULE | Freq: Two times a day (BID) | ORAL | 1 refills | Status: DC
Start: 2021-10-21 — End: 2022-03-03

## 2021-10-21 MED ORDER — GUAIFENESIN-CODEINE 100-10 MG/5ML OR SOLN
5.0000 mL | Freq: Four times a day (QID) | ORAL | 0 refills | Status: DC | PRN
Start: 2021-10-21 — End: 2021-10-21

## 2021-10-21 NOTE — Telephone Encounter (Signed)
Pain Management:      Please call Patient needs a Urine drug screen and a pain management contract signed before Rx can be refilled.   As per protocol.

## 2021-10-21 NOTE — Progress Notes (Addendum)
Pulmonary Clinic Follow Up Clinic Visit    DATE OF SERVICE: October 21, 2021    SERVICE: Pulmonary    ATTENDING PHYSICIAN: Nicole Kindred, MD    REFERRING PROVIDER: Nicole Kindred Lap-Kay    PRIMARY CARE PHYSICIAN: Lorelee Cover    REASON FOR VISIT: Sarcoidosis    History of Present Illness:Kimberly Curtis is a 66 year old female with history of sarcoidosis (on chronic steroids, previous bronchial stent removed 04/30/2020 in [place]) with chronic cough.    Other PMHx significant for HTN, elevated BMI, and T2DM/peripheral neuropathy    Patient was first diagnosed to have sarcoidosis ~1990's when she presented with GI upset and abnormal CT scan with ?interstital fibrosis. She underwent a needle biopsy "through my back" which showed diagnosis of sarcoidosis.     For the past 10 years, patient has being having frequent chest infections from 1-2 times a year to eventually 3-4 times a year. However, in 2020, patient was placed on methotrexate in order tot ry to help patient but "it wasn't doing any good" so it was stopped in Jan 2022.  Co-incidentally, in the past 2 years, she has ~6 times of chest infection/year. In 2022 so far, she has already been hospitalized 4 x, each time she responded to antibiotics.     In January 2022, her doctor in [place] placed ?bronchial/tracheal stent in patient for ?Chauncey Fischer but had to be removed after 1 month due to local irritation.     Patient also has sinus symptoms with endoscopic evidence of "yeast" and was given Diflucan for 7 days.    Twi months ago, she moved to [facility] and started her care at [facility].    Patient has been on prednisone since 2020, with baseline dose of 10 mg. Was on methotrexate for 2 years until Janaury 2022.    Patient was admitted to [facility] for pneumonia 5/17-27/2022 while visiting daughter in [place], including short stay in ICU  with [place], and completed antibiotics (Ceftin 5/31):  repeat sputum Aug 02, 2021: MSSA.    EVENTS  Cough improved with Levaquin  but recurred ~ 1-  1/2 month later. Cough mostly productive, and throughout day.    Also had trouble fitting machine of CPAP    Past Medical History:  Patient Active Problem List   Diagnosis   . Type 2 diabetes mellitus with diabetic autonomic neuropathy, with long-term current use of insulin (CMS-HCC)   . Sarcoidosis   . Chronic pain syndrome   . Recurrent falls   . Chronic pain of both knees   . Gastroesophageal reflux disease, unspecified whether esophagitis present   . Nausea   . Essential hypertension   . Itching   . Mixed hyperlipidemia   . Anxiety   . Allergy, initial encounter   . S/P placement of cardiac pacemaker   . Vitamin D deficiency   . Fibromyalgia   . Chronic pain of both shoulders   . Bilateral hip pain   . Chronic cough       Past Surgical History:  Past Surgical History:   Procedure Laterality Date   . AICD pacemaker N/A 09/2018   . CHOLECYSTECTOMY     . HYSTERECTOMY     . KNEE ARTHROSCOPY Left      Past Surgical History:   Procedure Laterality Date   . AICD pacemaker N/A 09/2018   . CHOLECYSTECTOMY     . HYSTERECTOMY     . KNEE ARTHROSCOPY Left  Medications: (Home)  Outpatient Medications Marked as Taking for the 10/21/21 encounter (Office Visit) with Eda Keys, MD   Medication Sig Dispense Refill   . albuterol (PROVENTIL) (2.5 MG/3ML) 0.083% nebulization 3 mL (2.5 mg) by Nebulization route every 4 hours as needed for Wheezing. 1 each 2   . albuterol 108 (90 Base) MCG/ACT inhaler Inhale 2 puffs by mouth every 6 hours as needed for Wheezing. 1 each 0   . Albuterol Sulfate 108 (90 Base) MCG/ACT AEPB Inhale 1 puff by mouth as needed.     . ASPIRIN LOW DOSE 81 MG EC tablet TAKE 1 TABLET BY MOUTH EVERY DAY 120 tablet 1   . atorvastatin (LIPITOR) 40 MG tablet Take 1 tablet (40 mg) by mouth daily. 90 tablet 1   . Continuous Blood Gluc Receiver (CONTINUOUS BLOOD GLUCOSE, DEXCOM G6, RECEIVER) Use daily to read glucose topically 1 each 0   . Continuous Blood Gluc Sensor (CONTINUOUS BLOOD  GLUCOSE, DEXCOM G6, SENSOR) Insert subcutaneously every 10 days. 10 each 4   . Continuous Blood Gluc Transmit (CONTINUOUS BLOOD GLUCOSE, DEXCOM G6, TRANSMITTER) Use daily to transmit subcutaneous glucose value. Change every 3 months. 1 each 4   . controlled substance agreement controlled substance agreement 1 each 0   . famotidine (PEPCID) 40 MG tablet Take 1 tablet (40 mg) by mouth daily. 90 tablet 1   . fexofenadine (ALLEGRA) 180 MG tablet Take 1 tablet (180 mg) by mouth daily. 90 tablet 1   . fluticasone-umeclidinium-vilanterol (TRELEGY ELLIPTA) 100-62.5-25 MCG/INH AEPB Inhale 1 puff by mouth daily.     . folic acid (FOLVITE) 1 MG tablet TAKE 1 TABLET BY MOUTH EVERY DAY 90 tablet 1   . hydrOXYzine HCL (ATARAX) 50 MG tablet Take 1 tablet (50 mg) by mouth 2 times daily as needed for Itching. 180 tablet 3   . Insulin Glargine Solostar (LANTUS SOLOSTAR) 100 UNIT/ML SOPN Inject 48 Units under the skin nightly. 15 mL 0   . insulin lispro, 1 Unit Dial, (HUMALOG KWIKPEN) 100 units/mL INJECT 20 UNITS UNDER THE SKIN 3 TIMES DAILY AS NEEDED FOR HIGH BLOOD SUGAR. IN THE MORNING 20 UNITS + SLIDING SCALE MID DAY: 17 + SLIDING SCALE AND IN THE EVENING 12 + SLIDING SCALE 15 mL 11   . losartan (COZAAR) 50 MG tablet Take 1 tablet (50 mg) by mouth daily. 90 tablet 0   . metoprolol tartrate (LOPRESSOR) 50 MG tablet Take 1 tablet (50 mg) by mouth 2 times daily. 180 tablet 3   . mirtazapine (REMERON) 30 MG tablet TAKE 1 TABLET BY MOUTH NIGHTLY 90 tablet 1   . montelukast (SINGULAIR) 10 MG tablet Take 1 tablet (10 mg) by mouth every evening. 90 tablet 3   . mupirocin (BACTROBAN) 2 % ointment APPLY 1 APPLICATION TOPICALLY 2 TIMES DAILY. USE A SMALL AMOUNT AS DIRECTED 22 g 1   . naloxone (NARCAN) 4 mg/0.1 mL nasal spray Spray 1 spray into one nostril once as needed.     Marland Kitchen NOVOLOG FLEXPEN 100 UNIT/ML injection pen INJECT 20 UNITS UNDER THE SKIN 3 TIMES DAILY AS NEEDED FOR HIGH BLOOD SUGAR. IN THE MORNING 20 UNITS + SLIDING SCALE MID DAY:  17 + SLIDING SCALE AND IN THE EVENING 12 + SLIDING SCALE 15 each 11   . ondansetron (ZOFRAN ODT) 8 MG disintegrating tablet TAKE 1 TABLET ON OR UNDER THE TONGUE EVERY 8 HOURS AS NEEDED FOR NAUSEA/VOMITING. 30 tablet 0   . oxycodone-acetaminophen (PERCOCET) 10-325 MG tablet Take 0.5  tablets by mouth every 6 hours as needed for Severe Pain (Pain Score 7-10). 30 tablet 0   . pantoprazole (PROTONIX) 40 MG tablet Take 1 tablet (40 mg) by mouth every morning (before breakfast). 90 tablet 3   . pregabalin (LYRICA) 75 MG capsule TAKE 1 CAPSULE BY MOUTH EVERY MORNING AND 2 CAPSULES EVERY EVENING 90 capsule 2   . promethazine-dextromethorphan (PROMETHAZINE-DM) 6.25-15 MG/5ML syrup Take 5 mL by mouth every 6 hours as needed for Cough. 118 mL 0   . sertraline (ZOLOFT) 100 MG tablet Take 1 tablet in the morning 90 tablet 1   . TECHLITE PEN NEEDLES 32G X 4 MM MISC USE ONE PEN NEEDLE WITH EACH INSULIN ADMINISTRATION. 200 each 11       Allergies:  Allergies   Allergen Reactions   . Sulfa Drugs Anaphylaxis   . Benadryl [Diphenhydramine] Rash and Itching     Per 12/25/20 TE: Do you have any additional known allergies to over the counter or prescription medications? Yes   If yes, please list additional medication(s) dye codeine on benadryl  and explain reaction(s)itching, rash    . Latex Itching     Per 12/25/20 TE: Are you allergic to latex? Yes   If yes, please explain reaction(s)itchiness    . Macrolides Other     History of QT-C prolongation   . Morphine Itching       Social History:  Social History     Socioeconomic History   . Marital status: Married     Spouse name: Not on file   . Number of children: Not on file   . Years of education: Not on file   . Highest education level: Not on file   Occupational History   . Not on file   Tobacco Use   . Smoking status: Former   . Smokeless tobacco: Never   Substance and Sexual Activity   . Alcohol use: Not Currently   . Drug use: Never   . Sexual activity: Not on file   Other Topics  Concern   . Not on file   Social History Narrative   . Not on file     Social Determinants of Health     Financial Resource Strain: Not on file   Food Insecurity: Not on file   Transportation Needs: Not on file   Physical Activity: Not on file   Stress: Not on file   Social Connections: Not on file   Intimate Partner Violence: Not on file   Housing Stability: Not on file     Social History     Socioeconomic History   . Marital status: Married     Spouse name: Not on file   . Number of children: Not on file   . Years of education: Not on file   . Highest education level: Not on file   Occupational History   . Not on file   Tobacco Use   . Smoking status: Former   . Smokeless tobacco: Never   Substance and Sexual Activity   . Alcohol use: Not Currently   . Drug use: Never   . Sexual activity: Not on file   Other Topics Concern   . Not on file   Social History Narrative   . Not on file     Social Determinants of Health     Financial Resource Strain: Not on file   Food Insecurity: Not on file   Transportation Needs: Not on file  Physical Activity: Not on file   Stress: Not on file   Social Connections: Not on file   Intimate Partner Violence: Not on file   Housing Stability: Not on file       Family History:  Family History   Problem Relation Age of Onset   . Diabetes Mother    . Heart Disease Mother    . Kidney Disease Mother    . Hypertension Father    . Alcohol/Drug Father          Review of Systems:12 point review of systems was negative except per HPI    Physical Examination:: BP 145/79 (BP Location: Left arm, BP Patient Position: Sitting, BP cuff size: Large)   Pulse 81   Temp 97.8 F (36.6 C) (Temporal)   Resp 20   Ht '5\' 5"'$  (1.651 m)   Wt 99.1 kg (218 lb 8 oz)   SpO2 100%   BMI 36.36 kg/m    General: Well developed, well nourished obese female, in no acute distress, AAOx3  HEENT: Normocephalic, atraumatic, PERRLA, oropharynx moist  Neck: Soft, supple  Lungs: few crackles right mid-lower field  posteriorly. Minimal rhonchi  CVS: Regular rate and rhythm, no murmurs, rubs, or gallops  Abdomen: Soft, nontender, nondistended, no masses palpated, No CVA tenderness bilaterally  Extremity: Warm, well perfused. No cyanosis, clubbing, 2+ edema: No focal deficits    Labs & Studies:  Common labs:   Albumin   Date Value Ref Range Status   11/21/2020 4.1 3.5 - 5.2 g/dL Final     ALT (SGPT)   Date Value Ref Range Status   11/21/2020 26 0 - 33 U/L Final     AST (SGOT)   Date Value Ref Range Status   11/21/2020 15 0 - 32 U/L Final     BUN   Date Value Ref Range Status   11/21/2020 15 8 - 23 mg/dL Final     Calcium   Date Value Ref Range Status   11/21/2020 9.4 8.5 - 10.6 mg/dL Final     Chloride   Date Value Ref Range Status   11/21/2020 103 98 - 107 mmol/L Final     Cholesterol   Date Value Ref Range Status   10/30/2020 217 <200 mg/dL Final     Comment:     Borderline Risk 200-240 mg/dL   High Risk > 240 mg/dL       Creatinine   Date Value Ref Range Status   11/21/2020 0.97 (H) 0.51 - 0.95 mg/dL Final     GFR   Date Value Ref Range Status   11/21/2020 >60 mL/min Final     Comment:     This is an estimated glomerular filtration rate   (mL/min/1.73 m2) based on the MDRD equation.   CKD Stage 3: GFR 30-59   CKD Stage 4: GFR 15-29   CKD Stage 5: GFR  < 15 or dialysis dependent       Glucose   Date Value Ref Range Status   11/21/2020 237 (H) 70 - 99 mg/dL Final     HDL-Cholesterol   Date Value Ref Range Status   10/30/2020 72 mg/dL Final     Comment:     An HDL Cholesterol <40 mg/dL is a risk factor for  coronary heart disease.       Hgb   Date Value Ref Range Status   11/21/2020 10.2 (L) 11.2 - 15.7 gm/dL Final     Glyco Hgb (A1C)  Date Value Ref Range Status   04/02/2021 7.2 (H) 4.8 - 5.8 % Final     Comment:     Hemoglobin A1c values of 5.7-6.4 percent indicate an increased risk for   developing diabetes mellitus. Hemoglobin A1c values greater than or equal   to 6.5 percent are diagnostic of diabetes mellitus. Diagnosis  should be   confirmed by repeating the Hb A1c test. Hb F higher than 10 percent of   total Hb may yield falsely low results. Conditions that shorten red cell   survival, such as the presence of unstable hemoglobins like Hb SS, Hb CC,   and Hb SC, or other causes of hemolytic anemia may yield falsely low   results. Iron deficiency anemia may yield falsely high results.        Magnesium   Date Value Ref Range Status   05/12/2020 2.1 1.6 - 2.4 mg/dL Final     Plt Count   Date Value Ref Range Status   11/21/2020 251 140 - 370 1000/mm3 Final     Potassium   Date Value Ref Range Status   11/21/2020 4.2 3.5 - 5.1 mmol/L Final     Sodium   Date Value Ref Range Status   11/21/2020 142 136 - 145 mmol/L Final     Triglycerides   Date Value Ref Range Status   10/30/2020 129 10 - 170 mg/dL Final     WBC   Date Value Ref Range Status   11/21/2020 9.3 4.0 - 10.0 1000/mm3 Final       Urinalysis:   Color   Date Value Ref Range Status   04/16/2021 Yellow Yellow Final   05/13/2020 Yellow Yellow Final     Appearance   Date Value Ref Range Status   04/16/2021 Clear Clear Final   05/13/2020 Turbid Clear Final     Glucose   Date Value Ref Range Status   04/16/2021 Negative Negative Final   05/13/2020 4+ (A) Negative Final     Bilirubin   Date Value Ref Range Status   04/16/2021 Negative Negative Final   05/13/2020 Negative Negative Final     Ketones   Date Value Ref Range Status   04/16/2021 Negative Negative Final   05/13/2020 3+ (A) Negative Final     Specific Gravity   Date Value Ref Range Status   04/16/2021 >=1.030 1.002 - 1.030 Final   05/13/2020 1.030 1.002 - 1.030 Final     Blood   Date Value Ref Range Status   04/16/2021 Negative Negative Final   05/13/2020 Negative Negative Final     pH   Date Value Ref Range Status   04/16/2021 5.5 5.0 - 8.0 Final   05/13/2020 6.0 5.0 - 8.0 Final     Protein   Date Value Ref Range Status   04/16/2021 Trace (A) Negative Final   05/13/2020 2+ (A) Negative Final     Urobilinogen   Date Value Ref  Range Status   04/16/2021 0.2 0.2 - 1.0 Final   05/13/2020 1+ (A) Negative Final     Nitrite   Date Value Ref Range Status   04/16/2021 Negative Negative Final   05/13/2020 Negative Negative Final     Leuk Esterase   Date Value Ref Range Status   04/16/2021 Negative Negative Final   05/13/2020 75 (A) Negative Leu/uL Final     Comment:     Urine culture added by reflex based on this result.  Test interpretation:  25 Leu/uL =  Trace  75 Leu/uL = 1+  250 Leu/uL = 2+  500 Leu/uL = 3+       WBC   Date Value Ref Range Status   05/13/2020 6-10 (A) 0-2/HPF Final     Comment:     The absence of >5-10 WBC, visible bacteria, leukocyte esterase, and nitrite   in urine has about 92- 98% negative predictive value for detecting urinary   tract infections. Performing urine cultures in these circumstances are   unlikely to provide clinical benefit.       RBC   Date Value Ref Range Status   05/13/2020 3-5 (A) 0-2/HPF Final       Microbiology:  No results found for: BLOODCULT, FUNGALBC, AFBBACTCULT, URINECULTURE, QTFERON, QUANTIFERON, CRYPTOAG, CRYPTOCAGCSF, COCCICF, COCCICFCSF, COCCIIMMDIIF, COCCIIMMCSF, HISTOAGUR, CMVDNAQT, CMVDNAQTCSF    Sodium   Date Value Ref Range Status   11/21/2020 142 136 - 145 mmol/L Final     Potassium   Date Value Ref Range Status   11/21/2020 4.2 3.5 - 5.1 mmol/L Final       Glyco Hgb (A1C)   Date Value Ref Range Status   04/02/2021 7.2 (H) 4.8 - 5.8 % Final     Comment:     Hemoglobin A1c values of 5.7-6.4 percent indicate an increased risk for   developing diabetes mellitus. Hemoglobin A1c values greater than or equal   to 6.5 percent are diagnostic of diabetes mellitus. Diagnosis should be   confirmed by repeating the Hb A1c test. Hb F higher than 10 percent of   total Hb may yield falsely low results. Conditions that shorten red cell   survival, such as the presence of unstable hemoglobins like Hb SS, Hb CC,   and Hb SC, or other causes of hemolytic anemia may yield falsely low   results. Iron  deficiency anemia may yield falsely high results.          Radiologic Studies:      Other:  Sputum culture        Assessment/Plan:  66 year old female with sarcoidosis and recurrent chest infections. 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 Also on chronic immunosuppression with methotrexate and prednisone (now just on prednisone).    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 Wanted Tussionex but discussed about side effects. Will try robitussin with codeine as alrternative    On lyrica for chronic cough.    Symptoms of tiredness worsened after COVID infection March 2020      Plan  1. Doxycycline 100 mg BID x 4 weeks  2. Robitussin w/codeine prn for cough  3. Discussed about avoiding IS for sarcoid at this time  4. Advised to go to ED if no improvement  5. Daytime sleepiness and snoring esp after weight gain: to get home sleep study for OSA  Return to clinic in: 3 months with PFt  Nicole Kindred, MD

## 2021-10-21 NOTE — Telephone Encounter (Signed)
Established with: [name] with PCP: 08/28/2021   Next OV with PCP: Visit date not found     Requested Medication(s):  Requested Prescriptions     Pending Prescriptions Disp Refills   . pregabalin (LYRICA) 75 MG capsule 90 capsule 2   . oxycodone-acetaminophen (PERCOCET) 10-325 MG tablet 30 tablet 0     Sig: Take 0.5 tablets by mouth every 6 hours as needed for Severe Pain (Pain Score 7-10).       Send to:     [id number] [place] [id number] Phone: [phone]: [address]), [place] [address] [address] [address] Phone: [phone]: [phone]      Last labs:   Lab Results   Component Value Date    CHOL 217 10/30/2020    HDL 72 10/30/2020    LDLCALC 119 10/30/2020    TRIG 129 10/30/2020    TSH 1.29 07/09/2020    A1C 7.2 (H) 04/02/2021        Blood Pressure   08/28/21 116/71   08/05/21 142/81   08/02/21 140/62        Health Maintenance Due   Topic Date Due   . Breast Cancer Screen  Never done   . COVID-19 Vaccine (1) Never done   . Shingles Vaccine (1 of 2) Never done   . Medicare Annual Wellness Visit  Never done   . PHQ9 Depression Monitoring doc flowsheet  07/30/2021   . Diabetic Foot Exam  09/18/2021   . Diabetes A1c  09/30/2021   . LDL Monitoring  10/30/2021   . Diabetes CMP  11/21/2021

## 2021-10-21 NOTE — Telephone Encounter (Signed)
ROI was faxed to [facility].    Location:    T:   F: [phone]    Requested:    Request latest CT Chest to be push to [place].

## 2021-10-21 NOTE — Addendum Note (Signed)
Addended byNicole Kindred on: 10/21/2021 02:21 PM     Modules accepted: Orders

## 2021-10-21 NOTE — Telephone Encounter (Signed)
From: Jacqueleen Calamia  To: [name], MD  Sent: 10/20/2021 2:29 PM PDT  Subject: I need refills on 2 Medications     [facility] [address] also I need a refill on my oxycodone acetaminophen 10-325 at [facility] on [facility] if you can call this in I will truly appreciate this thank you so much much

## 2021-10-21 NOTE — Addendum Note (Signed)
Addended by: Belva Crome on: 10/21/2021 02:19 PM     Modules accepted: Orders

## 2021-10-23 ENCOUNTER — Encounter (HOSPITAL_COMMUNITY): Payer: Self-pay

## 2021-10-23 ENCOUNTER — Encounter (INDEPENDENT_AMBULATORY_CARE_PROVIDER_SITE_OTHER): Payer: Self-pay | Admitting: Internal Medicine

## 2021-10-24 ENCOUNTER — Encounter (INDEPENDENT_AMBULATORY_CARE_PROVIDER_SITE_OTHER): Payer: Self-pay | Admitting: Hospital

## 2021-10-24 ENCOUNTER — Encounter (INDEPENDENT_AMBULATORY_CARE_PROVIDER_SITE_OTHER): Payer: Self-pay | Admitting: Student in an Organized Health Care Education/Training Program

## 2021-10-24 NOTE — Telephone Encounter (Signed)
From: Tnya Kutsch  To: Lorelee Cover, MD  Sent: 10/23/2021 11:11 AM PDT  Subject: Dexcom and refills     [facility] sent you a fax for my Dexcom supplies can you please fill it out and send back so I can get my supplies please also I [facility] and I need a refill on my Oxycodone Acetaminophen 10/325 at [facility] please can you please call those in for me please I am completely out of these medications thank you

## 2021-10-25 ENCOUNTER — Encounter (INDEPENDENT_AMBULATORY_CARE_PROVIDER_SITE_OTHER): Payer: Self-pay | Admitting: Internal Medicine

## 2021-10-25 ENCOUNTER — Telehealth (INDEPENDENT_AMBULATORY_CARE_PROVIDER_SITE_OTHER): Payer: Self-pay | Admitting: Pulmonary Medicine

## 2021-10-25 NOTE — Telephone Encounter (Signed)
HST instructions

## 2021-10-28 ENCOUNTER — Other Ambulatory Visit (INDEPENDENT_AMBULATORY_CARE_PROVIDER_SITE_OTHER): Payer: Self-pay | Admitting: Student in an Organized Health Care Education/Training Program

## 2021-10-28 ENCOUNTER — Encounter (INDEPENDENT_AMBULATORY_CARE_PROVIDER_SITE_OTHER): Payer: Self-pay | Admitting: Internal Medicine

## 2021-10-28 DIAGNOSIS — E119 Type 2 diabetes mellitus without complications: Secondary | ICD-10-CM

## 2021-10-28 NOTE — Telephone Encounter (Signed)
From: Varnell Shams  To: Lorelee Cover, MD  Sent: 10/25/2021 3:33 PM PDT  Subject: Urine tests and paper work     I will be in on Monday around 8:30 or 9:00 am to give Urine sample and fill out forms for Medicine if that's ok

## 2021-10-29 NOTE — Telephone Encounter (Signed)
Established with: Kimberly Curtis with PCP: 08/28/2021   Next OV with PCP: Visit date not found     Requested Medication(s):  Requested Prescriptions     Pending Prescriptions Disp Refills    BD PEN NEEDLE NANO 2ND GEN 32G X 4 MM MISC [Pharmacy Med Name: BD NANO 2 GEN PEN NDL 32G 4MM]  11     Sig: USE ONE PEN NEEDLE WITH EACH INSULIN ADMINISTRATION.       Send to:     [id number] [place], [place] [address] [id number] Phone: [phone]: [phone]   Last labs:   Lab Results   Component Value Date    CHOL 217 10/30/2020    HDL 72 10/30/2020    LDLCALC 119 10/30/2020    TRIG 129 10/30/2020    TSH 1.29 07/09/2020    A1C 7.2 (H) 04/02/2021        Blood Pressure   10/21/21 145/79   08/28/21 116/71   08/05/21 142/81        Health Maintenance Due   Topic Date Due    Breast Cancer Screen  Never done    COVID-19 Vaccine (1) Never done    Shingles Vaccine (1 of 2) Never done    Medicare Annual Wellness Visit  Never done    PHQ9 Depression Monitoring doc flowsheet  07/30/2021    Diabetic Foot Exam  09/18/2021    Diabetes A1c  09/30/2021    LDL Monitoring  10/30/2021    Diabetes CMP  11/21/2021    Influenza (1) 11/22/2021

## 2021-10-29 NOTE — Telephone Encounter (Signed)
From: Mileigh Calvey  To: Lorelee Cover, MD  Sent: 10/28/2021 3:34 PM PDT  Subject: Urine tests     I'm sorry I didn't get to come in today but I will be there in the morning had transportation issues today and could not make it but I will be in tomorrow morning for that test and fill out paperwork thank you so much

## 2021-10-29 NOTE — Telephone Encounter (Signed)
Kimberly Curtis, CPhT  (Rx Refill and PA Clinic)      Diabetic Supplies Refill Protocol    Last visit in enc specialty: 09/04/2021     Recent Visits in This Encounter Department       Date Provider Department Visit Type Primary Dx    09/04/2021 [name], MD [facility] Non-Face-to-Face Chronic pain syndrome    08/28/2021 [name], MD [facility] Visit Chronic cough    04/29/2021 [name], MD [facility] Non-Face-to-Face Chronic pain syndrome    04/16/2021 [name], MD [facility] Visit Type 2 diabetes mellitus with diabetic autonomic neuropathy, with long-term current use of insulin (CMS-HCC)    01/14/2021 [name], MD [facility] Visit Chronic pain syndrome           Population Health Visits  Recent [facility] Visits    None       Next appt in enc specialty: 12/20/2021      Future Appointments 10/29/2021 - 10/28/2026        Date Visit Type Department Provider     11/12/2021  6:30 PM [facility]    Appointment Notes:     HST- f/u with Dr. Cletis Media 11/8 for results             11/28/2021 12:00 PM NEW [place], [place], MD    Appointment Notes:     *New pt DH:WYSHUOHFGBMS ,Other chronic painAware of 30 min check , NPQ sent             12/02/2021 10:00 AM EP NEW [facility]  Arrive at: 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  yes    -----------------------------------------------------------------------------------------------------------    The above technician note was evaluated by the pharmacist.  Pharmacist Assessment and Plan:    Basal/bolus 4x day total    Lauralyn Primes, PharmD, [facility]   Refill and Prior [place]  Phone:  [phone]  Ext:  [phone]

## 2021-10-30 ENCOUNTER — Other Ambulatory Visit: Payer: No Typology Code available for payment source | Attending: Internal Medicine

## 2021-10-30 DIAGNOSIS — E119 Type 2 diabetes mellitus without complications: Secondary | ICD-10-CM

## 2021-10-30 DIAGNOSIS — M797 Fibromyalgia: Secondary | ICD-10-CM | POA: Insufficient documentation

## 2021-10-30 DIAGNOSIS — G894 Chronic pain syndrome: Secondary | ICD-10-CM | POA: Insufficient documentation

## 2021-10-30 LAB — UR DRUGS OF ABUSE SCREEN
Amphetamines Screen: NEGATIVE
Barbiturates Screen: NEGATIVE
Cocaine Screen: NEGATIVE
Methadone Screen: NEGATIVE
Opiates Screen: NEGATIVE
Oxycodone Screen: NEGATIVE
Phencyclidine Screen: NEGATIVE
THC Screen: NEGATIVE
UR Benzodiazepines High Sensitivity Screen: NEGATIVE
UR Fentanyl Screen: NEGATIVE

## 2021-10-30 MED ORDER — BD PEN NEEDLE NANO 2ND GEN 32G X 4 MM MISC
1.00 | Freq: Four times a day (QID) | 3 refills | Status: DC
Start: 2021-10-30 — End: 2022-04-22

## 2021-10-30 NOTE — Telephone Encounter (Signed)
No paperwork has been received as of yet

## 2021-10-30 NOTE — Interdisciplinary (Unsigned)
Pt came in for UA drug screen and signing of the Controlled substance agreement letter. To be scanned in chart and additionally requested refills of insulin and pen needles.

## 2021-10-31 ENCOUNTER — Other Ambulatory Visit (INDEPENDENT_AMBULATORY_CARE_PROVIDER_SITE_OTHER): Payer: Self-pay

## 2021-10-31 DIAGNOSIS — M797 Fibromyalgia: Secondary | ICD-10-CM

## 2021-10-31 DIAGNOSIS — E119 Type 2 diabetes mellitus without complications: Secondary | ICD-10-CM

## 2021-10-31 DIAGNOSIS — G894 Chronic pain syndrome: Secondary | ICD-10-CM

## 2021-10-31 MED ORDER — INSULIN GLARGINE SOLOSTAR 100 UNIT/ML SC SOPN
55.0000 [IU] | PEN_INJECTOR | Freq: Every evening | SUBCUTANEOUS | 3 refills | Status: DC
Start: 2021-10-31 — End: 2021-11-04

## 2021-10-31 MED ORDER — OXYCODONE-ACETAMINOPHEN 10-325 MG OR TABS
0.5000 | ORAL_TABLET | Freq: Four times a day (QID) | ORAL | 0 refills | Status: DC | PRN
Start: 2021-10-31 — End: 2021-12-02

## 2021-10-31 NOTE — Telephone Encounter (Signed)
Pt came in for drug screen and signed the controlled substance agreement on 10/30/21

## 2021-11-01 ENCOUNTER — Encounter (INDEPENDENT_AMBULATORY_CARE_PROVIDER_SITE_OTHER): Payer: Self-pay | Admitting: Internal Medicine

## 2021-11-01 ENCOUNTER — Encounter (HOSPITAL_BASED_OUTPATIENT_CLINIC_OR_DEPARTMENT_OTHER): Payer: Self-pay

## 2021-11-01 DIAGNOSIS — Z794 Long term (current) use of insulin: Secondary | ICD-10-CM

## 2021-11-04 ENCOUNTER — Encounter (INDEPENDENT_AMBULATORY_CARE_PROVIDER_SITE_OTHER): Payer: Self-pay | Admitting: Internal Medicine

## 2021-11-04 DIAGNOSIS — E119 Type 2 diabetes mellitus without complications: Secondary | ICD-10-CM

## 2021-11-04 DIAGNOSIS — G894 Chronic pain syndrome: Secondary | ICD-10-CM

## 2021-11-04 MED ORDER — INSULIN GLARGINE SOLOSTAR 100 UNIT/ML SC SOPN
55.0000 [IU] | PEN_INJECTOR | Freq: Every evening | SUBCUTANEOUS | 3 refills | Status: DC
Start: 2021-11-04 — End: 2021-12-20

## 2021-11-04 MED ORDER — DEXCOM G7 SENSOR MISC
Status: DC
Start: ? — End: 2021-11-04

## 2021-11-04 MED ORDER — DEXCOM G7 SENSOR MISC
1.0000 | 3 refills | Status: DC
Start: 2021-11-04 — End: 2022-10-15

## 2021-11-04 MED ORDER — DEXCOM G7 RECEIVER DEVI
Status: DC
Start: ? — End: 2021-11-04

## 2021-11-04 MED ORDER — DEXCOM G7 RECEIVER DEVI
1.0000 | Freq: Every day | 2 refills | Status: DC
Start: 2021-11-04 — End: 2022-10-15

## 2021-11-04 MED ORDER — PREGABALIN 75 MG OR CAPS
75.0000 mg | ORAL_CAPSULE | Freq: Three times a day (TID) | ORAL | 2 refills | Status: DC
Start: 2021-11-04 — End: 2021-11-26

## 2021-11-04 NOTE — Telephone Encounter (Signed)
From: Myanna Frankland  To: Lorelee Cover, MD  Sent: 11/04/2021 11:42 AM PDT  Subject: Refills    I need a refill on my prescription for Lantus and a refill on my Pregabalin and can you please send them to [facility] please thank you very much

## 2021-11-04 NOTE — Telephone Encounter (Signed)
From: Bernie Sweetland  To: Lorelee Cover, MD  Sent: 11/01/2021 10:18 AM PDT  Subject: Dexcom G7    I was upgraded from a Dexcom G6 to a Dexcom G7 and was told that I needed a new prescription from you and that they have fax over the request and have not received it back yet so can you please fax over that for me so they can send out my supplies , please make sure it's for the [place] thank you so much for all your support and efforts I truly appreciate it

## 2021-11-05 ENCOUNTER — Other Ambulatory Visit (INDEPENDENT_AMBULATORY_CARE_PROVIDER_SITE_OTHER): Payer: Self-pay | Admitting: Student in an Organized Health Care Education/Training Program

## 2021-11-05 DIAGNOSIS — G894 Chronic pain syndrome: Secondary | ICD-10-CM

## 2021-11-05 NOTE — Telephone Encounter (Signed)
Established with: Diaz-GonzalezTeresa Coombs with PCP: 08/28/2021   Next OV with PCP: Visit date not found     Requested Medication(s):  Requested Prescriptions     Pending Prescriptions Disp Refills    pregabalin (LYRICA) 75 MG capsule [Pharmacy Med Name: PREGABALIN 75 MG CAPSULE] 90 capsule      Sig: TAKE 1 CAPSULE BY MOUTH EVERY MORNING (BEFORE BREAKFAST) AND 2 CAPSULES EVERY EVENING FOR 30 DAYS.       [facility] to:   [id number] [place], [id number] Phone: [phone]: [phone]      Last labs:   Lab Results   Component Value Date    CHOL 217 10/30/2020    HDL 72 10/30/2020    LDLCALC 119 10/30/2020    TRIG 129 10/30/2020    TSH 1.29 07/09/2020    A1C 7.2 (H) 04/02/2021        Blood Pressure   10/21/21 145/79   08/28/21 116/71   08/05/21 142/81        Health Maintenance Due   Topic Date Due    Breast Cancer Screen  Never done    COVID-19 Vaccine (1) Never done    Shingles Vaccine (1 of 2) Never done    Medicare Annual Wellness Visit  Never done    PHQ9 Depression Monitoring doc flowsheet  07/30/2021    Diabetic Foot Exam  09/18/2021    Diabetes A1c  09/30/2021    LDL Monitoring  10/30/2021    Diabetes CMP  11/21/2021

## 2021-11-05 NOTE — Telephone Encounter (Signed)
Duplicate

## 2021-11-06 ENCOUNTER — Encounter (INDEPENDENT_AMBULATORY_CARE_PROVIDER_SITE_OTHER): Payer: Self-pay | Admitting: Hospital

## 2021-11-06 ENCOUNTER — Encounter (INDEPENDENT_AMBULATORY_CARE_PROVIDER_SITE_OTHER): Payer: No Typology Code available for payment source

## 2021-11-06 ENCOUNTER — Telehealth (INDEPENDENT_AMBULATORY_CARE_PROVIDER_SITE_OTHER): Payer: Self-pay

## 2021-11-06 NOTE — Telephone Encounter (Signed)
Called patient and left voicemail, changed upcoming appointment for HST class to 6:00 PM as instructor will not be available. Sent MyChart message as well.

## 2021-11-08 NOTE — Telephone Encounter (Signed)
Comprehensive Medication Review     Unable to contact patient via telephone to review medication list and any potential medication-related issues.  Will try again in future    Attempt: 2 of 3     Trampas Stettner PharmD, BCPS  Ambulatory Care Pharmacist (MTM)  [facility]  Ph: [phone]

## 2021-11-12 ENCOUNTER — Ambulatory Visit (INDEPENDENT_AMBULATORY_CARE_PROVIDER_SITE_OTHER): Payer: No Typology Code available for payment source | Admitting: Pulmonary Disease

## 2021-11-12 ENCOUNTER — Other Ambulatory Visit (INDEPENDENT_AMBULATORY_CARE_PROVIDER_SITE_OTHER): Payer: Self-pay | Admitting: Student in an Organized Health Care Education/Training Program

## 2021-11-12 DIAGNOSIS — G4733 Obstructive sleep apnea (adult) (pediatric): Secondary | ICD-10-CM

## 2021-11-12 DIAGNOSIS — R110 Nausea: Secondary | ICD-10-CM

## 2021-11-12 DIAGNOSIS — D869 Sarcoidosis, unspecified: Secondary | ICD-10-CM

## 2021-11-12 DIAGNOSIS — R059 Cough, unspecified: Secondary | ICD-10-CM

## 2021-11-12 NOTE — Procedures (Signed)
[facility]  [address]  Phone [phone]      MEDICAL EQUIPMENT DISCLOSURE      Kimberly Curtis Date of Birth: January 06, 1956  MRN [id number]      The equipment patient is taking is an [facility] 11/12/21:  He/She was referred for a home sleep test (HST) for symptoms and concerns associated with OSA. Education of the [employer] equipment was completed by Marcelene Butte, RPSGT.    Visit Summary: Instruction was given on the use and proper placement of each component of the HST.  After reviewing the procedure with the sleep tech, the patient verbally demonstrated how to properly place the HST device and all necessary components (respiratory belt, ECG, airflow/nasal pressure, and SpO2).  The patient was given an on call number of [phone] to call with any questions they may have.     This equipment is for diagnostic purposes only.    Further, your signature below indicates that patient has been instructed on operating the device.    Instructor's Name: Marcelene Butte, RPSGT      Date: 11/12/21    Electronically signed by, Marcelene Butte , RPSGT

## 2021-11-13 MED ORDER — ONDANSETRON 8 MG OR TBDP
ORAL_TABLET | ORAL | 0 refills | Status: DC
Start: 2021-11-13 — End: 2021-11-26

## 2021-11-13 NOTE — Telephone Encounter (Signed)
Zofran signed!

## 2021-11-13 NOTE — Telephone Encounter (Signed)
[facility]     Zofran is not currently included in the Pharmacy Refill Clinic protocols. Re-routing to the responsible staff for processing.  Thank you

## 2021-11-14 ENCOUNTER — Telehealth (INDEPENDENT_AMBULATORY_CARE_PROVIDER_SITE_OTHER): Payer: Self-pay | Admitting: Internal Medicine

## 2021-11-14 ENCOUNTER — Telehealth (INDEPENDENT_AMBULATORY_CARE_PROVIDER_SITE_OTHER): Payer: Self-pay

## 2021-11-14 NOTE — Telephone Encounter (Signed)
Please offer a repeat HST, not enough time was recorded.     Thank you

## 2021-11-14 NOTE — Telephone Encounter (Signed)
Prior Auth Request for Dexcom G7 Sensor has been placed in queue for processing. Please allow up to 72 business hours for initiation.     If this is an urgent request due to immediate therapy please re-route as high priority.       Thank you!     [facility]   Refill and Prior Auth Clinical Services

## 2021-11-14 NOTE — Telephone Encounter (Signed)
Left message for patient to call back t@ [phone] to schedule a repeat HST.       *call center please direct the patient to sleep lab

## 2021-11-15 ENCOUNTER — Encounter (INDEPENDENT_AMBULATORY_CARE_PROVIDER_SITE_OTHER): Payer: Self-pay

## 2021-11-15 NOTE — Telephone Encounter (Signed)
Prior Auth Request submitted for dexcom g7 on 11/15/21  via CMM Key # [id number]     ** Please allow 3-5 business days for determination from insurance**      Diagnosis:  dm2  ICD-10:    Medications Tried/Failed:    Justification:       PMAC will monitor status.     [facility]   Refill and Prior [place]

## 2021-11-18 NOTE — Telephone Encounter (Signed)
Prior Auth Request APPROVED for [employer]         Valid until       Pharmacy has been notified and will contact patient when ready to be picked up.     No further action needed, closing encounter.       Thank you!    [facility]   Refill and Prior [place]

## 2021-11-19 ENCOUNTER — Encounter (INDEPENDENT_AMBULATORY_CARE_PROVIDER_SITE_OTHER): Payer: Self-pay | Admitting: Internal Medicine

## 2021-11-19 DIAGNOSIS — I10 Essential (primary) hypertension: Secondary | ICD-10-CM

## 2021-11-20 ENCOUNTER — Encounter (INDEPENDENT_AMBULATORY_CARE_PROVIDER_SITE_OTHER): Payer: Self-pay | Admitting: Internal Medicine

## 2021-11-20 MED ORDER — LOSARTAN POTASSIUM 50 MG OR TABS
50.00 mg | ORAL_TABLET | Freq: Every day | ORAL | 0 refills | Status: DC
Start: 2021-11-20 — End: 2022-02-17

## 2021-11-20 NOTE — Telephone Encounter (Signed)
Previous order sent to wrong pharmacy. Please resend to requested pharmacy, thank you.

## 2021-11-20 NOTE — Telephone Encounter (Signed)
From: Oveda Mata  To: Lorelee Cover, MD  Sent: 11/20/2021 2:18 PM PDT  Subject: More pain meds    Dear Dr Patsey Berthold   I couple of weeks ago I had two root canals done and I was in so much pain I took more of the oxycodone than I normally take and today I had a tooth extracted and I'm in so much pain and I'm out of pills is it any way possible to send in a prescription for more pills the dentist told me to contact you to see if it's possible to get more I'm in so much pain and the 650 Tylenol is not working please help me

## 2021-11-20 NOTE — Telephone Encounter (Signed)
From: Kenyonna Omdahl  To: Lorelee Cover, MD  Sent: 11/19/2021 3:43 PM PDT  Subject: Out of Losartan     I'm out of my Losartan '50mg'$  can you please send in a prescription to [facility] on [address] please thank you

## 2021-11-22 NOTE — Telephone Encounter (Signed)
Called patient to coordinate repeat for HST. Left voicemail for call back.      Attn CC:       If patient return call please offer 09/06 or 09/07. Patient can come in anytime after 2:30 PM for pickup. Please do not add patient to schedule.

## 2021-11-26 ENCOUNTER — Other Ambulatory Visit (INDEPENDENT_AMBULATORY_CARE_PROVIDER_SITE_OTHER): Payer: Self-pay | Admitting: Student in an Organized Health Care Education/Training Program

## 2021-11-26 ENCOUNTER — Other Ambulatory Visit (INDEPENDENT_AMBULATORY_CARE_PROVIDER_SITE_OTHER): Payer: Self-pay | Admitting: Family

## 2021-11-26 DIAGNOSIS — G894 Chronic pain syndrome: Secondary | ICD-10-CM

## 2021-11-26 DIAGNOSIS — R110 Nausea: Secondary | ICD-10-CM

## 2021-11-26 MED ORDER — PREGABALIN 75 MG OR CAPS
ORAL_CAPSULE | ORAL | 0 refills | Status: DC
Start: 2021-11-26 — End: 2022-01-22

## 2021-11-26 MED ORDER — ONDANSETRON 8 MG OR TBDP
ORAL_TABLET | ORAL | 0 refills | Status: DC
Start: 2021-11-26 — End: 2021-12-20

## 2021-11-26 NOTE — Telephone Encounter (Signed)
Rx signed.

## 2021-11-26 NOTE — Telephone Encounter (Signed)
[facility]       Controlled substances (Refill and PA requests) are not currently included in Pharmacy Refill Clinic protocol. Re-routing to appropriate staff for processing. Thank you.

## 2021-11-26 NOTE — Progress Notes (Unsigned)
Rheumatology Outpatient Consultation    Patient Name: Kimberly Curtis  MRN: [id number]  DOB: 04-Oct-1955    Requesting Physician: [name], MD  Reason/Chief Complaint: "fibromyalgia referral"  Primary Rheumatologist: n/a    HPI: Mr Kimberly Curtis is a  66 year old years old  female who is here for rheumatology evaluation:     She was was initially referred over from the notes for fibromyalgia although she has been seen by a pain specialist who has been prescribing her Lyrica as well as hydrocodone and has referred her over to [facility]. I suspect the main reason for the referral is really related to the chronic pain in the setting of the history of sarcoidosis.     She has a rather interesting history in the late 80s and early 90s she was having a lot of abdominal issues. She had nausea and pain and they did a CT scan of the abdomen and pelvis where incidentally the presence of pulmonary nodules were noted. They had her undergo a biopsy when she was residing in [place] to make sure that this was not lung cancer. This then resulted in the diagnosis of sarcoidosis. She never required any treatment for it because she was not having any issues with cough or shortness of breath.     Around 2012 she begins to start getting mild and recurrent pneumonias. She states it started to build up further and she gradually declined from there. She began to see a pulmonologist in [place] sometime in 2018. She notes that at one point he then started her on prednisone along with methotrexate. She was on the drug for about 2 years before it was discontinued last year. The main reason for the discontinuation was that she noted that there was no improvement in terms of her overall breathing issues. She states that her pulmonologist there also ended up doing another bronch for her and found that she had purulent mucus production and was concerned for infection. She also had significant mucus plugging and at one point  she had an area of bronchial collapse for which she had a bronchial stent placed. This stent eventually had to be removed due to bleeding in February of 2022.     She then moved here permanently in [place] in 2022. She has established care of late with Dr. Nicole Kindred here at [place]. She notes that she has been on oxygen though for approximately 1 year. This is as a result of again recurrent infections. In May of 2022 she was hospitalized as well in the ICU for altered mental status and sepsis again. At the time it showed the presence of MSSA pneumonia which on most recent repeat here in May she is also had presence of heavy MSSA staph for which she has completed 1 month of antibiotics as of yesterday. She has continued to still have productive sputum and cough for the last 6-8 months now.  She notes that Dr. Cletis Media feels infections seems to be a big part of what is going on and she had prednisone tapered off this past year.  She does report lots of recurrent infections as a child, both pneumonia and ear infectionss    In general she believes that the sarcoidosis has been primarily been in her lungs. She does have a history of V-tach and an AICD placed. She had an MRI done at [facility] for her heart and they told her that there was no evidence to suggest sarcoid involvement in  the cardiac tissue. She never had any uveitis or iritis and she had her eyes checked for that as well. She does not have any other rashes. She notes that she has had chronic pain for some time now. She is been diagnosed with fibromyalgia. She does have subjective swelling that comes and goes in her hands as well as her feet. She also has discomfort in the chest area. She describes it as an overall soreness as though she was been bruised. There was no specific pattern but the pain seems to be more severe at times in the mornings and in the middle of the night.     FMS risks   (+) poor sleep, due pain   (+)headaches, more of late tension  type daily   (+)IBS - diarrhea and constipation, egd/colonoscopy only benign polyp, +bloating, nausea  (-) IC  (+)anxiety     ROS:   General: No unintentional weight loss, +fatigue, fever +gain of weight due to steroids  Eyes: No sicca symptoms, no eye inflammation, no visual loss or changes  ENT: No recurrent epistaxis, sinsuitis, No tinnitus, no hearing loss, recurrent ear infections, No dry mouth, no mouth sores or ulcers   Respiratory: ++productive cough with sputum, +SOB on oxygen   Cardiac: No chest pain, no tachycardia, arrhythmia +tachyarrhythmia   Gastrointestinal: No melena, +hematochezia, +diarrhea, +constipation, reflux, dysphagia  Genital/urinary: No Hematuria, dysuria, genital sores or ulcers , +UTI   Musculoskeletal: see HPI   Hematologic/lymphadenopathy: No blood clots, easy bruising, bleeding, cytopenias, lymphadenopathy   Dermatologic: No photosensitivity, rashes, alopecia areata, psoriasis, digital pits, ulcers, telangiectasias, nail disease   Vascular: No raynaud, claudication  Neuro: No headaches, paresthesias, weakness, dysarthria, tremor   Extremities: No cyanosis, edema   Endocrinology: No heat or cold intolerance  Psychology: No depression or anxiety      ADULT ILLNESS/PAST MEDICAL HISTORY:  Diabetes mellitus   HTN   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  Recurrent infections both as a child   Chronic pain -fibromyalgia   Anxiety   Tachycardia with aicd placement - June of 2022 at [facility] (MRI of cardiac no sarcoid)  GERD    SURGERIES:  Past Surgical History:   Procedure Laterality Date    AICD pacemaker N/A 09/2018    CHOLECYSTECTOMY      HYSTERECTOMY      KNEE ARTHROSCOPY Left      SOCIAL HISTORY:  Occupation: disabled since 2013, chef   Tobacco: used to quit in 1996  Alcohol: occasional, none now   Other drugs: gummies cbd./thc per pain doctor   Married - 4 kids     FAMILY HISTORY:  Father: HTN, alzheimers  Mother: diabetic, HTN, heart   Siblings: 10 siblings - 2 deceased (heart  disease/CABG, recurrent CVA),   Other: asthma in 71 of 47, oldest daughter with kidney issues (stones)  Grandmother had cancer found in the bone.      ALLERGIES:Sulfa drugs, Benadryl [diphenhydramine], Latex, Macrolides, and Morphine     MEDICATIONS:  Current Outpatient Medications   Medication Sig    albuterol (PROVENTIL) (2.5 MG/3ML) 0.083% nebulization 3 mL (2.5 mg) by Nebulization route every 4 hours as needed for Wheezing.    albuterol 108 (90 Base) MCG/ACT inhaler Inhale 2 puffs by mouth every 6 hours as needed for Wheezing.    Albuterol Sulfate 108 (90 Base) MCG/ACT AEPB Inhale 1 puff by mouth as needed.    ASPIRIN LOW DOSE 81 MG EC tablet TAKE 1 TABLET BY MOUTH EVERY  DAY    atorvastatin (LIPITOR) 40 MG tablet Take 1 tablet (40 mg) by mouth daily.    continuous blood glucose (DEXCOM G7) receiver 1 Device by IntraDERMAL route daily. Use daily to read glucose topically.    continuous blood glucose (DEXCOM G7) sensor Inject 1 Device under the skin every 10 days for 30 days. Insert subcutaneously every 10 days.    controlled substance agreement controlled substance agreement    doxycycline (MONODOX) 100 MG capsule Take 1 capsule (100 mg) by mouth 2 times daily.    famotidine (PEPCID) 40 MG tablet Take 1 tablet (40 mg) by mouth daily.    fexofenadine (ALLEGRA) 180 MG tablet Take 1 tablet (180 mg) by mouth daily.    fluticasone-umeclidinium-vilanterol (TRELEGY ELLIPTA) 100-62.5-25 MCG/INH AEPB Inhale 1 puff by mouth daily.    folic acid (FOLVITE) 1 MG tablet TAKE 1 TABLET BY MOUTH EVERY DAY    guaiFENesin-codeine (ROBITUSSIN AC) 100-10 MG/5ML oral solution Take 5 mL by mouth every 6 hours as needed for Cough.    hydrOXYzine HCL (ATARAX) 50 MG tablet Take 1 tablet (50 mg) by mouth 2 times daily as needed for Itching. (Patient taking differently: Take 1 tablet (50 mg) by mouth 2 times daily as needed for Itching. hives)    Insulin Glargine Solostar (LANTUS SOLOSTAR) 100 UNIT/ML SOPN Inject 55 Units under the skin  nightly.    insulin lispro, 1 Unit Dial, (HUMALOG KWIKPEN) 100 units/mL INJECT 20 UNITS UNDER THE SKIN 3 TIMES DAILY AS NEEDED FOR HIGH BLOOD SUGAR. IN THE MORNING 20 UNITS + SLIDING SCALE MID DAY: 17 + SLIDING SCALE AND IN THE EVENING 12 + SLIDING SCALE    Insulin Pen Needles (BD PEN NEEDLE NANO 2ND GEN) 32G X 4 MM MISC Inject 1 each under the skin 4 times daily. With each insulin administration    losartan (COZAAR) 50 MG tablet Take 1 tablet (50 mg) by mouth daily.    metoprolol tartrate (LOPRESSOR) 50 MG tablet Take 1 tablet (50 mg) by mouth 2 times daily.    mirtazapine (REMERON) 30 MG tablet TAKE 1 TABLET BY MOUTH NIGHTLY    montelukast (SINGULAIR) 10 MG tablet Take 1 tablet (10 mg) by mouth every evening.    mupirocin (BACTROBAN) 2 % ointment APPLY 1 APPLICATION TOPICALLY 2 TIMES DAILY. USE A SMALL AMOUNT AS DIRECTED    naloxone (NARCAN) 4 mg/0.1 mL nasal spray Spray 1 spray into one nostril once as needed.    ondansetron (ZOFRAN ODT) 8 MG disintegrating tablet TAKE 1 TABLET ON OR UNDER THE TONGUE EVERY 8 HOURS AS NEEDED FOR NAUSEA VOMITING.    oxycodone-acetaminophen (PERCOCET) 10-325 MG tablet Take 0.5 tablets by mouth every 6 hours as needed for Severe Pain (Pain Score 7-10).    pantoprazole (PROTONIX) 40 MG tablet Take 1 tablet (40 mg) by mouth every morning (before breakfast).    pregabalin (LYRICA) 75 MG capsule TAKE 1 CAPSULE BY MOUTH EVERY MORNING (BEFORE BREAKFAST) AND 2 CAPSULES EVERY EVENING FOR 30 DAYS.     No current facility-administered medications for this visit.        PHYSICAL EXAM  Vitals:    11/28/21 1327   BP: 140/71   BP Location: Right arm   BP Patient Position: Sitting   BP cuff size: Large   Pulse: 77   Resp: 18   Temp: 97 F (36.1 C)   TempSrc: Temporal   SpO2: 99%   Weight: 99.1 kg (218 lb 7.6 oz)   Height: 5'  5" (1.651 m)        GENERAL: NAD  SKIN: no psoriasis, no rash, no palpable purpura, no skin thickening, no telangiectasias,  no livedo, no Raynaud, no bruises.   NAILS: no  periungual erythema, no digital ischemia, no nail pitting, normal nailbed, no splinter hemorrhages.   HEENT: unremarkable, specifically normal saliva pool under tongue, no allopecia, no oral ulcers, no eye infammation  NECK: +palpable adenopathy   CARDIAC: regular rate an rhythm without murmur.  CHEST: breaths sounds clear bilaterally, no rales or wheezes.  ABDOMEN: deferred  EXTREMITIES: no edema, swelling or ulceration, clubbing, cyanosis  NEUROLOGICAL: alert and oriented. Grossly intact   MUSCULOSKELETAL:              Shoulder: no tenderness of shoulders and bicipital tendons, no swelling, no end-point pain. full range of motion              Elbow: no rheumatic nodules, no tophi, no warmth swelling, tenderness               Wrists:FROM no warmth or swelling, synovitis or tenderness.                Hands: there is synovial thickening of the left 2nd MCP and right 3/5 MCP              Back: no midline pain, +paraspinal pain              Hips: deferred              Knees: cold, no effusion, normal range for motion, no quad or patella pain.                Ankles: FROM : no warmth, swelling or synovitis, no achilles tendonitis              feet:left foot more thickeing of the 2/3/4 MTP       LABS:      IMAGING:                     ASSESSMENT:    Ms. Pralle is a very lovely 67 year old female diagnosis of fibromyalgia based upon notable tender spots as well as poor sleep, recurrent headaches, IBS symptoms and some mild anxiety. That being said she certainly does have a very complicated sarcoidosis history that seems to be pulmonary limited at this time. Additionally there is also a notable history of recurrent bacterial infections of recent.  She was also told me that she has had recurrent bacterial infections growing up with lots of ear infections as a child. For this reason further immune deficiency should be pursued. For now I do suspect that there could be some sarcoid involvement of the joints as there some mild  thickening of a few for MCPs as well as the MTP joints on her left foot. I will obtain some baseline labs for the patient as well as some screening tests. The most difficult thing is really addressing the recurrent infection for her. Any potential treatment of possible sarcoid activity we will rely on the ability of [place] to make sure that she is been adequately treated from an infection standpoint. I explained the complexity of this to the patient and her husband. They are understanding of the plan and for now I do think that her pain management specialist could also further increase the Lyrica to help with managing overall generalized pain while we await the treatment of her MSSA infection.  PLAN:     #pulmonary limited sarcoidosis   -f/u with Pulmonary team Dr. Cletis Media  -PFTS in 01/2022  -check ACE level   -[facility] of heart to assure no cardiac involvement.   -needs a calcium level -check cmp      #MSSA s/p doxycycline for last month, no benefit  -f/u with pulmonary  -check quantitative immunoglobulins and immunofixation, If low would benefit from vaccine challenge and immunology referral       #joint pain in setting of FMS, sarcoidosis  -check xray of hands   -rf/ccp, ANA  -TSH  -ESR/CRP  -TSH    #chronic low back pain - hx of 2 years of steroid use  -check xray to assure no fractures    #medication monitoring  -cbc, cmp    #Fibromyalgia-   -counsel on sleep, stress reduction, weight loss, exercise and diet.   -suggest sleep study if not done already   -medication management can help, but explained that pain symptoms likely will still remain to some degree.  Medications can sometimes help and selection should be based upon the most active symptom at the time.  For example:       1. Headaches: consider HA meds - topamax       2. Depression: cymbalta, savella       3. Nerve like pain: gabapentin, lyrica        4. Muscle tightness or soreness: cyclobenzaprine or other muscle relaxant        5. Insomnia: trazodone   -incorporate integrative approach: diet, aquatherapy or other preferred pain management modalities such as acupuncture.    -f/u with PCP and or pain specialist with fibromyalgia management moving forward   -could consider dose increase in the lyrica for the pain to 175m bid - defer to PCP and pain management.     RTC in 8 weeks.     Time spent review of records: 167ms   Time in office face to face time: 606m  Time spent post office visit : placement of orders and tests 5mi76m Total time : 75mi24m   DoQuyGeorga KaufmannAssociate Clinical Professor  Division of Rheumatology

## 2021-11-28 ENCOUNTER — Encounter (INDEPENDENT_AMBULATORY_CARE_PROVIDER_SITE_OTHER): Payer: Self-pay

## 2021-11-28 ENCOUNTER — Telehealth (INDEPENDENT_AMBULATORY_CARE_PROVIDER_SITE_OTHER): Payer: Self-pay | Admitting: Internal Medicine

## 2021-11-28 ENCOUNTER — Other Ambulatory Visit (INDEPENDENT_AMBULATORY_CARE_PROVIDER_SITE_OTHER): Payer: No Typology Code available for payment source | Attending: Internal Medicine

## 2021-11-28 ENCOUNTER — Encounter (INDEPENDENT_AMBULATORY_CARE_PROVIDER_SITE_OTHER): Payer: Self-pay | Admitting: Internal Medicine

## 2021-11-28 ENCOUNTER — Ambulatory Visit (INDEPENDENT_AMBULATORY_CARE_PROVIDER_SITE_OTHER): Payer: No Typology Code available for payment source | Admitting: Internal Medicine

## 2021-11-28 VITALS — BP 140/71 | HR 77 | Temp 97.0°F | Resp 18 | Ht 65.0 in | Wt 218.5 lb

## 2021-11-28 DIAGNOSIS — M2550 Pain in unspecified joint: Secondary | ICD-10-CM | POA: Insufficient documentation

## 2021-11-28 DIAGNOSIS — G8929 Other chronic pain: Secondary | ICD-10-CM

## 2021-11-28 DIAGNOSIS — D869 Sarcoidosis, unspecified: Secondary | ICD-10-CM | POA: Insufficient documentation

## 2021-11-28 DIAGNOSIS — M797 Fibromyalgia: Secondary | ICD-10-CM

## 2021-11-28 DIAGNOSIS — J1520 Pneumonia due to staphylococcus, unspecified: Secondary | ICD-10-CM

## 2021-11-28 DIAGNOSIS — M545 Low back pain, unspecified: Secondary | ICD-10-CM

## 2021-11-28 LAB — COMPREHENSIVE METABOLIC PANEL, BLOOD
ALT (SGPT): 18 U/L (ref 0–33)
AST (SGOT): 24 U/L (ref 0–32)
Albumin: 4.1 g/dL (ref 3.5–5.2)
Alkaline Phos: 121 U/L (ref 40–130)
Anion Gap: 13 mmol/L (ref 7–15)
BUN: 13 mg/dL (ref 8–23)
Bicarbonate: 29 mmol/L (ref 22–29)
Bilirubin, Tot: 0.32 mg/dL (ref <1.2)
Calcium: 9.7 mg/dL (ref 8.5–10.6)
Chloride: 100 mmol/L (ref 98–107)
Creatinine: 0.76 mg/dL (ref 0.51–0.95)
Glucose: 240 mg/dL — ABNORMAL HIGH (ref 70–99)
Potassium: 4.3 mmol/L (ref 3.5–5.1)
Sodium: 142 mmol/L (ref 136–145)
Total Protein: 7.1 g/dL (ref 6.0–8.0)
eGFR Based on CKD-EPI 2021 Equation: >60 mL/min/{1.73_m2}

## 2021-11-28 LAB — IMMUNOGLOBULIN PANEL (IGA,IGG,IGM), BLOOD
IGA: 143 mg/dL (ref 70–400)
IGG: 961 mg/dL (ref 700–1600)
IGM: 29 mg/dL — ABNORMAL LOW (ref 40–230)

## 2021-11-28 LAB — CBC WITH DIFF, BLOOD
ANC-Automated: 3.1 10*3/uL (ref 1.6–7.0)
Abs Basophils: 0.1 10*3/uL (ref <0.2)
Abs Eosinophils: 0.2 10*3/uL (ref 0.0–0.5)
Abs Lymphs: 1.4 10*3/uL (ref 0.8–3.1)
Abs Monos: 0.5 10*3/uL (ref 0.2–0.8)
Basophils: 1 %
Eosinophils: 3 %
Hct: 33.6 % — ABNORMAL LOW (ref 34.0–45.0)
Hgb: 10.3 gm/dL — ABNORMAL LOW (ref 11.2–15.7)
Lymphocytes: 27 %
MCH: 26.7 pg (ref 26.0–32.0)
MCHC: 30.7 g/dL — ABNORMAL LOW (ref 32.0–36.0)
MCV: 87 um3 (ref 79.0–95.0)
MPV: 11 fL (ref 9.4–12.4)
Monocytes: 10 %
Plt Count: 271 10*3/uL (ref 140–370)
RBC: 3.86 10*6/uL — ABNORMAL LOW (ref 3.90–5.20)
RDW: 13.6 % (ref 12.0–14.0)
Segs: 59 %
WBC: 5.3 10*3/uL (ref 4.0–10.0)

## 2021-11-28 LAB — IGE, BLOOD: IGE: 5 [IU]/mL (ref 0–99)

## 2021-11-28 LAB — SED RATE, BLOOD: Sed Rate: 26 mm/hr (ref 0–30)

## 2021-11-28 LAB — C-REACTIVE PROTEIN, BLOOD: CRP: 0.78 mg/dL — ABNORMAL HIGH (ref <0.5)

## 2021-11-28 LAB — CYCLIC CITRUL PEP AB, IGG: Cyclic Citrul PEP AB, IGG: <8 U/mL

## 2021-11-28 LAB — RF (RHEUMATOID FACTOR), BLOOD: RF: <10 [IU]/mL (ref 0–13)

## 2021-11-28 LAB — TSH, BLOOD: TSH: 1.56 u[IU]/mL (ref 0.27–4.20)

## 2021-11-28 NOTE — Telephone Encounter (Signed)
called and left message to confirm appt.

## 2021-11-29 LAB — ANGIOTENSIN CONVERTING ENZYME, BLOOD: ACE: 41 U/L (ref 16–85)

## 2021-11-29 LAB — ANA (ANTI-NUCLEAR AB), BLOOD: ANA (Anti-Nuclear Ab): NEGATIVE

## 2021-12-02 ENCOUNTER — Ambulatory Visit
Payer: No Typology Code available for payment source | Attending: Student in an Organized Health Care Education/Training Program | Admitting: Clinical Cardiac Electrophysiology

## 2021-12-02 ENCOUNTER — Telehealth (INDEPENDENT_AMBULATORY_CARE_PROVIDER_SITE_OTHER): Payer: Self-pay

## 2021-12-02 ENCOUNTER — Encounter (HOSPITAL_COMMUNITY): Payer: Self-pay

## 2021-12-02 ENCOUNTER — Encounter (HOSPITAL_COMMUNITY): Payer: Self-pay | Admitting: Clinical Cardiac Electrophysiology

## 2021-12-02 ENCOUNTER — Encounter (INDEPENDENT_AMBULATORY_CARE_PROVIDER_SITE_OTHER): Payer: Self-pay | Admitting: Internal Medicine

## 2021-12-02 VITALS — BP 153/81 | HR 77 | Temp 97.3°F | Resp 18 | Ht 65.0 in | Wt 219.0 lb

## 2021-12-02 DIAGNOSIS — I10 Essential (primary) hypertension: Secondary | ICD-10-CM | POA: Insufficient documentation

## 2021-12-02 DIAGNOSIS — Z950 Presence of cardiac pacemaker: Secondary | ICD-10-CM | POA: Insufficient documentation

## 2021-12-02 DIAGNOSIS — G894 Chronic pain syndrome: Secondary | ICD-10-CM

## 2021-12-02 DIAGNOSIS — Z95810 Presence of automatic (implantable) cardiac defibrillator: Secondary | ICD-10-CM | POA: Insufficient documentation

## 2021-12-02 DIAGNOSIS — D8685 Sarcoid myocarditis: Secondary | ICD-10-CM | POA: Insufficient documentation

## 2021-12-02 DIAGNOSIS — M797 Fibromyalgia: Secondary | ICD-10-CM

## 2021-12-02 LAB — QUANTIFERON-TB, BLOOD
Result: NEGATIVE
TB1 Minus Nil: -0.02 [IU]/mL
TB2 minus Nil: -0.02 [IU]/mL

## 2021-12-02 LAB — IMMUNOFIXATION, BLOOD

## 2021-12-02 MED ORDER — OXYCODONE-ACETAMINOPHEN 10-325 MG OR TABS
0.5000 | ORAL_TABLET | Freq: Four times a day (QID) | ORAL | 0 refills | Status: DC | PRN
Start: 2021-12-02 — End: 2022-01-09

## 2021-12-02 NOTE — Telephone Encounter (Signed)
Pt was calling in reference to message from 8/24 and she will pick up the machine at the clinic. Message sent as an FYI. Thank you.    [phone]

## 2021-12-02 NOTE — Patient Instructions (Signed)
Dr. Serita Butcher follow-up appointment 06/09/2022 at 12:40PM

## 2021-12-02 NOTE — Telephone Encounter (Signed)
Established with: Diaz-GonzalezTeresa Coombs with PCP: Visit date not found   Next OV with PCP: 12/20/2021   Last refill: 10/31/21      Requested Medication(s):  Requested Prescriptions     Pending Prescriptions Disp Refills    oxycodone-acetaminophen (PERCOCET) 10-325 MG tablet 30 tablet 0     Sig: Take 0.5 tablets by mouth every 6 hours as needed for Severe Pain (Pain Score 7-10).       Send to:   [facility] ([facility] [address]  [place] [address] [place] [address]  Phone: [phone] Fax: [phone]      Last labs:   Lab Results   Component Value Date    CHOL 217 10/30/2020    HDL 72 10/30/2020    LDLCALC 119 10/30/2020    TRIG 129 10/30/2020    TSH 1.56 11/28/2021    A1C 7.2 (H) 04/02/2021        Blood Pressure   12/02/21 153/81   11/28/21 140/71   10/21/21 145/79        Health Maintenance Due   Topic Date Due    Breast Cancer Screen  Never done    COVID-19 Vaccine (1) Never done    Shingles Vaccine (1 of 2) Never done    Medicare Annual Wellness Visit  Never done    PHQ9 Depression Monitoring doc flowsheet  07/30/2021    Diabetic Foot Exam  09/18/2021    Diabetes A1c  09/30/2021    Influenza (1) Never done    LDL Monitoring  10/30/2021

## 2021-12-02 NOTE — Progress Notes (Signed)
[facility] ([place] C[facility]ROP[facility]SIOLOGY  NEW PATIENT CONSULTATION    Encounter Date: 12/02/2021    Demographics:  Patient Name: Kimberly Curtis   Medical Record #: [id number]   DOB: Jun 06, 1955  Age: 66 year old  Sex: female    Providers:      [name]    Clinic Location:      [facility]  [place]  [address]  [place] [address]    I had the pleasure of seeing Kimberly Curtis for followup consultation at the [facility] on 12/02/2021.     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 She has had recurrent infections. They took her off the prednisone to allow her to fight the infections better,. She is also now on home O2.    She denied any further ICD shocks. Occasionally feels palpitations.    PAST MEDICAL HISTORY:   Past Medical History:   Diagnosis Date    Diabetes mellitus (CMS-HCC)     Hypertension     Sarcoidosis of lung (CMS-HCC)     bilat    Tachycardia 09/2018       ALLERGIES:   Nsaids, Sulfa drugs, Benadryl [diphenhydramine], Latex, Macrolides, and Morphine    MEDICATIONS:   Current Outpatient Medications on File Prior to Visit   Medication Sig Dispense Refill    albuterol (PROVENTIL) (2.5 MG/3ML) 0.083% nebulization 3 mL (2.5 mg) by Nebulization route every 4 hours as needed for Wheezing. 1 each 2    albuterol 108 (90 Base) MCG/ACT inhaler Inhale 2 puffs by mouth every 6 hours as needed for Wheezing. 1 each 0    Albuterol Sulfate 108 (90 Base) MCG/ACT AEPB Inhale 1 puff by mouth as needed.      ASPIRIN LOW DOSE 81 MG EC tablet TAKE 1 TABLET BY MOUTH EVERY DAY 120 tablet 1    atorvastatin (LIPITOR) 40 MG tablet Take 1 tablet (40 mg) by mouth daily. 90 tablet 1    [DISCONTINUED] Continuous Blood Gluc Receiver  (CONTINUOUS BLOOD GLUCOSE, DEXCOM G6, RECEIVER) Use daily to read glucose topically 1 each 0    [DISCONTINUED] Continuous Blood Gluc Sensor (CONTINUOUS BLOOD GLUCOSE, DEXCOM G6, SENSOR) Insert subcutaneously every 10 days. 10 each 4    [DISCONTINUED] Continuous Blood Gluc Transmit (CONTINUOUS BLOOD GLUCOSE, DEXCOM G6, TRANSMITTER) Use daily to transmit subcutaneous glucose value. Change every 3 months. 1 each 4    continuous blood glucose (DEXCOM G7) receiver 1 Device by IntraDERMAL route daily. Use daily to read glucose topically. 1 each 2    continuous blood glucose (DEXCOM G7) sensor Inject 1 Device under the skin every 10 days for 30 days. Insert subcutaneously every 10 days. 3 kit 3    controlled substance agreement controlled substance agreement 1 each 0    doxycycline (MONODOX) 100 MG capsule Take 1 capsule (100 mg) by mouth 2 times daily. 60 capsule 1    famotidine (PEPCID) 40 MG tablet Take 1 tablet (40 mg) by mouth daily. 90 tablet 1    fexofenadine (ALLEGRA) 180 MG tablet Take 1 tablet (180 mg) by mouth daily. 90 tablet 1    fluticasone-umeclidinium-vilanterol (TRELEGY ELLIPTA) 100-62.5-25 MCG/INH AEPB Inhale 1 puff by mouth daily.      folic acid (FOLVITE) 1  MG tablet TAKE 1 TABLET BY MOUTH EVERY DAY 90 tablet 1    guaiFENesin-codeine (ROBITUSSIN AC) 100-10 MG/5ML oral solution Take 5 mL by mouth every 6 hours as needed for Cough. 120 mL 0    hydrOXYzine HCL (ATARAX) 50 MG tablet Take 1 tablet (50 mg) by mouth 2 times daily as needed for Itching. (Patient taking differently: Take 1 tablet (50 mg) by mouth 2 times daily as needed for Itching. hives) 180 tablet 3    Insulin Glargine Solostar (LANTUS SOLOSTAR) 100 UNIT/ML SOPN Inject 55 Units under the skin nightly. 15 mL 3    insulin lispro, 1 Unit Dial, (HUMALOG KWIKPEN) 100 units/mL INJECT 20 UNITS UNDER THE SKIN 3 TIMES DAILY AS NEEDED FOR HIGH BLOOD SUGAR. IN THE MORNING 20 UNITS + SLIDING SCALE MID DAY: 17 + SLIDING SCALE AND IN THE EVENING 12 +  SLIDING SCALE 15 mL 11    Insulin Pen Needles (BD PEN NEEDLE NANO 2ND GEN) 32G X 4 MM MISC Inject 1 each under the skin 4 times daily. With each insulin administration 400 each 3    losartan (COZAAR) 50 MG tablet Take 1 tablet (50 mg) by mouth daily. 90 tablet 0    metoprolol tartrate (LOPRESSOR) 50 MG tablet Take 1 tablet (50 mg) by mouth 2 times daily. 180 tablet 3    mirtazapine (REMERON) 30 MG tablet TAKE 1 TABLET BY MOUTH NIGHTLY 90 tablet 1    montelukast (SINGULAIR) 10 MG tablet Take 1 tablet (10 mg) by mouth every evening. 90 tablet 3    mupirocin (BACTROBAN) 2 % ointment APPLY 1 APPLICATION TOPICALLY 2 TIMES DAILY. USE A SMALL AMOUNT AS DIRECTED 22 g 1    naloxone (NARCAN) 4 mg/0.1 mL nasal spray Spray 1 spray into one nostril once as needed.      [DISCONTINUED] NOVOLOG FLEXPEN 100 UNIT/ML injection pen INJECT 20 UNITS UNDER THE SKIN 3 TIMES DAILY AS NEEDED FOR HIGH BLOOD SUGAR. IN THE MORNING 20 UNITS + SLIDING SCALE MID DAY: 17 + SLIDING SCALE AND IN THE EVENING 12 + SLIDING SCALE 15 each 11    ondansetron (ZOFRAN ODT) 8 MG disintegrating tablet TAKE 1 TABLET ON OR UNDER THE TONGUE EVERY 8 HOURS AS NEEDED FOR NAUSEA VOMITING. 30 tablet 0    oxycodone-acetaminophen (PERCOCET) 10-325 MG tablet Take 0.5 tablets by mouth every 6 hours as needed for Severe Pain (Pain Score 7-10). 30 tablet 0    pantoprazole (PROTONIX) 40 MG tablet Take 1 tablet (40 mg) by mouth every morning (before breakfast). 90 tablet 3    pregabalin (LYRICA) 75 MG capsule TAKE 1 CAPSULE BY MOUTH EVERY MORNING (BEFORE BREAKFAST) AND 2 CAPSULES EVERY EVENING FOR 30 DAYS. 90 capsule 0    [DISCONTINUED] promethazine-dextromethorphan (PROMETHAZINE-DM) 6.25-15 MG/5ML syrup Take 5 mL by mouth every 6 hours as needed for Cough. 118 mL 0    [DISCONTINUED] sertraline (ZOLOFT) 100 MG tablet Take 1 tablet in the morning 90 tablet 1     No current facility-administered medications on file prior to visit.       REVIEW OF SYSTEMS:  General: No weight  changes, fevers, or chills.   Eyes: No visual changes.   GI: No melena, bright red blood per rectum, or abdominal pain.  GU: No hematuria.  Pulmonary: No coughing or wheezing.   Musculoskeletal: No myalgias or arthralgias.   Hematologic: No easy bruising or abnormal bleeding.   Endocrine: Normal.   Neurologic: No new headaches, focal weakness, numbness, or tingling.  All other review of systems were negative, please see HPI.     PHYSICAL EXAMINATION:  BP 153/81 (BP Location: Left arm, BP Patient Position: Sitting, BP cuff size: Large)   Pulse 77   Temp 97.3 F (36.3 C) (Temporal)   Resp 18   Ht 5' 5" (1.651 m)   Wt 99.3 kg (219 lb)   SpO2 100%   BMI 36.44 kg/m   Body mass index is 36.44 kg/m.  GENERAL APPEARANCE: no apparent distress noted.  HEAD: Normocephalic, atraumatic.   EYES: Normal conjunctivae, no scleral icterus. EOMI.  NECK: No thyromegaly or lymphadenopathy.  RESPIRATORY: Clear to auscultation bilaterally with no crackles or wheezes, normal respiratory effort.  CARDIOVASCULAR: Apical impulse non-sustained and non-displaced.  Regular rate and rhythm; normal S1, S2; no murmurs, rubs, or gallops; jugular venous pressure normal, no hepatojugular reflux. Carotid pulse is 2+, no bruits.   GI/ABDOMEN: soft, non-tender; no hepatosplenomegaly; normal bowel sounds.  EXTREMITIES: No edema. 2+ DP and PT pulses bilaterally. No clubbing or cyanosis.  SKIN: No rash. Warm, well-perfused.    NEUROLOGIC: Alert and oriented X 3.   Normal gait.  PSYCHIATRIC: Normal speech and affect.     DATA ANALYSIS:   ECG today in clinic: My interpretation of the ECG today shows atrial paced at 75 bpmincomplete LBBB (QRS 116 msec) and PVC, QTc 480 msec    Lab Results   Component Value Date    WBC 5.3 11/28/2021    RBC 3.86 (L) 11/28/2021    HGB 10.3 (L) 11/28/2021    HCT 33.6 (L) 11/28/2021    MCV 87.0 11/28/2021    MCH 26.7 11/28/2021    MCHC 30.7 (L) 11/28/2021    PLT 271 11/28/2021     Lab Results   Component Value Date     CREAT 0.76 11/28/2021    BUN 13 11/28/2021    NA 142 11/28/2021    K 4.3 11/28/2021    CL 100 11/28/2021    BICARB 29 11/28/2021     Lab Results   Component Value Date    CHOL 217 10/30/2020    HDL 72 10/30/2020    LDLCALC 119 10/30/2020    TRIG 129 10/30/2020     [employer] ICD:  RA: 3.9mV, 439 ohms, 0.7V@0.4 msec  RV: 2.8 mV, 652 ohms, 0.5V@0.4 msec. R wave trend mostly 2-3 since 2 months after implant.  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  Device implant September 29, 2020. ICD shock 10/06/20 VF with clean termination.  On Metoprolol.   - All immunosuppression has stopped.  - Diagnosis of cardiac sarcoid presumed and not certain as [facility]. Report that an MRI was negative but dont see any reports. 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 She is followed by Dr Yung at [facility].  - Echo ordered  - Will refer to Paul Kim of our advanced heart failure team for further evaluation but may need MRI and PET   scan to see if active cardiac involement.     2. [employer] DDD ICD  - RV wave sensing is low. Has been 2-3 mV for past year. Chest Xray the RV ICD lead appears anterior. Threshold and impedence is ok. Will continue to follow. Will send her a remote monitor. Did discuss that if we see the R waves continue to lessen we may need extraction and reimplant. Also possibility that since she has been off immunosuppression for about a year could be worsening cardiac sarcoid.    Thank you for allowing me to participate in the care of this patient.    Kurt S. Hoffmayer,  M.D.

## 2021-12-02 NOTE — Telephone Encounter (Signed)
Routed message to [facility]

## 2021-12-02 NOTE — Telephone Encounter (Signed)
From: Fayne Kovalenko  To: Lorelee Cover, MD  Sent: 12/02/2021 1:41 PM PDT  Subject: Refill on my Oxycodone     Please Dr Keane Scrape   Is it possible for you to refill my prescription for Oxycodone 10/325 please , if you can do this please can you send the refill to [facility] on [address]   Thank you I truly appreciate it

## 2021-12-03 ENCOUNTER — Ambulatory Visit
Admission: RE | Admit: 2021-12-03 | Discharge: 2021-12-03 | Disposition: A | Discharge status: Home / Self Care | Payer: No Typology Code available for payment source | Attending: Clinical Cardiac Electrophysiology | Admitting: Clinical Cardiac Electrophysiology

## 2021-12-03 ENCOUNTER — Encounter (INDEPENDENT_AMBULATORY_CARE_PROVIDER_SITE_OTHER): Payer: Self-pay | Admitting: Internal Medicine

## 2021-12-03 DIAGNOSIS — I3481 Nonrheumatic mitral (valve) annulus calcification: Secondary | ICD-10-CM

## 2021-12-03 DIAGNOSIS — I5189 Other ill-defined heart diseases: Secondary | ICD-10-CM

## 2021-12-03 DIAGNOSIS — I358 Other nonrheumatic aortic valve disorders: Secondary | ICD-10-CM

## 2021-12-03 DIAGNOSIS — I342 Nonrheumatic mitral (valve) stenosis: Secondary | ICD-10-CM

## 2021-12-03 DIAGNOSIS — I361 Nonrheumatic tricuspid (valve) insufficiency: Secondary | ICD-10-CM

## 2021-12-03 DIAGNOSIS — D8685 Sarcoid myocarditis: Secondary | ICD-10-CM | POA: Insufficient documentation

## 2021-12-03 LAB — 2D ECHO WITH IMAGE ENHANCEMENT AGENT IF NECESSARY
IVC Diameter: 1.44 cm
LA Volume Index: 28.2 ml/m²
LV Ejection Fraction: 49 %
Mitral Mean Gradient: 2.9 mmHg
PA Pressure: 24 mmHg

## 2021-12-03 LAB — IMMUNOFIX INTERPRETATION, SERUM

## 2021-12-03 MED ORDER — PERFLUTREN LIPID MICROSPHERE 0.55 MG/10 ML IV SUSP (DILUTION)
0.0000 mL | Freq: Once | INTRAVENOUS | Status: AC
Start: 2021-12-03 — End: 2021-12-03
  Administered 2021-12-03: 3 mL via INTRAVENOUS
  Filled 2021-12-03: qty 10

## 2021-12-03 MED ORDER — PERFLUTREN LIPID MICROSPHERE 1.1 MG/ML IV SUSP
INTRAVENOUS | Status: AC
Start: 2021-12-03 — End: 2021-12-03
  Filled 2021-12-03: qty 2

## 2021-12-03 NOTE — Telephone Encounter (Signed)
Called and left a message to schedule pick up of repeat HST.    NO NEED for new order.

## 2021-12-04 ENCOUNTER — Encounter (HOSPITAL_COMMUNITY): Payer: Self-pay | Admitting: Clinical Cardiac Electrophysiology

## 2021-12-06 NOTE — Telephone Encounter (Signed)
Called patient to coordinate HST repeat, Left detailed message.      Attn CC:      If patient calls back please confirm date and time patient will like to come in. Mon-Thurs anytime after 2PM. Do not add patient to schedule.

## 2021-12-11 ENCOUNTER — Encounter (INDEPENDENT_AMBULATORY_CARE_PROVIDER_SITE_OTHER): Payer: Self-pay | Admitting: Internal Medicine

## 2021-12-11 DIAGNOSIS — E119 Type 2 diabetes mellitus without complications: Secondary | ICD-10-CM

## 2021-12-12 MED ORDER — INSULIN LISPRO (1 UNIT DIAL) 100 UNIT/ML SC SOPN
PEN_INJECTOR | SUBCUTANEOUS | 11 refills | Status: DC
Start: 2021-12-12 — End: 2022-10-08

## 2021-12-12 NOTE — Telephone Encounter (Signed)
S/w Pharmacy they thought RX was sent in today. RX was in system they will be getting it ready for the pt. Also tried to have them do a pharmacy to pharmacy transfer for the [facility] but they stated they were too busy, preferred if pcp can just resend RX to them. Will forward this request to Dr. Keane Scrape

## 2021-12-17 NOTE — Telephone Encounter (Signed)
Comprehensive Medication Review     Contacted patient via telephone to schedule a CMR with Eric Dehner, PharmD.     Was unable to reach patient and left voice mail on mobile phone number. Provided contact number for [facility] MTM program & requested patient to call back if they wish to schedule appointment.     Attempt: 3 (Final Call)     Outcomes MTM will be updated as 'Unable to reach patient'      Judy Bracamontes, CPhT  Pharmacy Technician III (MTM)  [facility]  Email: [email or web address]  Phone: [phone]

## 2021-12-20 ENCOUNTER — Other Ambulatory Visit (INDEPENDENT_AMBULATORY_CARE_PROVIDER_SITE_OTHER): Payer: Self-pay | Admitting: Family

## 2021-12-20 ENCOUNTER — Encounter (INDEPENDENT_AMBULATORY_CARE_PROVIDER_SITE_OTHER): Payer: Self-pay | Admitting: Internal Medicine

## 2021-12-20 ENCOUNTER — Encounter (INDEPENDENT_AMBULATORY_CARE_PROVIDER_SITE_OTHER): Payer: Self-pay

## 2021-12-20 ENCOUNTER — Ambulatory Visit (INDEPENDENT_AMBULATORY_CARE_PROVIDER_SITE_OTHER): Payer: No Typology Code available for payment source | Admitting: Internal Medicine

## 2021-12-20 VITALS — BP 101/67 | HR 64 | Temp 97.5°F | Resp 16 | Wt 221.0 lb

## 2021-12-20 DIAGNOSIS — M797 Fibromyalgia: Secondary | ICD-10-CM

## 2021-12-20 DIAGNOSIS — M25552 Pain in left hip: Secondary | ICD-10-CM

## 2021-12-20 DIAGNOSIS — M25511 Pain in right shoulder: Secondary | ICD-10-CM

## 2021-12-20 DIAGNOSIS — Z7189 Other specified counseling: Secondary | ICD-10-CM

## 2021-12-20 DIAGNOSIS — G8929 Other chronic pain: Secondary | ICD-10-CM

## 2021-12-20 DIAGNOSIS — R110 Nausea: Secondary | ICD-10-CM

## 2021-12-20 DIAGNOSIS — M25512 Pain in left shoulder: Secondary | ICD-10-CM

## 2021-12-20 DIAGNOSIS — E119 Type 2 diabetes mellitus without complications: Secondary | ICD-10-CM

## 2021-12-20 DIAGNOSIS — M25551 Pain in right hip: Secondary | ICD-10-CM

## 2021-12-20 DIAGNOSIS — Z794 Long term (current) use of insulin: Secondary | ICD-10-CM

## 2021-12-20 DIAGNOSIS — M25561 Pain in right knee: Secondary | ICD-10-CM

## 2021-12-20 DIAGNOSIS — M25562 Pain in left knee: Secondary | ICD-10-CM

## 2021-12-20 MED ORDER — ONDANSETRON 8 MG OR TBDP
ORAL_TABLET | ORAL | 0 refills | Status: DC
Start: 2021-12-20 — End: 2022-08-07

## 2021-12-20 MED ORDER — INSULIN GLARGINE SOLOSTAR 100 UNIT/ML SC SOPN
60.0000 [IU] | PEN_INJECTOR | Freq: Every evening | SUBCUTANEOUS | 3 refills | Status: DC
Start: 2021-12-20 — End: 2022-01-29

## 2021-12-20 NOTE — Progress Notes (Signed)
Subjective:  Kimberly Curtis is a 66 year old female  who was evaluated by Lorelee Cover, MD presenting with:   Chief Complaint   Patient presents with    Establish Care     Problem list was updated and reviewed today with patient.    HPI:     1) Diabetes Mellitus:  On Dexcom review she is having an average of BS 210 and HbA1c is 8.3.  She has has Lantus 39 at night, and Humalog as per sliding scale. We wil increase her Lantus to 60 units and Humalog as per sliding scale. She will see the Endocrinologist. She has been skipping meals, we urged her to be more compliant with her diet and medication. We will follow.     2) Hypertension  She has been having controlled HTN and had a 2-D echo that showed:   1. The left ventricular size is mildly increased. The left ventricular systolic function is borderline depressed. Left ventricular ejection fraction by Simpson's biplane is 49 %.   2. Mid anteroseptum is hypokinetic.   3. Mild or grade I (impaired relaxation pattern) left ventricular diastolic filling.   4. The right ventricular size is normal and systolic function is normal.   5. No previous study for comparison.  We will continue following up with the Cardiologist recommendations.     3) S/P Pacemaker:  She has been having some problems with the leads for the PM, She has been having some Chest pain. EKG was done and there was no significant changes compared to her baseline. In October she will go se an Electrophysiologist.     4) Anemia:  She has been having increasing fatigue and her HB was 10.3/Ht 33.6 done on 11/28/2021.   We will maintain the vigilance in this regard.   its 3 wk ago  (11/28/21) 1 yr ago  (11/21/20) 1 yr ago  (10/30/20) 1 yr ago  (07/09/20) 1 yr ago  (05/12/20)    WBC 4.0 - 10.0 1000/mm3 5.3 9.3 9.2 10.3 High  7.9   RBC 3.90 - 5.20 mill/mm3 3.86 Low  4.13 4.61 5.36 High  4.78   Hgb 11.2 - 15.7 gm/dL 10.3 Low  10.2 Low  11.4 12.7 12.4   Hct 34.0 - 45.0 % 33.6 Low  35.6 39.4 44.0 39.9   MCV 79.0  - 95.0 um3 87.0 86.2 85.5 82.1 83.5   MCH 26.0 - 32.0 pgm 26.7 24.7 Low  24.7 Low  23.7 Low  25.9 Low      5) Chronic Hip Pain/Shoulder pain:  This has been getting progressively worse, She has decreased ROM on both hips and both shoulders. She would like a referral to PT to increase her ROM slowly. We will follow.    6) Fibromyalgia/Chronic pain syndrome  She has seen the Rheumatologist this month due to increased pain in her hands and back. X-Rays were ordered at that time, stress reduction, increase sleep and multiple recommendations were given on that visit. We will be sending her to PT. We are handling the pain medications at this time. We will follow closely.     7) Sarcoidosis:  She has been diagnosed for the last 30 years in [place].  Patient is oxygen dependent and has been evaluated by Pulmonary 2 months ago. New evaluation in November this year. She had a sleep study, but needs to have a repeat done ASAP. 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  8) Mixed Hyperlipidemia:  We will repeat the Lipid profile ASAP to follow up her levels. In the meantime she is to continue the current dose of Lipitor (40 mg). We will follow. Urged to improve diet and physical activity as tolerated.     Patient Active Problem List    Diagnosis Date Noted    Chronic pain of both shoulders 08/28/2021    Bilateral hip pain 08/28/2021    Chronic cough 08/28/2021    Fibromyalgia 10/30/2020    Anxiety 10/12/2020    Allergy, initial encounter 10/12/2020    S/P placement of cardiac pacemaker 10/12/2020    Vitamin D deficiency 10/12/2020    Sarcoidosis 07/09/2020    Chronic pain syndrome 07/09/2020    Recurrent falls 07/09/2020    Chronic pain of both knees 07/09/2020    Gastroesophageal reflux disease, unspecified whether esophagitis present 07/09/2020    Nausea 07/09/2020    Essential hypertension 07/09/2020    Itching 07/09/2020    Mixed hyperlipidemia 07/09/2020    Type 2  diabetes mellitus with diabetic autonomic neuropathy, with long-term current use of insulin (CMS-HCC)        Medical/Family and Surgical History:    Past medical history and family history reviewed and updated today as below.      Social History     Socioeconomic History    Marital status: Married   Tobacco Use    Smoking status: Former    Smokeless tobacco: Never   Substance and Sexual Activity    Alcohol use: Not Currently    Drug use: Never    Past Surgical History:   Procedure Laterality Date    AICD pacemaker N/A 09/2018    CHOLECYSTECTOMY      HYSTERECTOMY      KNEE ARTHROSCOPY Left       Family History   Problem Relation Name Age of Onset    Diabetes Mother      Heart Disease Mother      Kidney Disease Mother      Hypertension Father      Alcohol/Drug Father        Past Medical History:   Diagnosis Date    Diabetes mellitus (CMS-HCC)     Hypertension     Sarcoidosis of lung (CMS-HCC)     bilat    Tachycardia 09/2018        Health Maintenance:  Health Maintenance Due   Topic Date Due    Breast Cancer Screen  Never done    COVID-19 Vaccine (1) Never done    Shingles Vaccine (1 of 2) Never done    Medicare Annual Wellness Visit  Never done    PHQ9 Depression Monitoring doc flowsheet  07/30/2021    Diabetic Foot Exam  09/18/2021    Diabetes A1c  09/30/2021    Influenza (1) Never done    LDL Monitoring  10/30/2021        Allergies and medications reviewed and updated as below.    Allergies   Allergen Reactions    Nsaids Nausea Only and Other     Per pt: Burning of stomach lining and nausea     Sulfa Drugs Anaphylaxis    Benadryl [Diphenhydramine] Rash and Itching     Per 12/25/20 TE: Do you have any additional known allergies to over the counter or prescription medications? Yes   If yes, please list additional medication(s) dye codeine on benadryl  and explain reaction(s)itching, rash     Latex Itching  Per 12/25/20 TE: Are you allergic to latex? Yes   If yes, please explain reaction(s)itchiness     Macrolides  Other     History of QT-C prolongation    Morphine Itching       Current Outpatient Medications   Medication Sig    albuterol (PROVENTIL) (2.5 MG/3ML) 0.083% nebulization 3 mL (2.5 mg) by Nebulization route every 4 hours as needed for Wheezing.    albuterol 108 (90 Base) MCG/ACT inhaler Inhale 2 puffs by mouth every 6 hours as needed for Wheezing.    Albuterol Sulfate 108 (90 Base) MCG/ACT AEPB Inhale 1 puff by mouth as needed.    ASPIRIN LOW DOSE 81 MG EC tablet TAKE 1 TABLET BY MOUTH EVERY DAY    atorvastatin (LIPITOR) 40 MG tablet Take 1 tablet (40 mg) by mouth daily.    continuous blood glucose (DEXCOM G7) receiver 1 Device by IntraDERMAL route daily. Use daily to read glucose topically.    controlled substance agreement controlled substance agreement    doxycycline (MONODOX) 100 MG capsule Take 1 capsule (100 mg) by mouth 2 times daily.    famotidine (PEPCID) 40 MG tablet Take 1 tablet (40 mg) by mouth daily.    fexofenadine (ALLEGRA) 180 MG tablet Take 1 tablet (180 mg) by mouth daily.    fluticasone-umeclidinium-vilanterol (TRELEGY ELLIPTA) 100-62.5-25 MCG/INH AEPB Inhale 1 puff by mouth daily.    folic acid (FOLVITE) 1 MG tablet TAKE 1 TABLET BY MOUTH EVERY DAY    guaiFENesin-codeine (ROBITUSSIN AC) 100-10 MG/5ML oral solution Take 5 mL by mouth every 6 hours as needed for Cough.    hydrOXYzine HCL (ATARAX) 50 MG tablet Take 1 tablet (50 mg) by mouth 2 times daily as needed for Itching. (Patient taking differently: Take 1 tablet (50 mg) by mouth 2 times daily as needed for Itching. hives)    Insulin Glargine Solostar (LANTUS SOLOSTAR) 100 UNIT/ML SOPN Inject 55 Units under the skin nightly.    insulin lispro, 1 Unit Dial, (HUMALOG KWIKPEN) 100 units/mL INJECT 20 UNITS UNDER THE SKIN 3 TIMES DAILY AS NEEDED FOR HIGH BLOOD SUGAR. IN THE MORNING 20 UNITS + SLIDING SCALE MID DAY: 17 + SLIDING SCALE AND IN THE EVENING 12 + SLIDING SCALE    Insulin Pen Needles (BD PEN NEEDLE NANO 2ND GEN) 32G X 4 MM MISC Inject 1  each under the skin 4 times daily. With each insulin administration    losartan (COZAAR) 50 MG tablet Take 1 tablet (50 mg) by mouth daily.    metoprolol tartrate (LOPRESSOR) 50 MG tablet Take 1 tablet (50 mg) by mouth 2 times daily.    mirtazapine (REMERON) 30 MG tablet TAKE 1 TABLET BY MOUTH NIGHTLY    montelukast (SINGULAIR) 10 MG tablet Take 1 tablet (10 mg) by mouth every evening.    mupirocin (BACTROBAN) 2 % ointment APPLY 1 APPLICATION TOPICALLY 2 TIMES DAILY. USE A SMALL AMOUNT AS DIRECTED    naloxone (NARCAN) 4 mg/0.1 mL nasal spray Spray 1 spray into one nostril once as needed.    ondansetron (ZOFRAN ODT) 8 MG disintegrating tablet TAKE 1 TABLET ON OR UNDER THE TONGUE EVERY 8 HOURS AS NEEDED FOR NAUSEA VOMITING.    oxycodone-acetaminophen (PERCOCET) 10-325 MG tablet Take 0.5 tablets by mouth every 6 hours as needed for Severe Pain (Pain Score 7-10).    pantoprazole (PROTONIX) 40 MG tablet Take 1 tablet (40 mg) by mouth every morning (before breakfast).    pregabalin (LYRICA) 75 MG capsule TAKE 1  CAPSULE BY MOUTH EVERY MORNING (BEFORE BREAKFAST) AND 2 CAPSULES EVERY EVENING FOR 30 DAYS. (Patient taking differently: '75mg'$  Morning and '150mg'$  nightly)     No current facility-administered medications for this visit.       ROS:   Review of Systems   Constitutional:  Positive for activity change (Decreased from before.), appetite change and fatigue. Negative for chills, fever and unexpected weight change.   HENT:  Positive for congestion, postnasal drip, sinus pressure and sinus pain. Negative for tinnitus and trouble swallowing.    Eyes: Negative.    Respiratory:  Positive for apnea, cough, chest tightness and shortness of breath. Negative for choking, wheezing and stridor.    Cardiovascular:  Positive for chest pain, palpitations and leg swelling.   Genitourinary: Negative.    Musculoskeletal:  Positive for arthralgias (Both hands and back), back pain and joint swelling.        Gait instability present all the  time.    Skin:  Positive for rash.   Allergic/Immunologic: Positive for environmental allergies.   Neurological:  Positive for dizziness, weakness, light-headedness and headaches. Negative for seizures, syncope and speech difficulty.   Psychiatric/Behavioral:  Positive for decreased concentration and sleep disturbance. Negative for behavioral problems, confusion, self-injury and suicidal ideas. The patient is not hyperactive.           Objective:     Physical Exam  Constitutional:       Appearance: She is obese. She is ill-appearing and diaphoretic.   HENT:      Head: Normocephalic and atraumatic.      Right Ear: Tympanic membrane and ear canal normal.      Left Ear: Tympanic membrane and ear canal normal.      Mouth/Throat:      Mouth: Mucous membranes are moist.      Pharynx: Oropharynx is clear. Posterior oropharyngeal erythema present. No oropharyngeal exudate.   Eyes:      Extraocular Movements: Extraocular movements intact.      Conjunctiva/sclera: Conjunctivae normal.      Pupils: Pupils are equal, round, and reactive to light.   Cardiovascular:      Rate and Rhythm: Normal rate and regular rhythm.      Pulses:           Dorsalis pedis pulses are 2+ on the right side and 2+ on the left side.      Heart sounds: Normal heart sounds.   Pulmonary:      Effort: Pulmonary effort is normal.      Breath sounds: Normal breath sounds.   Musculoskeletal:         General: Swelling and tenderness (both hands and Lower back.) present.      Cervical back: Normal range of motion and neck supple.      Right lower leg: Edema present.      Left lower leg: Edema present.      Right foot: Normal range of motion. No deformity, bunion or foot drop.      Left foot: Normal range of motion. No deformity, bunion or foot drop.   Feet:      Right foot:      Protective Sensation: 10 sites tested.  10 sites sensed.      Skin integrity: Skin integrity normal.      Left foot:      Protective Sensation: 10 sites tested.  10 sites sensed.       Skin integrity: Skin integrity normal.   Skin:  General: Skin is warm.      Findings: Rash present.   Neurological:      General: No focal deficit present.      Mental Status: She is alert and oriented to person, place, and time.   Psychiatric:         Mood and Affect: Mood normal.         Behavior: Behavior normal.        12/20/21  0926   BP: 101/67   Pulse: 64   Temp: 97.5 F (36.4 C)   Resp: 16   SpO2: 99%     Body mass index is 36.78 kg/m.           Labs and chart reviewed.    Lab Results   Component Value Date    A1C 7.2 (H) 04/02/2021    A1C 7.3 (H) 10/30/2020    A1C 8.4 (H) 07/09/2020     Lab Results   Component Value Date    CHOL 217 10/30/2020    HDL 72 10/30/2020    LDLCALC 119 10/30/2020    TRIG 129 10/30/2020     Lab Results   Component Value Date    TSH 1.56 11/28/2021       Assessment and Plan:     Kimberly Curtis was seen today for establish care.  Diagnoses and all orders for this visit:      1)  Type 2 diabetes mellitus without complication, with long-term current use of insulin (CMS-HCC)  On Dexcom review she is having an average of BS 210 and HbA1c is 8.3.  She has has Lantus 39 at night, and Humalog as per sliding scale. We wil increase her Lantus to 60 units and Humalog as per sliding scale. She will see the Endocrinologist. She has been skipping meals, we urged her to be more compliant with her diet and medication. We will follow.     2) Hypertension  She has been having controlled HTN and had a 2-D echo reviewed above, She has been having controlled numbers.  We will continue following up with the Cardiologist recommendations. Medications will remain at the same dose. We will follow.     3) S/P Pacemaker:  She has been having some problems with the leads for the PM, She has been having some Chest pain. EKG was done and there was no significant changes compared to her baseline. In October she will go se an Electrophysiologist.     4) Anemia:  She has been having increasing fatigue and her HB was  10.3/Ht 33.6 done on 11/28/2021.   We will maintain the vigilance in this regard.     5) Chronic Hip Pain/Shoulder pain:  This has been getting progressively worse, She has decreased ROM on both hips and both shoulders. She would like a referral to PT to increase her ROM slowly. We will follow.    6) Fibromyalgia/Chronic pain syndrome  She has seen the Rheumatologist this month due to increased pain in her hands and back. X-Rays were ordered at that time, stress reduction, increase sleep and multiple recommendations were given on that visit. We will be sending her to PT. We are handling the pain medications at this time. We will follow closely.     7) Sarcoidosis:  She has been diagnosed for the last 30 years in [place].  Patient is oxygen dependent and has been evaluated by Pulmonary 2 months ago. New evaluation in November this year. She had a sleep study,  but needs to have a repeat done ASAP. 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     8) Mixed Hyperlipidemia:  We will repeat the Lipid profile ASAP to follow up her levels. In the meantime she is to continue the current dose of Lipitor (40 mg). We will follow. Urged to improve diet and physical activity as tolerated.      Advance Care Planning   Advance Care Plan and/or Health Care Agent: Advance Directive discussed, patient unable to provide Advance Directive or [place] today because She wants to discuss with husband and family.           Referrals:    As above      Return to Clinic: 2 months.           I conducted a 40-minute face-to-face encounter with the patient during which we discussed observed changes during the visit and reviewed medical records and studies.     Barriers to learning assessed: None.  Patient/family verbalizes understanding and is agreeable to above plan.  Medications were reconciled during this visit, and complex decision making was performed. Discussed diagnoses and plans of  care. Discussed importance to take medications as directed, potential side effects, purpose and to immediately report to healthcare provider for any adverse effects.  Reviewed 911 or Emergency Department precautions, and when to seek further medical care for emergent conditions.  All questions and concerns answered to patient/family satisfaction.The plan was carefully reviewed verbally with the patient/family member/caregiver, and also affirmed that the patient/family member/caregiver understood next steps and follow up plan.    Reviewed verbally and AVS available via MyChart for patient.      Lorelee Cover, MD   Signature Derived From Controlled Access [place], December 20, 2021, 9:32 AM.  [place]

## 2021-12-20 NOTE — Telephone Encounter (Signed)
Rx signed.

## 2021-12-20 NOTE — Telephone Encounter (Signed)
Unable to reach patient for repeat. Closing encounter. Patient will need a new order and schedule for new HST.

## 2021-12-23 ENCOUNTER — Encounter (INDEPENDENT_AMBULATORY_CARE_PROVIDER_SITE_OTHER): Payer: Self-pay | Admitting: Hospital

## 2021-12-26 ENCOUNTER — Encounter (HOSPITAL_BASED_OUTPATIENT_CLINIC_OR_DEPARTMENT_OTHER): Payer: No Typology Code available for payment source | Admitting: Clinical Cardiac Electrophysiology

## 2021-12-26 DIAGNOSIS — Z95810 Presence of automatic (implantable) cardiac defibrillator: Secondary | ICD-10-CM

## 2021-12-26 DIAGNOSIS — Z4502 Encounter for adjustment and management of automatic implantable cardiac defibrillator: Secondary | ICD-10-CM

## 2022-01-01 ENCOUNTER — Encounter (HOSPITAL_COMMUNITY): Payer: Self-pay

## 2022-01-06 ENCOUNTER — Ambulatory Visit (INDEPENDENT_AMBULATORY_CARE_PROVIDER_SITE_OTHER): Payer: No Typology Code available for payment source | Admitting: Heart Failure and Transplant Cardiology

## 2022-01-06 ENCOUNTER — Encounter (INDEPENDENT_AMBULATORY_CARE_PROVIDER_SITE_OTHER): Payer: Self-pay | Admitting: Heart Failure and Transplant Cardiology

## 2022-01-06 VITALS — BP 160/76 | HR 77 | Temp 97.2°F | Resp 16 | Ht 65.0 in | Wt 224.5 lb

## 2022-01-06 DIAGNOSIS — Z95810 Presence of automatic (implantable) cardiac defibrillator: Secondary | ICD-10-CM

## 2022-01-06 DIAGNOSIS — D8685 Sarcoid myocarditis: Secondary | ICD-10-CM

## 2022-01-06 NOTE — Patient Instructions (Addendum)
Please plan to return to clinic in 2 months for further discussion and review cardiac MRI imaging that was done prior to ICD placement.     At the follow up visit with Dr. Maudie Mercury in 2 months, we will plan to consider obtaining a SPECT scan and possible PET scan in the future weighing the risks and benefits of potential exposure to radiation with the benefit of obtaining more diagnostic understanding of the heart.    As explained by Dr. Maudie Mercury, given that you have ICD hardware in your heart this makes the difficulty of obtaining a cardiac MRI not worth it currently given that the image will be filled with metal artifact thus making it significantly harder to interpret. Please remember to bring your previously done cardiac MRI done at [facility] and to drop it off at the front desk and Dr. Maudie Mercury will be happy to review the imaging.

## 2022-01-06 NOTE — Progress Notes (Signed)
[facility]      Primary Care Physician:  Lorelee Cover  Referring Provider:   Morene Antu       ID. 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    HPI:    Here with MS3 Kimberly Curtis    Patient was referred for sarcoidosis from Dr. Serita Butcher, MD for evaluation of possible cardiac involvement for her chronic sarcoidosis condition.    Patient was amiable this morning and presented to the clinic with her husband Kimberly Curtis.   She states that she has been having off and on again lung infections and was consequently stopped her prednisone and methotrexate for her sarcoidosis condition approximately one year prior iso of recurrent lung infections. She most recently had a lung infection this summer in July 2023 to which she was treated with a month long course of antibiotics.     Current Sx as of today:  Pt experiencing 1 yr worsening exertional dyspnea currently on 3L NC.  Pt endorses paroxysmal feeling of fluid in lungs; no pertussis appreciated on PEX but pt states increased mucus production and improvement after expectoration.  Pt has been experiencing waxing/waning LE edema bilaterally.  Pt has chronic orthopnea and uses 3 pillows when sleeping.  Pt has been experiencing worsening fatigue but thinks it is attached to her sarcoidosis.  Pt states an inability to use CPAP, stating that she feels like she can't breath.  Pt endorses sporadic chest pain that is managed chronically with her current pain regimen (not ass'd with exertion).       Allergies:  Patient is allergic to nsaids, sulfa drugs, benadryl [diphenhydramine], latex, macrolides, and morphine.    Current Meds:  Current Outpatient Medications   Medication Sig    albuterol (PROVENTIL) (2.5 MG/3ML) 0.083% nebulization 3 mL (2.5 mg) by Nebulization route every 4 hours as needed for Wheezing.    albuterol 108 (90 Base) MCG/ACT inhaler  Inhale 2 puffs by mouth every 6 hours as needed for Wheezing.    Albuterol Sulfate 108 (90 Base) MCG/ACT AEPB Inhale 1 puff by mouth as needed.    ASPIRIN LOW DOSE 81 MG EC tablet TAKE 1 TABLET BY MOUTH EVERY DAY    atorvastatin (LIPITOR) 40 MG tablet Take 1 tablet (40 mg) by mouth daily.    continuous blood glucose (DEXCOM G7) receiver 1 Device by IntraDERMAL route daily. Use daily to read glucose topically.    controlled substance agreement controlled substance agreement    doxycycline (MONODOX) 100 MG capsule Take 1 capsule (100 mg) by mouth 2 times daily.    famotidine (PEPCID) 40 MG tablet Take 1 tablet (40 mg) by mouth daily.    fexofenadine (ALLEGRA) 180 MG tablet Take 1 tablet (180 mg) by mouth daily.    fluticasone-umeclidinium-vilanterol (TRELEGY ELLIPTA) 100-62.5-25 MCG/INH AEPB Inhale 1 puff by mouth daily.    folic acid (FOLVITE) 1 MG tablet TAKE 1 TABLET BY MOUTH EVERY DAY    guaiFENesin-codeine (ROBITUSSIN AC) 100-10 MG/5ML oral solution Take 5 mL by mouth every 6 hours as needed for Cough.    hydrOXYzine HCL (ATARAX) 50 MG tablet Take 1 tablet (50 mg) by mouth 2 times daily as needed for Itching. (Patient taking differently: Take 1 tablet (50 mg) by mouth 2 times daily as needed for Itching. hives)    Insulin Glargine Solostar (LANTUS SOLOSTAR) 100 UNIT/ML SOPN Inject 60 Units under the skin nightly.  insulin lispro, 1 Unit Dial, (HUMALOG KWIKPEN) 100 units/mL INJECT 20 UNITS UNDER THE SKIN 3 TIMES DAILY AS NEEDED FOR HIGH BLOOD SUGAR. IN THE MORNING 20 UNITS + SLIDING SCALE MID DAY: 17 + SLIDING SCALE AND IN THE EVENING 12 + SLIDING SCALE    Insulin Pen Needles (BD PEN NEEDLE NANO 2ND GEN) 32G X 4 MM MISC Inject 1 each under the skin 4 times daily. With each insulin administration    losartan (COZAAR) 50 MG tablet Take 1 tablet (50 mg) by mouth daily.    metoprolol tartrate (LOPRESSOR) 50 MG tablet Take 1 tablet (50 mg) by mouth 2 times daily.    mirtazapine (REMERON) 30 MG tablet TAKE 1 TABLET BY  MOUTH NIGHTLY    montelukast (SINGULAIR) 10 MG tablet Take 1 tablet (10 mg) by mouth every evening.    mupirocin (BACTROBAN) 2 % ointment APPLY 1 APPLICATION TOPICALLY 2 TIMES DAILY. USE A SMALL AMOUNT AS DIRECTED    naloxone (NARCAN) 4 mg/0.1 mL nasal spray Spray 1 spray into one nostril once as needed.    ondansetron (ZOFRAN ODT) 8 MG disintegrating tablet TAKE 1 TABLET ON OR UNDER THE TONGUE EVERY 8 HOURS AS NEEDED FOR NAUSEA VOMITING.    oxycodone-acetaminophen (PERCOCET) 10-325 MG tablet Take 0.5 tablets by mouth every 6 hours as needed for Severe Pain (Pain Score 7-10).    pantoprazole (PROTONIX) 40 MG tablet Take 1 tablet (40 mg) by mouth every morning (before breakfast).    pregabalin (LYRICA) 75 MG capsule TAKE 1 CAPSULE BY MOUTH EVERY MORNING (BEFORE BREAKFAST) AND 2 CAPSULES EVERY EVENING FOR 30 DAYS. (Patient taking differently: 20m Morning and 1516mnightly)     No current facility-administered medications for this visit.       Past Medical History:     Sarcoidosis w/ Lung Involvement     s/p [place] ICD placement in 10/12/2020  Hx: Ventricular Fibrillation w/ last episode necessitating shock 10/06/20.     FamHx:  Patient's mother is deceased aged 689PMH s/f HTN, MI, T2DM  Patient's father is deceased at 7238PMH s/f Alzheimer's     Siblings:  Patient has 9 siblings total:  2 are deceased - one from major stroke / another from heart disease (had 3x CABG)  2 sisters have diabetes  1 sister currently has heart problems      Children:  Has 4 adult children all healthy      SocHx:  - No ETOH use other than at extremely rare celebratory events  - Smoking a pack a week in 1996  - No recreational drug history  - Transiently used marijuana gummies & CBD for pain after recommendation from doctor but did not find it to be helpful so did not continue use.  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 due to  disability  - Currently on disability and has been on since 2013    Past Medical History:   Diagnosis Date    Diabetes mellitus (CMS-HCC)     Hypertension     Sarcoidosis of lung (CMS-HCC)     bilat    Tachycardia 09/2018     Patient Active Problem List   Diagnosis    Type 2 diabetes mellitus with diabetic autonomic neuropathy, with long-term current use of insulin (CMS-HCC)    Sarcoidosis    Chronic  pain syndrome    Recurrent falls    Chronic pain of both knees    Gastroesophageal reflux disease, unspecified whether esophagitis present    Nausea    Essential hypertension    Itching    Mixed hyperlipidemia    Anxiety    Allergy, initial encounter    S/P placement of cardiac pacemaker    Vitamin D deficiency    Fibromyalgia    Chronic pain of both shoulders    Bilateral hip pain    Chronic cough     Social History     Socioeconomic History    Marital status: Married   Tobacco Use    Smoking status: Former    Smokeless tobacco: Never   Substance and Sexual Activity    Alcohol use: Not Currently    Drug use: Never     Family History   Problem Relation Name Age of Onset    Diabetes Mother      Heart Disease Mother      Kidney Disease Mother      Hypertension Father      Alcohol/Drug Father         Health Maintenance   Topic Date Due    Breast Cancer Screen  Never done    COVID-19 Vaccine (1) Never done    Shingles Vaccine (1 of 2) Never done    Medicare Annual Wellness Visit  Never done    PHQ9 Depression Monitoring doc flowsheet  07/30/2021    Diabetes A1c  09/30/2021    LDL Monitoring  10/30/2021    Pneumococcal Vaccine (1 - PCV) 01/12/2024 (Originally 09/20/1961)    Diabetic Retinal Exam  03/27/2022    Diabetes Urine Microalbumin  08/29/2022    Diabetes CMP  11/29/2022    Diabetic Foot Exam  12/21/2022    Colorectal Cancer Screening  04/15/2028    Tetanus (2 - Td or Tdap) 07/10/2030    Hepatitis C Screening  Completed    Universal HIV Screening  Completed    Polio Vaccine  Aged Out    IMM_Hep A Vaccine Series  Aged  Out    HPV Vaccine <= 43 Yrs  Aged Out    Meningococcal MCV4 Vaccine  Aged Out    Influenza  Discontinued         Physical Exam:  BP 160/76 (BP Location: Left arm, BP Patient Position: Sitting, BP cuff size: Large)   Pulse 77   Temp 97.2 F (36.2 C) (Temporal)   Resp 16   Ht 5' 5"  (1.651 m)   Wt 101.8 kg (224 lb 8 oz)   SpO2 100%   BMI 37.36 kg/m      GENERAL APPEARANCE: Well developed, well nourished, in no acute distress.   SKIN: No rashes, ulcerations or petechiae.   HEENT: Sclerae anicteric. Conjunctivae were pink and moist. Pupils equal, round.  Oropharynx clear without thrush.   NECK: No adenopathy, no thyromegaly. No distension noted.  CHEST: Symmetrical expansion with deep inspiration. No rales, rhonchi, or wheezes.  CARDIOVASCULAR: RRR. No murmurs. No S3. No S4. Distant heart sounds.  ABDOMEN: Soft and nontender with normal bowel sounds. No ascites was noted.   MUSCULOSKELETAL: Gait was normal.  Muscle strength and tone were normal.   EXTREMITIES: No cyanosis, clubbing. 1+ pitting edema. Peripheral pulses were 2+ and symmetric. No painful erythema nodosum in the lower extremities.  NEUROLOGIC: Alert and oriented x 3. Grossly intact.  MOOD: Calm, pleasant.       Lab Data:  WBC 5.3 (09/07) HGB 10.3* (09/07) PLT 271 (09/07)    HCT 33.6* (09/07)         Na 142 (09/07) CL 100 (09/07) BUN 13 (09/07) GLU   240* (09/07)   K 4.3 (09/07) CO2 29 (09/07) Cr 0.76 (09/07)           Lab Results   Component Value Date    COLORUA Yellow 04/16/2021    APPEARUA Clear 04/16/2021    GLUCOSEUA Negative 04/16/2021    BILIUA Negative 04/16/2021    KETONEUA Negative 04/16/2021    SGUA >=1.030 04/16/2021    BLOODUA Negative 04/16/2021    PHUA 5.5 04/16/2021    PROTEINUA Trace (A) 04/16/2021    UROBILUA 0.2 04/16/2021    NITRITEUA Negative 04/16/2021    LEUKESTUA Negative 04/16/2021    WBCUA 6-10 (A) 05/13/2020    RBCUA 3-5 (A) 05/13/2020       Lab Results   Component Value Date    WBC 5.3 11/28/2021    RBC 3.86 (L)  11/28/2021    HGB 10.3 (L) 11/28/2021    HCT 33.6 (L) 11/28/2021    MCV 87.0 11/28/2021    MCHC 30.7 (L) 11/28/2021    RDW 13.6 11/28/2021    PLT 271 11/28/2021    MPV 11.0 11/28/2021     Lab Results   Component Value Date    BUN 13 11/28/2021    CREAT 0.76 11/28/2021    CL 100 11/28/2021    NA 142 11/28/2021    K 4.3 11/28/2021    [place] 9.7 11/28/2021    TBILI 0.32 11/28/2021    ALB 4.1 11/28/2021    TP 7.1 11/28/2021    AST 24 11/28/2021    ALK 121 11/28/2021    BICARB 29 11/28/2021    ALT 18 11/28/2021    GLU 240 (H) 11/28/2021            The 10-year ASCVD risk score (Arnett DK, et al., 2019) is: 31.6%    Values used to calculate the score:      Age: 35 years      Sex: Female      Is Non-Hispanic African American: Yes      Diabetic: Yes      Tobacco smoker: No      Systolic Blood Pressure: 976 mmHg      Is BP treated: Yes      HDL Cholesterol: 72 mg/dL      Total Cholesterol: 217 mg/dL    Lipids:    Lab Results   Component Value Date    CHOL 217 10/30/2020    HDL 72 10/30/2020    LDLCALC 119 10/30/2020    TRIG 129 10/30/2020       Diabetes screen:  Lab Results   Component Value Date    A1C 7.2 (H) 04/02/2021     Thyroid:    Lab Results   Component Value Date    TSH 1.56 11/28/2021     12 lead ECG - a-sensed, v-paced, HR 77 bpm. PR interval 244 ms. QRS duration 150 ms. QTc 543 ms.     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 However, pt recently with dc of all immunosuppression due to recurrent pulm infections in 2022. She currently is on home O2 3L NC. Further diagnostic testing such  as PET would be helpful to see if there continues to be active cardiac inflammation by cardiac imaging. However, there is hesitance by pt and husband given radiation exposure. Repeat CMR would not provide disease inflammation activity. However, I would like to review previously obtained CMR  and coronary angiogram images from [facility] in 2022 and pt and husband will try and obtain these. At this time, close monitoring is appropriate - both clinically and by echo.    #. 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 On home O2 3LNC  #. 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 LVEF 50-55%. Invasive coronary angiogram neg 09/2020 per charting.  #. HTN  #. HLD  #. Morbid obesity  #. DM2, insulin dependent  #. OSA    - pt and husband will consider PET and/or SPECT imaging and we will discuss at the next clinic visit.  - pt and husband to request outside films for CMR and coronary angiogram Hervey Ard)  - no new med recommendations at this time.  - Drs. Hoffmayer and Cletis Media cc'd to this clinic note.      No orders of the defined types were placed in this encounter.    Patient Instructions   Please plan to return to clinic in 2 months for further discussion and review cardiac MRI imaging that was done prior to ICD placement.     At the follow up visit with Dr. Maudie Mercury in 2 months, we will plan to consider obtaining a SPECT scan and possible PET scan in the future weighing the risks and benefits of potential exposure to radiation with the benefit of obtaining more diagnostic understanding of the heart.    As explained by Dr. Maudie Mercury, given that you have ICD hardware in your heart this makes the difficulty of obtaining a cardiac MRI not worth it currently given that the image will be filled with metal artifact thus making it significantly harder to interpret. Please remember to bring your previously done cardiac MRI done at [facility] and to drop it off at the front desk and Dr. Maudie Mercury will be happy to review the imaging.    Return in about 2 months (around 03/08/2022).    Future Appointments   Date Time Provider [place]   01/29/2022 10:30 AM [facility] PFT ROOM [facility] Lab Exec. [id number]   01/29/2022 11:00 AM Eda Keys, MD [facility] Pulm Sle Exec. [phone]   01/29/2022  1:00 PM Lorelee Cover, MD EAS [facility]   02/07/2022 10:00 AM Derwood Kaplan, MD [facility]   02/24/2022  9:40 AM Lora Paula, MD [facility] CARDV[facility] [place]   06/09/2022 12:40 PM Hoffmayer, Emmaline Life, MD [facility] CARDVASC [facility]       Problem List Items Addressed This Visit    None  Visit Diagnoses       Cardiac sarcoidosis    -  Primary    ICD (implantable cardioverter-defibrillator) in place                  I spent 90 minutes face to face with the patient. Greater than 50% of the visit was spent discussing their heart condition, details as above in assessment and plan, all questions answered.    Cristal Ford, MD, MAS  Assistant Clinical Professor  Advanced HF, MCS, Heart-Transplant  Cardiac MRI and CT  Departments of Medicine and Radiology    Office #: [phone]  E-mail: [email or web address]  Pager: [phone], [phone]

## 2022-01-06 NOTE — Progress Notes (Deleted)
[employer] [place] [facility]  Advanced Heart Failure Clinic      Primary Care Physician:  Lorelee Cover  Referring Provider:        ID. 66 year old female here for follow up ***.    HPI:      Allergies:  Patient is allergic to nsaids, sulfa drugs, benadryl [diphenhydramine], latex, macrolides, and morphine.    Current Meds:  Current Outpatient Medications   Medication Sig    albuterol (PROVENTIL) (2.5 MG/3ML) 0.083% nebulization 3 mL (2.5 mg) by Nebulization route every 4 hours as needed for Wheezing.    albuterol 108 (90 Base) MCG/ACT inhaler Inhale 2 puffs by mouth every 6 hours as needed for Wheezing.    Albuterol Sulfate 108 (90 Base) MCG/ACT AEPB Inhale 1 puff by mouth as needed.    ASPIRIN LOW DOSE 81 MG EC tablet TAKE 1 TABLET BY MOUTH EVERY DAY    atorvastatin (LIPITOR) 40 MG tablet Take 1 tablet (40 mg) by mouth daily.    continuous blood glucose (DEXCOM G7) receiver 1 Device by IntraDERMAL route daily. Use daily to read glucose topically.    controlled substance agreement controlled substance agreement    doxycycline (MONODOX) 100 MG capsule Take 1 capsule (100 mg) by mouth 2 times daily.    famotidine (PEPCID) 40 MG tablet Take 1 tablet (40 mg) by mouth daily.    fexofenadine (ALLEGRA) 180 MG tablet Take 1 tablet (180 mg) by mouth daily.    fluticasone-umeclidinium-vilanterol (TRELEGY ELLIPTA) 100-62.5-25 MCG/INH AEPB Inhale 1 puff by mouth daily.    folic acid (FOLVITE) 1 MG tablet TAKE 1 TABLET BY MOUTH EVERY DAY    guaiFENesin-codeine (ROBITUSSIN AC) 100-10 MG/5ML oral solution Take 5 mL by mouth every 6 hours as needed for Cough.    hydrOXYzine HCL (ATARAX) 50 MG tablet Take 1 tablet (50 mg) by mouth 2 times daily as needed for Itching. (Patient taking differently: Take 1 tablet (50 mg) by mouth 2 times daily as needed for Itching. hives)    Insulin Glargine Solostar (LANTUS SOLOSTAR) 100 UNIT/ML SOPN Inject 60 Units under the skin nightly.     insulin lispro, 1 Unit Dial, (HUMALOG KWIKPEN) 100 units/mL INJECT 20 UNITS UNDER THE SKIN 3 TIMES DAILY AS NEEDED FOR HIGH BLOOD SUGAR. IN THE MORNING 20 UNITS + SLIDING SCALE MID DAY: 17 + SLIDING SCALE AND IN THE EVENING 12 + SLIDING SCALE    Insulin Pen Needles (BD PEN NEEDLE NANO 2ND GEN) 32G X 4 MM MISC Inject 1 each under the skin 4 times daily. With each insulin administration    losartan (COZAAR) 50 MG tablet Take 1 tablet (50 mg) by mouth daily.    metoprolol tartrate (LOPRESSOR) 50 MG tablet Take 1 tablet (50 mg) by mouth 2 times daily.    mirtazapine (REMERON) 30 MG tablet TAKE 1 TABLET BY MOUTH NIGHTLY    montelukast (SINGULAIR) 10 MG tablet Take 1 tablet (10 mg) by mouth every evening.    mupirocin (BACTROBAN) 2 % ointment APPLY 1 APPLICATION TOPICALLY 2 TIMES DAILY. USE A SMALL AMOUNT AS DIRECTED    naloxone (NARCAN) 4 mg/0.1 mL nasal spray Spray 1 spray into one nostril once as needed.    ondansetron (ZOFRAN ODT) 8 MG disintegrating tablet TAKE 1 TABLET ON OR UNDER THE TONGUE EVERY 8 HOURS AS NEEDED FOR NAUSEA VOMITING.    oxycodone-acetaminophen (PERCOCET) 10-325 MG tablet Take 0.5 tablets by mouth every 6 hours as needed for  Severe Pain (Pain Score 7-10).    pantoprazole (PROTONIX) 40 MG tablet Take 1 tablet (40 mg) by mouth every morning (before breakfast).    pregabalin (LYRICA) 75 MG capsule TAKE 1 CAPSULE BY MOUTH EVERY MORNING (BEFORE BREAKFAST) AND 2 CAPSULES EVERY EVENING FOR 30 DAYS. (Patient taking differently: 48m Morning and 1562mnightly)     No current facility-administered medications for this visit.       Past Medical History:  ***    FamHx:  ***    SocHx:  ***    Past Medical History:   Diagnosis Date    Diabetes mellitus (CMS-HCC)     Hypertension     Sarcoidosis of lung (CMS-HCC)     bilat    Tachycardia 09/2018     Patient Active Problem List   Diagnosis    Type 2 diabetes mellitus with diabetic autonomic neuropathy, with long-term current use of insulin (CMS-HCC)    Sarcoidosis     Chronic pain syndrome    Recurrent falls    Chronic pain of both knees    Gastroesophageal reflux disease, unspecified whether esophagitis present    Nausea    Essential hypertension    Itching    Mixed hyperlipidemia    Anxiety    Allergy, initial encounter    S/P placement of cardiac pacemaker    Vitamin D deficiency    Fibromyalgia    Chronic pain of both shoulders    Bilateral hip pain    Chronic cough     Social History     Socioeconomic History    Marital status: Married   Tobacco Use    Smoking status: Former    Smokeless tobacco: Never   Substance and Sexual Activity    Alcohol use: Not Currently    Drug use: Never     Family History   Problem Relation Name Age of Onset    Diabetes Mother      Heart Disease Mother      Kidney Disease Mother      Hypertension Father      Alcohol/Drug Father         Health Maintenance   Topic Date Due    Breast Cancer Screen  Never done    COVID-19 Vaccine (1) Never done    Shingles Vaccine (1 of 2) Never done    Medicare Annual Wellness Visit  Never done    PHQ9 Depression Monitoring doc flowsheet  07/30/2021    Diabetes A1c  09/30/2021    LDL Monitoring  10/30/2021    Pneumococcal Vaccine (1 - PCV) 01/12/2024 (Originally 09/20/1961)    Diabetic Retinal Exam  03/27/2022    Diabetes Urine Microalbumin  08/29/2022    Diabetes CMP  11/29/2022    Diabetic Foot Exam  12/21/2022    Colorectal Cancer Screening  04/15/2028    Tetanus (2 - Td or Tdap) 07/10/2030    Hepatitis C Screening  Completed    Universal HIV Screening  Completed    Polio Vaccine  Aged Out    IMM_Hep A Vaccine Series  Aged Out    HPV Vaccine <= 2654rs  Aged Out    Meningococcal MCV4 Vaccine  Aged Out    Influenza  Discontinued         Physical Exam:  There were no vitals taken for this visit.     GENERAL APPEARANCE: Well developed, well nourished, in no acute distress.   SKIN: No rashes, ulcerations or petechiae.   HEENT:  Sclerae anicteric. Conjunctivae were pink and moist. Pupils equal, round.  Oropharynx  clear without thrush.   NECK: No adenopathy, no thyromegaly. Jugular venous pressure ***cm, *** hepatojugular reflux.    CHEST: Symmetrical expansion with deep inspiration. No rales, rhonchi, or wheezes.  CARDIOVASCULAR: RRR. No murmurs. No S3. No S4.   ABDOMEN: Soft and nontender with normal bowel sounds. No ascites was noted.   MUSCULOSKELETAL: Gait was normal.  Muscle strength and tone were normal.   EXTREMITIES: No cyanosis, clubbing. ***edema. Peripheral pulses were 2+ and symmetric.  NEUROLOGIC: Alert and oriented x 3. Grossly intact.  MOOD: Calm, pleasant.    Prior Cardiovascular Studies:   ***    Lab Data:  WBC 5.3 (09/07) HGB 10.3* (09/07) PLT 271 (09/07)    HCT 33.6* (09/07)         Na 142 (09/07) CL 100 (09/07) BUN 13 (09/07) GLU   240* (09/07)   K 4.3 (09/07) CO2 29 (09/07) Cr 0.76 (09/07)           Lab Results   Component Value Date    COLORUA Yellow 04/16/2021    APPEARUA Clear 04/16/2021    GLUCOSEUA Negative 04/16/2021    BILIUA Negative 04/16/2021    KETONEUA Negative 04/16/2021    SGUA >=1.030 04/16/2021    BLOODUA Negative 04/16/2021    PHUA 5.5 04/16/2021    PROTEINUA Trace (A) 04/16/2021    UROBILUA 0.2 04/16/2021    NITRITEUA Negative 04/16/2021    LEUKESTUA Negative 04/16/2021    WBCUA 6-10 (A) 05/13/2020    RBCUA 3-5 (A) 05/13/2020       Lab Results   Component Value Date    WBC 5.3 11/28/2021    RBC 3.86 (L) 11/28/2021    HGB 10.3 (L) 11/28/2021    HCT 33.6 (L) 11/28/2021    MCV 87.0 11/28/2021    MCHC 30.7 (L) 11/28/2021    RDW 13.6 11/28/2021    PLT 271 11/28/2021    MPV 11.0 11/28/2021     Lab Results   Component Value Date    BUN 13 11/28/2021    CREAT 0.76 11/28/2021    CL 100 11/28/2021    NA 142 11/28/2021    K 4.3 11/28/2021    [place] 9.7 11/28/2021    TBILI 0.32 11/28/2021    ALB 4.1 11/28/2021    TP 7.1 11/28/2021    AST 24 11/28/2021    ALK 121 11/28/2021    BICARB 29 11/28/2021    ALT 18 11/28/2021    GLU 240 (H) 11/28/2021            The 10-year ASCVD risk score (Arnett DK, et al.,  2019) is: 11.1%    Values used to calculate the score:      Age: 55 years      Sex: Female      Is Non-Hispanic African American: Yes      Diabetic: Yes      Tobacco smoker: No      Systolic Blood Pressure: 299 mmHg      Is BP treated: Yes      HDL Cholesterol: 72 mg/dL      Total Cholesterol: 217 mg/dL    Lipids:    Lab Results   Component Value Date    CHOL 217 10/30/2020    HDL 72 10/30/2020    LDLCALC 119 10/30/2020    TRIG 129 10/30/2020       Diabetes screen:  Lab Results  Component Value Date    A1C 7.2 (H) 04/02/2021     Thyroid:    Lab Results   Component Value Date    TSH 1.56 11/28/2021     12 lead ECG - a-sensed, v-paced, HR 77 bpm. PR interval 244 ms. QRS duration 150 ms. QTc 543 ms.    Assessment Summary:  ***    #. ***      No orders of the defined types were placed in this encounter.    There are no Patient Instructions on file for this visit.    No follow-ups on file.    Future Appointments   Date Time Provider [place]   01/06/2022  8:00 AM Lora Paula, MD [facility] CARDVASC [place]   01/13/2022 10:20 AM Tonia Brooms, NP EAS [facility] [place]   01/29/2022 10:30 AM [facility] PFT ROOM [facility] Lab Exec. [id number]   01/29/2022 11:00 AM Eda Keys, MD [facility]le [facility]. [phone]   01/29/2022  1:00 PM Lorelee Cover, MD EAS [facility] [place]   02/07/2022 10:00 AM Derwood Kaplan, MD [facility] [place] [facility]   06/09/2022 12:40 PM Hoffmayer, Emmaline Life, MD SCV CARDVASC SCV       Problem List Items Addressed This Visit    None        I spent *** minutes face to face with the patient. Greater than 50% of the visit was spent discussing their heart condition, details as above in assessment and plan, all questions answered.    Cristal Ford, MD, MAS  Assistant Clinical Professor  Advanced HF, MCS, Heart-Transplant  Cardiac MRI and CT  Departments of Medicine and Radiology    Office #: [phone]  E-mail: [email or web address]  Pager: [phone], [phone]

## 2022-01-07 LAB — ECG 12-LEAD
ATRIAL RATE: 77 {beats}/min
ECG INTERPRETATION: DETECTED
P AXIS: 80 degrees
PR INTERVAL: 244 ms
QRS INTERVAL/DURATION: 150 ms
QT: 480 ms
QTc (Bazett): 543 ms
R AXIS: -73 degrees
T AXIS: 85 degrees
VENTRICULAR RATE: 77 {beats}/min

## 2022-01-09 ENCOUNTER — Encounter (INDEPENDENT_AMBULATORY_CARE_PROVIDER_SITE_OTHER): Payer: Self-pay | Admitting: Internal Medicine

## 2022-01-09 DIAGNOSIS — M797 Fibromyalgia: Secondary | ICD-10-CM

## 2022-01-09 DIAGNOSIS — G894 Chronic pain syndrome: Secondary | ICD-10-CM

## 2022-01-09 NOTE — Telephone Encounter (Signed)
Established with:  Last OV with PCP:  Next OV with Dept:  Future Appointments: Kimberly Curtis   12/20/2021  01/29/2022  Future Appointments   Date Time Provider [place]   01/29/2022 10:30 AM [facility] PFT ROOM [facility] Lab Exec. [id number]   01/29/2022 11:00 AM Eda Keys, MD [facility] [phone]   01/29/2022  1:00 PM Kimberly Cover, MD [facility] [place]   02/07/2022 10:00 AM Derwood Kaplan, MD [facility]   02/24/2022  9:40 AM Lora Paula, MD [facility] CARDVASC [place]   06/09/2022 12:40 PM Hoffmayer, Emmaline Life, MD [facility] CARDVASC [facility]        Medication requested:   Requested Prescriptions     Pending Prescriptions Disp Refills    oxycodone-acetaminophen (PERCOCET) 10-325 MG tablet 30 tablet 0     Sig: Take 0.5 tablets by mouth every 6 hours as needed for Severe Pain (Pain Score 7-10).       Last Filled Date 12/02/21   Quantity Last Filled  30   Refill 0   Send to:    [facility] [place] [address]  [place] [address]  Phone: [phone] Fax: [phone]    [id number] [place], [place] [id number] Phone: [phone]: [phone]     Current Medication(s):  Current Outpatient Medications   Medication Sig Dispense Refill    albuterol (PROVENTIL) (2.5 MG/3ML) 0.083% nebulization 3 mL (2.5 mg) by Nebulization route every 4 hours as needed for Wheezing. 1 each 2    albuterol 108 (90 Base) MCG/ACT inhaler Inhale 2 puffs by mouth every 6 hours as needed for Wheezing. 1 each 0    Albuterol Sulfate 108 (90 Base) MCG/ACT AEPB Inhale 1 puff by mouth as needed.      ASPIRIN LOW DOSE 81 MG EC tablet TAKE 1 TABLET BY MOUTH EVERY DAY 120 tablet 1    atorvastatin (LIPITOR) 40 MG tablet Take 1 tablet (40 mg) by mouth daily. 90 tablet 1    continuous blood glucose (DEXCOM G7) receiver 1 Device by IntraDERMAL route daily. Use daily to read glucose topically. 1 each 2    controlled substance agreement  controlled substance agreement 1 each 0    doxycycline (MONODOX) 100 MG capsule Take 1 capsule (100 mg) by mouth 2 times daily. 60 capsule 1    famotidine (PEPCID) 40 MG tablet Take 1 tablet (40 mg) by mouth daily. 90 tablet 1    fexofenadine (ALLEGRA) 180 MG tablet Take 1 tablet (180 mg) by mouth daily. 90 tablet 1    fluticasone-umeclidinium-vilanterol (TRELEGY ELLIPTA) 100-62.5-25 MCG/INH AEPB Inhale 1 puff by mouth daily.      folic acid (FOLVITE) 1 MG tablet TAKE 1 TABLET BY MOUTH EVERY DAY 90 tablet 1    guaiFENesin-codeine (ROBITUSSIN AC) 100-10 MG/5ML oral solution Take 5 mL by mouth every 6 hours as needed for Cough. 120 mL 0    hydrOXYzine HCL (ATARAX) 50 MG tablet Take 1 tablet (50 mg) by mouth 2 times daily as needed for Itching. (Patient taking differently: Take 1 tablet (50 mg) by mouth 2 times daily as needed for Itching. hives) 180 tablet 3    Insulin Glargine Solostar (LANTUS SOLOSTAR) 100 UNIT/ML SOPN Inject 60 Units under the skin nightly. 15 mL 3    insulin lispro, 1 Unit Dial, (HUMALOG KWIKPEN) 100 units/mL  INJECT 20 UNITS UNDER THE SKIN 3 TIMES DAILY AS NEEDED FOR HIGH BLOOD SUGAR. IN THE MORNING 20 UNITS + SLIDING SCALE MID DAY: 17 + SLIDING SCALE AND IN THE EVENING 12 + SLIDING SCALE 15 mL 11    Insulin Pen Needles (BD PEN NEEDLE NANO 2ND GEN) 32G X 4 MM MISC Inject 1 each under the skin 4 times daily. With each insulin administration 400 each 3    losartan (COZAAR) 50 MG tablet Take 1 tablet (50 mg) by mouth daily. 90 tablet 0    metoprolol tartrate (LOPRESSOR) 50 MG tablet Take 1 tablet (50 mg) by mouth 2 times daily. 180 tablet 3    mirtazapine (REMERON) 30 MG tablet TAKE 1 TABLET BY MOUTH NIGHTLY 90 tablet 1    montelukast (SINGULAIR) 10 MG tablet Take 1 tablet (10 mg) by mouth every evening. 90 tablet 3    mupirocin (BACTROBAN) 2 % ointment APPLY 1 APPLICATION TOPICALLY 2 TIMES DAILY. USE A SMALL AMOUNT AS DIRECTED 22 g 1    naloxone (NARCAN) 4 mg/0.1 mL nasal spray Spray 1 spray into  one nostril once as needed.      ondansetron (ZOFRAN ODT) 8 MG disintegrating tablet TAKE 1 TABLET ON OR UNDER THE TONGUE EVERY 8 HOURS AS NEEDED FOR NAUSEA VOMITING. 30 tablet 0    oxycodone-acetaminophen (PERCOCET) 10-325 MG tablet Take 0.5 tablets by mouth every 6 hours as needed for Severe Pain (Pain Score 7-10). 30 tablet 0    pantoprazole (PROTONIX) 40 MG tablet Take 1 tablet (40 mg) by mouth every morning (before breakfast). 90 tablet 3    pregabalin (LYRICA) 75 MG capsule TAKE 1 CAPSULE BY MOUTH EVERY MORNING (BEFORE BREAKFAST) AND 2 CAPSULES EVERY EVENING FOR 30 DAYS. (Patient taking differently: '75mg'$  Morning and '150mg'$  nightly) 90 capsule 0     No current facility-administered medications for this visit.       Patient information: Allergies   Allergen Reactions    Nsaids Nausea Only and Other     Per pt: Burning of stomach lining and nausea     Sulfa Drugs Anaphylaxis    Benadryl [Diphenhydramine] Rash and Itching     Per 12/25/20 TE: Do you have any additional known allergies to over the counter or prescription medications? Yes   If yes, please list additional medication(s) dye codeine on benadryl  and explain reaction(s)itching, rash     Latex Itching     Per 12/25/20 TE: Are you allergic to latex? Yes   If yes, please explain reaction(s)itchiness     Macrolides Other     History of QT-C prolongation    Morphine Itching        Health Maintenance Due   Topic Date Due    Breast Cancer Screen  Never done    COVID-19 Vaccine (1) Never done    Shingles Vaccine (1 of 2) Never done    Medicare Annual Wellness Visit  Never done    PHQ9 Depression Monitoring doc flowsheet  07/30/2021    Diabetes A1c  09/30/2021    LDL Monitoring  10/30/2021      Last labs:  Lab Results   Component Value Date    CHOL 217 10/30/2020    HDL 72 10/30/2020    LDLCALC 119 10/30/2020    TRIG 129 10/30/2020    TSH 1.56 11/28/2021    A1C 7.2 (H) 04/02/2021      Blood Pressure   01/06/22 160/76   12/20/21 101/67   12/02/21  153/81    No  components found for: 76M

## 2022-01-09 NOTE — Telephone Encounter (Signed)
From: Christe Sondgeroth  To: Lorelee Cover, MD  Sent: 01/09/2022 12:02 PM PDT  Subject: Lars Pinks     I writing because I'm completely out of my Lantus and only have a couple of my Oxycodone left can you please send in a prescription for my Lantus and a refill for my pain medication please at [facility] on [address] please the Lantus will be new at [facility] and the Pain medication is just a refill Thank you so much I truly appreciate it

## 2022-01-10 ENCOUNTER — Ambulatory Visit (INDEPENDENT_AMBULATORY_CARE_PROVIDER_SITE_OTHER): Payer: No Typology Code available for payment source | Admitting: Family

## 2022-01-10 MED ORDER — OXYCODONE-ACETAMINOPHEN 10-325 MG OR TABS
0.5000 | ORAL_TABLET | Freq: Four times a day (QID) | ORAL | 0 refills | Status: DC | PRN
Start: 2022-01-10 — End: 2022-02-25

## 2022-01-13 ENCOUNTER — Ambulatory Visit (INDEPENDENT_AMBULATORY_CARE_PROVIDER_SITE_OTHER): Payer: No Typology Code available for payment source | Admitting: Family

## 2022-01-17 ENCOUNTER — Encounter (INDEPENDENT_AMBULATORY_CARE_PROVIDER_SITE_OTHER): Payer: Self-pay | Admitting: Internal Medicine

## 2022-01-17 DIAGNOSIS — G894 Chronic pain syndrome: Secondary | ICD-10-CM

## 2022-01-20 NOTE — Telephone Encounter (Signed)
From: Addisynn Baka  To: Lorelee Cover, MD  Sent: 01/17/2022 4:10 PM PDT  Subject: To [name]     Can you please have Dr. Keane Scrape please call in my prescription of Pregabalin 75 mg at [facility] please I really appreciate it thanks

## 2022-01-22 MED ORDER — PREGABALIN 75 MG OR CAPS
ORAL_CAPSULE | ORAL | 0 refills | Status: DC
Start: 2022-01-22 — End: 2022-04-22

## 2022-01-29 ENCOUNTER — Encounter (INDEPENDENT_AMBULATORY_CARE_PROVIDER_SITE_OTHER): Payer: Self-pay | Admitting: Pulmonary Medicine

## 2022-01-29 ENCOUNTER — Inpatient Hospital Stay (INDEPENDENT_AMBULATORY_CARE_PROVIDER_SITE_OTHER)
Admit: 2022-01-29 | Discharge: 2022-01-29 | Disposition: A | Discharge status: Home Health | Payer: No Typology Code available for payment source

## 2022-01-29 ENCOUNTER — Encounter (INDEPENDENT_AMBULATORY_CARE_PROVIDER_SITE_OTHER): Payer: Self-pay | Admitting: Internal Medicine

## 2022-01-29 ENCOUNTER — Encounter (INDEPENDENT_AMBULATORY_CARE_PROVIDER_SITE_OTHER): Payer: Self-pay

## 2022-01-29 ENCOUNTER — Ambulatory Visit (INDEPENDENT_AMBULATORY_CARE_PROVIDER_SITE_OTHER): Payer: No Typology Code available for payment source | Admitting: Internal Medicine

## 2022-01-29 ENCOUNTER — Other Ambulatory Visit: Payer: No Typology Code available for payment source | Attending: Pulmonary Medicine | Admitting: Pulmonary Medicine

## 2022-01-29 VITALS — BP 143/84 | HR 80 | Resp 20 | Ht 65.0 in | Wt 223.5 lb

## 2022-01-29 VITALS — BP 103/70 | HR 77 | Temp 98.2°F | Resp 18 | Wt 223.0 lb

## 2022-01-29 DIAGNOSIS — R059 Cough, unspecified: Secondary | ICD-10-CM

## 2022-01-29 DIAGNOSIS — D500 Iron deficiency anemia secondary to blood loss (chronic): Secondary | ICD-10-CM

## 2022-01-29 DIAGNOSIS — J984 Other disorders of lung: Secondary | ICD-10-CM

## 2022-01-29 DIAGNOSIS — I119 Hypertensive heart disease without heart failure: Secondary | ICD-10-CM

## 2022-01-29 DIAGNOSIS — G894 Chronic pain syndrome: Secondary | ICD-10-CM

## 2022-01-29 DIAGNOSIS — E1165 Type 2 diabetes mellitus with hyperglycemia: Secondary | ICD-10-CM

## 2022-01-29 DIAGNOSIS — Z794 Long term (current) use of insulin: Secondary | ICD-10-CM

## 2022-01-29 DIAGNOSIS — D869 Sarcoidosis, unspecified: Secondary | ICD-10-CM

## 2022-01-29 DIAGNOSIS — E1169 Type 2 diabetes mellitus with other specified complication: Secondary | ICD-10-CM

## 2022-01-29 DIAGNOSIS — J9611 Chronic respiratory failure with hypoxia: Secondary | ICD-10-CM

## 2022-01-29 DIAGNOSIS — R600 Localized edema: Secondary | ICD-10-CM

## 2022-01-29 DIAGNOSIS — E119 Type 2 diabetes mellitus without complications: Secondary | ICD-10-CM

## 2022-01-29 MED ORDER — HYDROCHLOROTHIAZIDE 25 MG OR TABS
25.0000 mg | ORAL_TABLET | Freq: Every day | ORAL | 3 refills | Status: DC
Start: 2022-01-29 — End: 2022-10-08

## 2022-01-29 MED ORDER — INSULIN GLARGINE SOLOSTAR 100 UNIT/ML SC SOPN
80.0000 [IU] | PEN_INJECTOR | Freq: Every evening | SUBCUTANEOUS | 3 refills | Status: DC
Start: 2022-01-29 — End: 2022-05-13

## 2022-01-29 NOTE — Progress Notes (Signed)
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    Other PMHx significant for HTN, elevated BMI, and T2DM/peripheral neuropathy    Patient was first diagnosed to have sarcoidosis ~1990's when she presented with GI upset and abnormal CT scan with ?interstital fibrosis. She underwent a needle biopsy "through my back" which showed diagnosis of sarcoidosis.     For the past 10 years, patient has being having frequent chest infections from 1-2 times a year to eventually 3-4 times a year. However, in 2020, patient was placed on methotrexate in order tot ry to help patient but "it wasn't doing any good" so it was stopped in Jan 2022.  Co-incidentally, in the past 2 years, she has ~6 times of chest infection/year. In 2022 so far, she has already been hospitalized 4 x, each time she responded to antibiotics.     In January 2022, her doctor in [place] placed ?bronchial/tracheal stent in patient for ?Chauncey Fischer but had to be removed after 1 month due to local irritation.     Patient also has sinus symptoms with endoscopic evidence of "yeast" and was given Diflucan for 7 days.    Twi months ago, she moved to [facility] and started her care at [facility].    Patient has been on prednisone since 2020, with baseline dose of 10 mg. Was on methotrexate for 2 years until Janaury 2022.    Patient was admitted to [facility] for pneumonia 5/17-27/2022 while visiting daughter in [place], including short stay in ICU  with [place], and completed antibiotics (Ceftin 5/31):  repeat sputum Aug 02, 2021: MSSA.    EVENTS  Cough did not improve  with doxycycline (previous response to Levaquin but "bother my heart").    Past Medical History:  Patient Active Problem List   Diagnosis    Type 2 diabetes mellitus with diabetic autonomic neuropathy, with long-term current use of insulin (CMS-HCC)    Sarcoidosis    Chronic pain syndrome    Recurrent falls    Chronic pain of both knees    Gastroesophageal reflux disease, unspecified whether esophagitis present    Nausea    Essential hypertension    Itching    Mixed hyperlipidemia    Anxiety    Allergy, initial encounter    S/P placement of cardiac pacemaker    Vitamin D deficiency    Fibromyalgia    Chronic pain of both shoulders    Bilateral hip pain    Chronic cough       Past Surgical History:  Past Surgical History:   Procedure Laterality Date    AICD pacemaker N/A 09/2018    CHOLECYSTECTOMY      HYSTERECTOMY      KNEE ARTHROSCOPY Left      Past Surgical History:   Procedure Laterality Date    AICD pacemaker N/A 09/2018    CHOLECYSTECTOMY      HYSTERECTOMY      KNEE ARTHROSCOPY Left        Medications: (Home)  Outpatient Medications Marked as Taking for the 01/29/22 encounter (Office  Visit) with Eda Keys, MD   Medication Sig Dispense Refill    albuterol (PROVENTIL) (2.5 MG/3ML) 0.083% nebulization 3 mL (2.5 mg) by Nebulization route every 4 hours as needed for Wheezing. 1 each 2    albuterol 108 (90 Base) MCG/ACT inhaler Inhale 2 puffs by mouth every 6 hours as needed for Wheezing. 1 each 0    Albuterol Sulfate 108 (90 Base) MCG/ACT AEPB Inhale 1 puff by mouth as needed.      ASPIRIN LOW DOSE 81 MG EC tablet TAKE 1 TABLET BY MOUTH EVERY DAY 120 tablet 1    atorvastatin (LIPITOR) 40 MG tablet Take 1 tablet (40 mg) by mouth daily. 90 tablet 1    continuous blood glucose (DEXCOM G7) receiver 1 Device by IntraDERMAL route daily. Use daily to read glucose topically. 1 each 2    controlled substance agreement controlled substance agreement 1 each 0    doxycycline (MONODOX) 100 MG capsule Take 1 capsule  (100 mg) by mouth 2 times daily. 60 capsule 1    famotidine (PEPCID) 40 MG tablet Take 1 tablet (40 mg) by mouth daily. 90 tablet 1    fexofenadine (ALLEGRA) 180 MG tablet Take 1 tablet (180 mg) by mouth daily. 90 tablet 1    fluticasone-umeclidinium-vilanterol (TRELEGY ELLIPTA) 100-62.5-25 MCG/INH AEPB Inhale 1 puff by mouth daily.      folic acid (FOLVITE) 1 MG tablet TAKE 1 TABLET BY MOUTH EVERY DAY 90 tablet 1    guaiFENesin-codeine (ROBITUSSIN AC) 100-10 MG/5ML oral solution Take 5 mL by mouth every 6 hours as needed for Cough. 120 mL 0    hydrOXYzine HCL (ATARAX) 50 MG tablet Take 1 tablet (50 mg) by mouth 2 times daily as needed for Itching. (Patient taking differently: Take 1 tablet (50 mg) by mouth 2 times daily as needed for Itching. hives) 180 tablet 3    Insulin Glargine Solostar (LANTUS SOLOSTAR) 100 UNIT/ML SOPN Inject 60 Units under the skin nightly. 15 mL 3    insulin lispro, 1 Unit Dial, (HUMALOG KWIKPEN) 100 units/mL INJECT 20 UNITS UNDER THE SKIN 3 TIMES DAILY AS NEEDED FOR HIGH BLOOD SUGAR. IN THE MORNING 20 UNITS + SLIDING SCALE MID DAY: 17 + SLIDING SCALE AND IN THE EVENING 12 + SLIDING SCALE 15 mL 11    Insulin Pen Needles (BD PEN NEEDLE NANO 2ND GEN) 32G X 4 MM MISC Inject 1 each under the skin 4 times daily. With each insulin administration 400 each 3    losartan (COZAAR) 50 MG tablet Take 1 tablet (50 mg) by mouth daily. 90 tablet 0    metoprolol tartrate (LOPRESSOR) 50 MG tablet Take 1 tablet (50 mg) by mouth 2 times daily. 180 tablet 3    mirtazapine (REMERON) 30 MG tablet TAKE 1 TABLET BY MOUTH NIGHTLY 90 tablet 1    montelukast (SINGULAIR) 10 MG tablet Take 1 tablet (10 mg) by mouth every evening. 90 tablet 3    mupirocin (BACTROBAN) 2 % ointment APPLY 1 APPLICATION TOPICALLY 2 TIMES DAILY. USE A SMALL AMOUNT AS DIRECTED 22 g 1    naloxone (NARCAN) 4 mg/0.1 mL nasal spray Spray 1 spray into one nostril once as needed.      ondansetron (ZOFRAN ODT) 8 MG disintegrating tablet TAKE 1  TABLET ON OR UNDER THE TONGUE EVERY 8 HOURS AS NEEDED FOR NAUSEA VOMITING. 30 tablet 0    oxycodone-acetaminophen (PERCOCET) 10-325 MG tablet Take 0.5 tablets by mouth every 6 hours as needed for Severe  Pain (Pain Score 7-10). 30 tablet 0    pantoprazole (PROTONIX) 40 MG tablet Take 1 tablet (40 mg) by mouth every morning (before breakfast). 90 tablet 3    pregabalin (LYRICA) 75 MG capsule '75mg'$  Morning and '150mg'$  nightly 90 capsule 0       Allergies:  Allergies   Allergen Reactions    Nsaids Nausea Only and Other     Per pt: Burning of stomach lining and nausea     Sulfa Drugs Anaphylaxis    Benadryl [Diphenhydramine] Rash and Itching     Per 12/25/20 TE: Do you have any additional known allergies to over the counter or prescription medications? Yes   If yes, please list additional medication(s) dye codeine on benadryl  and explain reaction(s)itching, rash     Latex Itching     Per 12/25/20 TE: Are you allergic to latex? Yes   If yes, please explain reaction(s)itchiness     Macrolides Other     History of QT-C prolongation    Morphine Itching       Social History:  Social History     Socioeconomic History    Marital status: Married     Spouse name: Not on file    Number of children: Not on file    Years of education: Not on file    Highest education level: Not on file   Occupational History    Not on file   Tobacco Use    Smoking status: Former    Smokeless tobacco: Never   Substance and Sexual Activity    Alcohol use: Not Currently    Drug use: Never    Sexual activity: Not on file   Other Topics Concern    Not on file   Social History Narrative    Not on file     Social Determinants of Health     Financial Resource Strain: Not on file   Food Insecurity: Not on file   Transportation Needs: Not on file   Physical Activity: Not on file   Stress: Not on file   Social Connections: Not on file   Intimate Partner Violence: Low Risk  (01/29/2022)    [place] IPV     IPV Risk Score: 0   Housing Stability: Not on file     Social  History     Socioeconomic History    Marital status: Married     Spouse name: Not on file    Number of children: Not on file    Years of education: Not on file    Highest education level: Not on file   Occupational History    Not on file   Tobacco Use    Smoking status: Former    Smokeless tobacco: Never   Substance and Sexual Activity    Alcohol use: Not Currently    Drug use: Never    Sexual activity: Not on file   Other Topics Concern    Not on file   Social History Narrative    Not on file     Social Determinants of Health     Financial Resource Strain: Not on file   Food Insecurity: Not on file   Transportation Needs: Not on file   Physical Activity: Not on file   Stress: Not on file   Social Connections: Not on file   Intimate Partner Violence: Low Risk  (01/29/2022)    [place] IPV     IPV Risk Score: 0   Housing Stability:  Not on file       Family History:  Family History   Problem Relation Age of Onset    Diabetes Mother     Heart Disease Mother     Kidney Disease Mother     Hypertension Father     Alcohol/Drug Father          Review of Systems:12 point review of systems was negative except per HPI    Physical Examination:: BP 143/84 (BP Location: Left arm, BP Patient Position: Sitting, BP cuff size: Large)   Pulse 80   Resp 20   Ht '5\' 5"'$  (1.651 m)   Wt 101.4 kg (223 lb 8 oz)   SpO2 100%   BMI 37.19 kg/m    General: Well developed, well nourished obese female, in no acute distress, AAOx3  HEENT: Normocephalic, atraumatic, PERRLA, oropharynx moist  Neck: Soft, supple  Lungs: few crackles right mid-lower field posteriorly. Minimal rhonchi  CVS: Regular rate and rhythm, no murmurs, rubs, or gallops  Abdomen: Soft, nontender, nondistended, no masses palpated, No CVA tenderness bilaterally  Extremity: Warm, well perfused. No cyanosis, clubbing, 2+ edema: No focal deficits    Labs & Studies:  Common labs:   Albumin   Date Value Ref Range Status   11/28/2021 4.1 3.5 - 5.2 g/dL Final     ALT (SGPT)   Date Value  Ref Range Status   11/28/2021 18 0 - 33 U/L Final     AST (SGOT)   Date Value Ref Range Status   11/28/2021 24 0 - 32 U/L Final     BUN   Date Value Ref Range Status   11/28/2021 13 8 - 23 mg/dL Final     Calcium   Date Value Ref Range Status   11/28/2021 9.7 8.5 - 10.6 mg/dL Final     Chloride   Date Value Ref Range Status   11/28/2021 100 98 - 107 mmol/L Final     Cholesterol   Date Value Ref Range Status   10/30/2020 217 <200 mg/dL Final     Comment:     Borderline Risk 200-240 mg/dL   High Risk > 240 mg/dL       Creatinine   Date Value Ref Range Status   11/28/2021 0.76 0.51 - 0.95 mg/dL Final     GFR   Date Value Ref Range Status   11/21/2020 >60 mL/min Final     Comment:     This is an estimated glomerular filtration rate   (mL/min/1.73 m2) based on the MDRD equation.   CKD Stage 3: GFR 30-59   CKD Stage 4: GFR 15-29   CKD Stage 5: GFR  < 15 or dialysis dependent       Glucose   Date Value Ref Range Status   11/28/2021 240 (H) 70 - 99 mg/dL Final     HDL-Cholesterol   Date Value Ref Range Status   10/30/2020 72 mg/dL Final     Comment:     An HDL Cholesterol <40 mg/dL is a risk factor for  coronary heart disease.       Hgb   Date Value Ref Range Status   11/28/2021 10.3 (L) 11.2 - 15.7 gm/dL Final     Glyco Hgb (A1C)   Date Value Ref Range Status   04/02/2021 7.2 (H) 4.8 - 5.8 % Final     Comment:     Hemoglobin A1c values of 5.7-6.4 percent indicate an increased risk for   developing  diabetes mellitus. Hemoglobin A1c values greater than or equal   to 6.5 percent are diagnostic of diabetes mellitus. Diagnosis should be   confirmed by repeating the Hb A1c test. Hb F higher than 10 percent of   total Hb may yield falsely low results. Conditions that shorten red cell   survival, such as the presence of unstable hemoglobins like Hb SS, Hb CC,   and Hb SC, or other causes of hemolytic anemia may yield falsely low   results. Iron deficiency anemia may yield falsely high results.        Magnesium   Date Value Ref  Range Status   05/12/2020 2.1 1.6 - 2.4 mg/dL Final     Plt Count   Date Value Ref Range Status   11/28/2021 271 140 - 370 1000/mm3 Final     Potassium   Date Value Ref Range Status   11/28/2021 4.3 3.5 - 5.1 mmol/L Final     Sodium   Date Value Ref Range Status   11/28/2021 142 136 - 145 mmol/L Final     Triglycerides   Date Value Ref Range Status   10/30/2020 129 10 - 170 mg/dL Final     WBC   Date Value Ref Range Status   11/28/2021 5.3 4.0 - 10.0 1000/mm3 Final       Urinalysis:   Color   Date Value Ref Range Status   04/16/2021 Yellow Yellow Final   05/13/2020 Yellow Yellow Final     Appearance   Date Value Ref Range Status   04/16/2021 Clear Clear Final   05/13/2020 Turbid Clear Final     Glucose   Date Value Ref Range Status   04/16/2021 Negative Negative Final   05/13/2020 4+ (A) Negative Final     Bilirubin   Date Value Ref Range Status   04/16/2021 Negative Negative Final   05/13/2020 Negative Negative Final     Ketones   Date Value Ref Range Status   04/16/2021 Negative Negative Final   05/13/2020 3+ (A) Negative Final     Specific Gravity   Date Value Ref Range Status   04/16/2021 >=1.030 1.002 - 1.030 Final   05/13/2020 1.030 1.002 - 1.030 Final     Blood   Date Value Ref Range Status   04/16/2021 Negative Negative Final   05/13/2020 Negative Negative Final     pH   Date Value Ref Range Status   04/16/2021 5.5 5.0 - 8.0 Final   05/13/2020 6.0 5.0 - 8.0 Final     Protein   Date Value Ref Range Status   04/16/2021 Trace (A) Negative Final   05/13/2020 2+ (A) Negative Final     Urobilinogen   Date Value Ref Range Status   04/16/2021 0.2 0.2 - 1.0 Final   05/13/2020 1+ (A) Negative Final     Nitrite   Date Value Ref Range Status   04/16/2021 Negative Negative Final   05/13/2020 Negative Negative Final     Leuk Esterase   Date Value Ref Range Status   04/16/2021 Negative Negative Final   05/13/2020 75 (A) Negative Leu/uL Final     Comment:     Urine culture added by reflex based on this result.  Test  interpretation:  25 Leu/uL = Trace  75 Leu/uL = 1+  250 Leu/uL = 2+  500 Leu/uL = 3+       WBC   Date Value Ref Range Status   05/13/2020 6-10 (A) 0-2/HPF Final  Comment:     The absence of >5-10 WBC, visible bacteria, leukocyte esterase, and nitrite   in urine has about 92- 98% negative predictive value for detecting urinary   tract infections. Performing urine cultures in these circumstances are   unlikely to provide clinical benefit.       RBC   Date Value Ref Range Status   05/13/2020 3-5 (A) 0-2/HPF Final       Microbiology:  No results found for: "BLOODCULT", "FUNGALBC", "AFBBACTCULT", "URINECULTURE", "QTFERON", "QUANTIFERON", "CRYPTOAG", "CRYPTOCAGCSF", "COCCICF", "COCCICFCSF", "COCCIIMMDIIF", "COCCIIMMCSF", "HISTOAGUR", "CMVDNAQT", "CMVDNAQTCSF"    Sodium   Date Value Ref Range Status   11/28/2021 142 136 - 145 mmol/L Final     Potassium   Date Value Ref Range Status   11/28/2021 4.3 3.5 - 5.1 mmol/L Final       Glyco Hgb (A1C)   Date Value Ref Range Status   04/02/2021 7.2 (H) 4.8 - 5.8 % Final     Comment:     Hemoglobin A1c values of 5.7-6.4 percent indicate an increased risk for   developing diabetes mellitus. Hemoglobin A1c values greater than or equal   to 6.5 percent are diagnostic of diabetes mellitus. Diagnosis should be   confirmed by repeating the Hb A1c test. Hb F higher than 10 percent of   total Hb may yield falsely low results. Conditions that shorten red cell   survival, such as the presence of unstable hemoglobins like Hb SS, Hb CC,   and Hb SC, or other causes of hemolytic anemia may yield falsely low   results. Iron deficiency anemia may yield falsely high results.          Radiologic Studies:      Other:  Sputum culture    PFT 01/29/22        Assessment/Plan:  66 year old female with sarcoidosis and recurrent chest infections. 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 Also on chronic immunosuppression with methotrexate and prednisone (now just on prednisone).    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  To get sputum culture since doxycycline did not work.      Sever cough: acute on chronic, on lyrica. Wanted Tussionex but discussed about side effects. Will try robitussin with codeine as alrternative    On lyrica for chronic cough.    Symptoms of tiredness worsened after COVID infection March 2020      [place] and Flu vaccine: "those things make me sicker".    Sputum culture    CT Chest    RTC 3-4 weeks    Nicole Kindred, MD

## 2022-01-29 NOTE — Procedures (Signed)
No note

## 2022-01-29 NOTE — Progress Notes (Signed)
Subjective:  Kimberly Curtis is a 66 year old female  who was evaluated by Lorelee Cover, MD presenting with:   Chief Complaint   Patient presents with    Follow Up     Problem list was updated and reviewed today with patient.    HPI:     1)  Sarcoidosis:  She has been diagnosed for the last 30 years in [place].  Patient is oxygen dependent and has been evaluated by Pulmonary 2 months ago. She was seen by Pulmonary this morning, they are ordering sputum culture and a new CT scan to recheck progression. .The concern is that she may have to be placed on IV antibiotics. She has not done the repeat sleep study yet. 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 She needs to have a SPECT scan ASAP.     2) Hypertensive Heart Disease:  She continues to have occasional elevated numbers at home and may need an optimization of the medications. She has been having pain constantly which aggravates the BP as well. She has been better over the last 2 weeks. We will follow. She has been seen by Cardiology on 01/06/2022    3)  Uncontrolled Type 2 Diabetes Mellitus with Hyperglycemia  with current [place] of insulin(CMS-HCC):  On Dexcom review she is having an average of BS 210 and HbA1c is 8.3.  She has has Lantus 39 at night, and Humalog as per sliding scale. We wil increase her Lantus to 60 units and Humalog as per sliding scale. She will see the Endocrinologist. She has been skipping meals, we urged her to be more compliant with her diet and medication. We will follow.    4) Iron deficiency Anemia of chronic disease:  We rechecked her Hb and it is 12 mg/dl. We will continue to monitor. Stable condition. She needs to continue her medications. We will follow.     5) Chronic Pain Syndrome:  She has continued to be in severe pain, she has not been taking the medications as often as she needs them to not overload her system. She is "always in pain".     6) Chronic Hypoxic  Respiratory Failure:  She continues to be oxygen dependent 24 hours a day at 3 LPM Nasal Canula.     7) Bilateral Leg Edema:  She has also been complaining of puffiness in hands and feet. She was taken off diuretic by Cardiology. We will reincorporate HCTZ as needed. We asked her not to take this daily. We need to be careful with her electrolytes as well.       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      Social History     Socioeconomic History    Marital status: Married   Tobacco Use    Smoking status: Former    Smokeless tobacco: Never   Substance and Sexual Activity    Alcohol use: Not Currently    Drug use: Never     Social Determinants of Health     Intimate Partner Violence: Low Risk  (01/29/2022)    [place] IPV     IPV Risk Score: 0    Past Surgical History:   Procedure Laterality Date    AICD pacemaker N/A 09/2018    CHOLECYSTECTOMY      HYSTERECTOMY      KNEE ARTHROSCOPY Left       Family History   Problem Relation Name Age of Onset    Diabetes Mother      Heart Disease Mother      Kidney Disease Mother      Hypertension Father      Alcohol/Drug Father        Past  Medical History:   Diagnosis Date    Diabetes mellitus (CMS-HCC)     Hypertension     Sarcoidosis of lung (CMS-HCC)     bilat    Tachycardia 09/2018        Health Maintenance:  Health Maintenance Due   Topic Date Due    Breast Cancer Screen  Never done    COVID-19 Vaccine (1) Never done    Shingles Vaccine (1 of 2) Never done    Medicare Annual Wellness Visit  Never done    PHQ9 Depression Monitoring doc flowsheet  07/30/2021    Diabetes A1c  09/30/2021    LDL Monitoring  10/30/2021        Allergies and medications reviewed and updated as below.    Allergies   Allergen Reactions    Nsaids Nausea Only and Other     Per pt: Burning of stomach lining and nausea     Sulfa Drugs Anaphylaxis    Benadryl [Diphenhydramine] Rash and Itching     Per 12/25/20 TE: Do you have any additional known allergies to over the counter or prescription medications? Yes   If yes, please list additional medication(s) dye codeine on benadryl  and explain reaction(s)itching, rash     Latex Itching     Per 12/25/20 TE: Are you allergic to latex? Yes   If yes, please explain reaction(s)itchiness     Levaquin [Levofloxacin] Other     Hospitalized for "heart" problem    Macrolides Other     History of QT-C prolongation    Morphine Itching       Current Outpatient Medications   Medication Sig    albuterol (PROVENTIL) (2.5 MG/3ML) 0.083% nebulization 3 mL (2.5 mg) by Nebulization route every 4 hours as needed for Wheezing.    albuterol 108 (90 Base) MCG/ACT inhaler Inhale 2 puffs by mouth every 6 hours as needed for Wheezing.    Albuterol Sulfate 108 (90 Base) MCG/ACT AEPB Inhale 1 puff by mouth as needed.    ASPIRIN LOW DOSE 81 MG EC tablet TAKE 1 TABLET BY MOUTH EVERY DAY    atorvastatin (LIPITOR) 40 MG tablet Take 1 tablet (40 mg) by mouth daily.    continuous blood glucose (DEXCOM G7) receiver 1 Device by IntraDERMAL route daily. Use daily to read glucose topically.  controlled substance agreement controlled substance agreement    doxycycline  (MONODOX) 100 MG capsule Take 1 capsule (100 mg) by mouth 2 times daily.    famotidine (PEPCID) 40 MG tablet Take 1 tablet (40 mg) by mouth daily.    fexofenadine (ALLEGRA) 180 MG tablet Take 1 tablet (180 mg) by mouth daily.    fluticasone-umeclidinium-vilanterol (TRELEGY ELLIPTA) 100-62.5-25 MCG/INH AEPB Inhale 1 puff by mouth daily.    folic acid (FOLVITE) 1 MG tablet TAKE 1 TABLET BY MOUTH EVERY DAY    guaiFENesin-codeine (ROBITUSSIN AC) 100-10 MG/5ML oral solution Take 5 mL by mouth every 6 hours as needed for Cough.    hydrOXYzine HCL (ATARAX) 50 MG tablet Take 1 tablet (50 mg) by mouth 2 times daily as needed for Itching. (Patient taking differently: Take 1 tablet (50 mg) by mouth 2 times daily as needed for Itching. hives)    Insulin Glargine Solostar (LANTUS SOLOSTAR) 100 UNIT/ML SOPN Inject 60 Units under the skin nightly.    insulin lispro, 1 Unit Dial, (HUMALOG KWIKPEN) 100 units/mL INJECT 20 UNITS UNDER THE SKIN 3 TIMES DAILY AS NEEDED FOR HIGH BLOOD SUGAR. IN THE MORNING 20 UNITS + SLIDING SCALE MID DAY: 17 + SLIDING SCALE AND IN THE EVENING 12 + SLIDING SCALE    Insulin Pen Needles (BD PEN NEEDLE NANO 2ND GEN) 32G X 4 MM MISC Inject 1 each under the skin 4 times daily. With each insulin administration    losartan (COZAAR) 50 MG tablet Take 1 tablet (50 mg) by mouth daily.    metoprolol tartrate (LOPRESSOR) 50 MG tablet Take 1 tablet (50 mg) by mouth 2 times daily.    mirtazapine (REMERON) 30 MG tablet TAKE 1 TABLET BY MOUTH NIGHTLY    montelukast (SINGULAIR) 10 MG tablet Take 1 tablet (10 mg) by mouth every evening.    mupirocin (BACTROBAN) 2 % ointment APPLY 1 APPLICATION TOPICALLY 2 TIMES DAILY. USE A SMALL AMOUNT AS DIRECTED    naloxone (NARCAN) 4 mg/0.1 mL nasal spray Spray 1 spray into one nostril once as needed.    ondansetron (ZOFRAN ODT) 8 MG disintegrating tablet TAKE 1 TABLET ON OR UNDER THE TONGUE EVERY 8 HOURS AS NEEDED FOR NAUSEA VOMITING.    oxycodone-acetaminophen (PERCOCET) 10-325 MG  tablet Take 0.5 tablets by mouth every 6 hours as needed for Severe Pain (Pain Score 7-10).    pantoprazole (PROTONIX) 40 MG tablet Take 1 tablet (40 mg) by mouth every morning (before breakfast).    pregabalin (LYRICA) 75 MG capsule '75mg'$  Morning and '150mg'$  nightly     No current facility-administered medications for this visit.     ROS:   Review of Systems   Constitutional:  Positive for decreased activity due to SOB, appetite change and fatigue. Negative for chills, fever and unexpected weight change.   HENT:  Positive for congestion, postnasal drip, Negative for tinnitus and trouble swallowing.    Eyes: Negative.    Respiratory:  Positive for cough, chest tightness and shortness of breath. Negative for choking, wheezing and stridor.    Cardiovascular:  Positive for chest pain, palpitations and leg swelling.   Genitourinary: Negative.    Musculoskeletal:  Positive for arthralgias (Both hands and back), back pain and joint swelling.        Gait instability present all the time.    Skin:  Negative at this time  Allergic/Immunologic: Positive for environmental allergies.   Neurological:  Positive for dizziness, weakness, light-headedness and headaches. Negative for seizures, syncope and speech  difficulty.   Psychiatric/Behavioral:  Positive for decreased concentration and sleep disturbance. Negative for behavioral problems, confusion, self-injury and suicidal ideas. The patient is not hyperactive.    Objective:       Physical Exam  Constitutional:       Appearance: She is obese. She is ill-appearing and diaphoretic. Oxygen Dependent.    HENT:      Head: Normocephalic and atraumatic.      Right Ear: Tympanic membrane and ear canal normal.      Left Ear: Tympanic membrane and ear canal normal.      Mouth/Throat:      Mouth: Mucous membranes are moist.      Pharynx: Oropharynx is clear.  No oropharyngeal exudate.   Eyes:      Extraocular Movements: Extraocular movements intact.      Conjunctiva/sclera: Conjunctivae  normal.      Pupils: Pupils are equal, round, and reactive to light.   Cardiovascular:      Rate and Rhythm: Normal rate and regular rhythm.      Pulses:           Dorsalis pedis pulses are 2+ on the right side and 2+ on the left side.      Heart sounds: Normal heart sounds.   Pulmonary:      Effort: Pulmonary effort is normal.      Breath sounds: Normal breath sounds.   Musculoskeletal:         General: Swelling and tenderness (both hands and Lower back.) present.      Cervical back: Normal range of motion and neck supple.      Right lower leg: Edema present.      Left lower leg: Edema present.      Right foot: Normal range of motion. No deformity, bunion or foot drop.      Left foot: Normal range of motion. No deformity, bunion or foot drop.   Skin:     General: Skin is warm.      Findings: Rash present.   Neurological:      General: No focal deficit present.      Mental Status: She is alert and oriented to person, place, and time.   Psychiatric:         Mood and Affect: Mood normal.         Behavior: Behavior normal.      01/29/22  1135   BP: 103/70   Pulse: 77   Temp: 98.2 F (36.8 C)   Resp: 18   SpO2: 100%     Body mass index is 37.11 kg/m.         Labs and chart reviewed.    Lab Results   Component Value Date    A1C 7.2 (H) 04/02/2021    A1C 7.3 (H) 10/30/2020    A1C 8.4 (H) 07/09/2020     Lab Results   Component Value Date    CHOL 217 10/30/2020    HDL 72 10/30/2020    LDLCALC 119 10/30/2020    TRIG 129 10/30/2020     Lab Results   Component Value Date    TSH 1.56 11/28/2021       Assessment and Plan:       1)  Sarcoidosis:  She has been diagnosed for the last 30 years in [place].  Patient is oxygen dependent and has been evaluated by Pulmonary 2 months ago. She was seen by Pulmonary this morning, they are ordering sputum culture and  a new CT scan to recheck progression. .The concern is that she may have to be placed on IV antibiotics. She has not done the repeat sleep study yet. Cardiac sarcoid  complicated by VF s/p ICD, She has stopped all her immunosuppressant drugs. She needs to have a SPECT scan ASAP.     2) Hypertensive Heart Disease:  She continues to have occasional elevated numbers at home and may need an optimization of the medications. She has been having pain constantly which aggravates the BP as well. She has been better over the last 2 weeks. We will follow. She has been seen by Cardiology on 01/06/2022    3)  Uncontrolled Type 2 Diabetes Mellitus with Hyperglycemia  with current [place] of insulin(CMS-HCC):  On Dexcom review she is having an average of BS 210 and HbA1c is 8.3.  She has has Lantus 39 at night, and Humalog as per sliding scale. We wil increase her Lantus to 60 units and Humalog as per sliding scale. She will see the Endocrinologist. She has been skipping meals, we urged her to be more compliant with her diet and medication. We will follow.    4) Iron deficiency Anemia of chronic disease:  We rechecked her Hb and it is 12 mg/dl. We will continue to monitor. Stable condition. She needs to continue her medications. We will follow.     5) Chronic Pain Syndrome:  She has continued to be in severe pain, she has not been taking the medications as often as she needs them to not overload her system. She is "always in pain".     6) Chronic Hypoxic Respiratory Failure:  She continues to be oxygen dependent 24 hours a day at 3 LPM Nasal Canula.     7) Bilateral Leg Edema:  She has also been complaining of puffiness in hands and feet. She was taken off diuretic by Cardiology. We will reincorporate HCTZ as needed. We asked her not to take this daily. We need to be careful with her electrolytes as well.         Orders Places: There are no diagnoses linked to this encounter.   Referrals:   No referrals found.      Return to Clinic: No follow-ups on file.     Patient Instructions: There are no Patient Instructions on file for this visit.   Level of Service Calculator Selections    The chosen level  of service has been established through calculations. A cumulative duration of 30 minutes has been allocated to activities related to the patient's visit today, encompassing both face-to-face and non-face-to-face interactions. Please note that this timeframe does not encompass separately reportable services or procedures.    For further information regarding patient assessment, treatment, and the encounter provider, please refer to the accompanying note.     Barriers to learning assessed: None.  Patient/family verbalizes understanding and is agreeable to above plan.  Medications were reconciled during this visit, and complex decision making was performed. Discussed diagnoses and plans of care. Discussed importance to take medications as directed, potential side effects, purpose and to immediately report to healthcare provider for any adverse effects.  Reviewed 911 or Emergency Department precautions, and when to seek further medical care for emergent conditions.  All questions and concerns answered to patient/family satisfaction.The plan was carefully reviewed verbally with the patient/family member/caregiver, and also affirmed that the patient/family member/caregiver understood next steps and follow up plan.    Reviewed verbally and AVS available via MyChart for  patient.      Lorelee Cover, MD   Signature Derived From Controlled Access [place], January 29, 2022, 11:39 AM.  [facility]

## 2022-02-03 DIAGNOSIS — R059 Cough, unspecified: Secondary | ICD-10-CM | POA: Insufficient documentation

## 2022-02-03 DIAGNOSIS — D869 Sarcoidosis, unspecified: Secondary | ICD-10-CM | POA: Insufficient documentation

## 2022-02-04 ENCOUNTER — Other Ambulatory Visit (INDEPENDENT_AMBULATORY_CARE_PROVIDER_SITE_OTHER): Payer: No Typology Code available for payment source | Attending: Pulmonary Medicine

## 2022-02-04 ENCOUNTER — Encounter (INDEPENDENT_AMBULATORY_CARE_PROVIDER_SITE_OTHER): Payer: Self-pay | Admitting: Hospital

## 2022-02-06 ENCOUNTER — Encounter (INDEPENDENT_AMBULATORY_CARE_PROVIDER_SITE_OTHER): Payer: Self-pay | Admitting: Pulmonary Medicine

## 2022-02-06 DIAGNOSIS — D869 Sarcoidosis, unspecified: Secondary | ICD-10-CM

## 2022-02-06 DIAGNOSIS — R059 Cough, unspecified: Secondary | ICD-10-CM

## 2022-02-06 LAB — SPIROMETRY
FEF 25/75 % PREDICTED: 46 L/sec
FEF 25/75: 0.88 L/sec
FEV1 % PREDICTED: 64 L
FEV1/FVC % PREDICTED: 91 %
FEV1/FVC: 0.73 %
FEV1: 1.35 L
FVC % PREDICTED: 69 L
FVC: 1.86 L

## 2022-02-06 NOTE — Telephone Encounter (Signed)
From: Yosselyn Mcphail  To: Eda Keys, MD  Sent: 02/06/2022 11:25 AM PST  Subject: Need strong cough syrup     Dear Dr Cletis Media I for got to ask you for a sting cough syrup I'm coughing so much can't sleep at night I need something to stop me from coughing can you please send a cough syrup to the [facility] on [address] in [place] would truly appreciate it

## 2022-02-07 ENCOUNTER — Encounter (INDEPENDENT_AMBULATORY_CARE_PROVIDER_SITE_OTHER): Payer: No Typology Code available for payment source | Admitting: Internal Medicine

## 2022-02-07 LAB — RESPIRATORY CULTURE W/GRAM STAIN: Respiratory Culture Result: NORMAL

## 2022-02-08 ENCOUNTER — Ambulatory Visit (HOSPITAL_COMMUNITY): Payer: No Typology Code available for payment source

## 2022-02-11 ENCOUNTER — Encounter (INDEPENDENT_AMBULATORY_CARE_PROVIDER_SITE_OTHER): Payer: Self-pay | Admitting: Internal Medicine

## 2022-02-12 ENCOUNTER — Telehealth (INDEPENDENT_AMBULATORY_CARE_PROVIDER_SITE_OTHER): Payer: Self-pay | Admitting: Internal Medicine

## 2022-02-12 NOTE — Telephone Encounter (Signed)
Received documents from [employer]. Forms placed in Dr.Diaz-Gonzalez, Vicente in box. Once MD reviews and signs, records will be faxed to number provided on forms. Once successfully faxed, records will be scanned into Epic and will be available under Media Tab for further viewing.

## 2022-02-14 ENCOUNTER — Other Ambulatory Visit (INDEPENDENT_AMBULATORY_CARE_PROVIDER_SITE_OTHER): Payer: Self-pay | Admitting: Internal Medicine

## 2022-02-14 DIAGNOSIS — I10 Essential (primary) hypertension: Secondary | ICD-10-CM

## 2022-02-17 ENCOUNTER — Encounter (INDEPENDENT_AMBULATORY_CARE_PROVIDER_SITE_OTHER): Payer: Self-pay | Admitting: Internal Medicine

## 2022-02-17 DIAGNOSIS — I10 Essential (primary) hypertension: Secondary | ICD-10-CM

## 2022-02-17 MED ORDER — LOSARTAN POTASSIUM 50 MG OR TABS
50.0000 mg | ORAL_TABLET | Freq: Every day | ORAL | 0 refills | Status: DC
Start: 2022-02-17 — End: 2022-04-22

## 2022-02-17 NOTE — Telephone Encounter (Signed)
From: Kimberly Curtis  To: Lorelee Cover, MD  Sent: 02/17/2022 12:33 PM PST  Subject: Losartan refill attention [name]     Can you please have the doctor to send a new prescription to [facility] for my Losartan '50mg'$  please thank you so much

## 2022-02-18 NOTE — Telephone Encounter (Signed)
Approved 02/17/2022 for 90+0 refills

## 2022-02-20 ENCOUNTER — Telehealth: Payer: Self-pay | Admitting: Pharmacist

## 2022-02-20 NOTE — Telephone Encounter (Signed)
Comprehensive Medication Review     Contacted patient via MyChart to schedule a CMR with Eric Dehner, PharmD.     Sent MyChart message to patient. Provided contact number for [facility] MTM program & requested patient to call back if they wish to schedule appointment.     Attempt: 1    Judy Bracamontes, CPhT  Pharmacy Technician III (MTM)  [facility]  Email: [email or web address]  Phone: [phone]

## 2022-02-24 ENCOUNTER — Encounter (INDEPENDENT_AMBULATORY_CARE_PROVIDER_SITE_OTHER): Payer: No Typology Code available for payment source | Admitting: Heart Failure and Transplant Cardiology

## 2022-02-25 ENCOUNTER — Encounter (INDEPENDENT_AMBULATORY_CARE_PROVIDER_SITE_OTHER): Payer: Self-pay | Admitting: Internal Medicine

## 2022-02-25 DIAGNOSIS — G894 Chronic pain syndrome: Secondary | ICD-10-CM

## 2022-02-25 DIAGNOSIS — M797 Fibromyalgia: Secondary | ICD-10-CM

## 2022-02-25 NOTE — Telephone Encounter (Signed)
From: Riti Nordlund  To: Lorelee Cover, MD  Sent: 02/25/2022 2:49 PM PST  Subject: Attention Olukemi Panchal     Can you please have Dr Patsey Berthold call in a refill for the oxycodone 10-325 to [facility] please I'm in a lot of pain , I truly appreciate it thank you

## 2022-02-26 MED ORDER — OXYCODONE-ACETAMINOPHEN 10-325 MG OR TABS
0.50 | ORAL_TABLET | Freq: Four times a day (QID) | ORAL | 0 refills | Status: DC | PRN
Start: 2022-02-26 — End: 2022-03-28

## 2022-03-03 ENCOUNTER — Encounter (INDEPENDENT_AMBULATORY_CARE_PROVIDER_SITE_OTHER): Payer: Self-pay | Admitting: Pulmonary Medicine

## 2022-03-03 ENCOUNTER — Ambulatory Visit (INDEPENDENT_AMBULATORY_CARE_PROVIDER_SITE_OTHER): Payer: No Typology Code available for payment source | Admitting: Pulmonary Medicine

## 2022-03-03 ENCOUNTER — Encounter (INDEPENDENT_AMBULATORY_CARE_PROVIDER_SITE_OTHER): Payer: Self-pay

## 2022-03-03 VITALS — BP 138/79 | HR 85 | Temp 96.9°F | Resp 16 | Ht 65.0 in | Wt 227.5 lb

## 2022-03-03 DIAGNOSIS — R059 Cough, unspecified: Secondary | ICD-10-CM

## 2022-03-03 MED ORDER — DOXYCYCLINE MONOHYDRATE 100 MG OR CAPS
100.0000 mg | ORAL_CAPSULE | Freq: Two times a day (BID) | ORAL | 3 refills | Status: AC
Start: 2022-03-03 — End: ?

## 2022-03-03 NOTE — Progress Notes (Signed)
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    Other PMHx significant for HTN, elevated BMI, and T2DM/peripheral neuropathy    Patient was first diagnosed to have sarcoidosis ~1990's when she presented with GI upset and abnormal CT scan with ?interstital fibrosis. She underwent a needle biopsy "through my back" which showed diagnosis of sarcoidosis.     For the past 10 years, patient has being having frequent chest infections from 1-2 times a year to eventually 3-4 times a year. However, in 2020, patient was placed on methotrexate in order tot ry to help patient but "it wasn't doing any good" so it was stopped in Jan 2022.  Co-incidentally, in the past 2 years, she has ~6 times of chest infection/year. In 2022 so far, she has already been hospitalized 4 x, each time she responded to antibiotics.     In January 2022, her doctor in [place] placed ?bronchial/tracheal stent in patient for ?Chauncey Fischer but had to be removed after 1 month due to local irritation.     Patient also has sinus symptoms with endoscopic evidence of "yeast" and was given Diflucan for 7 days.    Twi months ago, she moved to [facility] and started her care at [facility].    Patient has been on prednisone since 2020, with baseline dose of 10 mg. Was on methotrexate for 2 years until Janaury 2022.    Patient was admitted to [facility] for pneumonia 5/17-27/2022 while visiting daughter in [place], including short stay in ICU  with [facility], and completed antibiotics (Ceftin 5/31):  repeat sputum Aug 02, 2021: MSSA.    EVENTS  "Not the greatest":  still coughing but unable to get CT chest till 12/13. Same shortness of breath?slightly better. Still has productive cough.    Past Medical History:  Patient Active Problem List   Diagnosis    Type 2 diabetes mellitus with diabetic autonomic neuropathy, with long-term current use of insulin (CMS-HCC)    Sarcoidosis    Chronic pain syndrome    Recurrent falls    Chronic pain of both knees    Gastroesophageal reflux disease, unspecified whether esophagitis present    Nausea    Essential hypertension    Itching    Mixed hyperlipidemia    Anxiety    Allergy, initial encounter    S/P placement of cardiac pacemaker    Vitamin D deficiency    Fibromyalgia    Chronic hypoxemic respiratory failure (CMS-HCC)    Chronic pain of both shoulders    Bilateral hip pain    Chronic cough    Hypertensive heart disease without HF (heart failure)       Past Surgical History:  Past Surgical History:   Procedure Laterality Date    AICD pacemaker N/A 09/2018    CHOLECYSTECTOMY      HYSTERECTOMY      KNEE ARTHROSCOPY Left      Past Surgical History:   Procedure Laterality Date    AICD pacemaker N/A 09/2018    CHOLECYSTECTOMY      HYSTERECTOMY  KNEE ARTHROSCOPY Left        Medications: (Home)  Outpatient Medications Marked as Taking for the 03/03/22 encounter (Office Visit) with Eda Keys, MD   Medication Sig Dispense Refill    albuterol (PROVENTIL) (2.5 MG/3ML) 0.083% nebulization 3 mL (2.5 mg) by Nebulization route every 4 hours as needed for Wheezing. 1 each 2    albuterol 108 (90 Base) MCG/ACT inhaler Inhale 2 puffs by mouth every 6 hours as needed for Wheezing. 1 each 0    Albuterol Sulfate 108 (90 Base) MCG/ACT AEPB Inhale 1 puff by mouth as needed.      ASPIRIN LOW DOSE 81 MG EC tablet TAKE 1 TABLET BY MOUTH EVERY DAY 120 tablet 1    atorvastatin (LIPITOR) 40 MG tablet Take 1 tablet (40 mg) by mouth daily. 90 tablet 1    controlled substance agreement controlled substance agreement 1 each 0    doxycycline (MONODOX) 100  MG capsule Take 1 capsule (100 mg) by mouth 2 times daily. 60 capsule 1    famotidine (PEPCID) 40 MG tablet Take 1 tablet (40 mg) by mouth daily. 90 tablet 1    fexofenadine (ALLEGRA) 180 MG tablet Take 1 tablet (180 mg) by mouth daily. 90 tablet 1    fluticasone-umeclidinium-vilanterol (TRELEGY ELLIPTA) 100-62.5-25 MCG/INH AEPB Inhale 1 puff by mouth daily.      folic acid (FOLVITE) 1 MG tablet TAKE 1 TABLET BY MOUTH EVERY DAY 90 tablet 1    guaiFENesin-codeine (ROBITUSSIN AC) 100-10 MG/5ML oral solution Take 5 mL by mouth every 6 hours as needed for Cough. 120 mL 0    hydroCHLOROthiazide (HYDRODIURIL) 25 MG tablet Take 1 tablet (25 mg) by mouth daily. 90 tablet 3    hydrOXYzine HCL (ATARAX) 50 MG tablet Take 1 tablet (50 mg) by mouth 2 times daily as needed for Itching. (Patient taking differently: Take 1 tablet (50 mg) by mouth 2 times daily as needed for Itching. hives) 180 tablet 3    Insulin Glargine Solostar (LANTUS SOLOSTAR) 100 UNIT/ML SOPN Inject 80 Units under the skin nightly. 15 mL 3    insulin lispro, 1 Unit Dial, (HUMALOG KWIKPEN) 100 units/mL INJECT 20 UNITS UNDER THE SKIN 3 TIMES DAILY AS NEEDED FOR HIGH BLOOD SUGAR. IN THE MORNING 20 UNITS + SLIDING SCALE MID DAY: 17 + SLIDING SCALE AND IN THE EVENING 12 + SLIDING SCALE 15 mL 11    Insulin Pen Needles (BD PEN NEEDLE NANO 2ND GEN) 32G X 4 MM MISC Inject 1 each under the skin 4 times daily. With each insulin administration 400 each 3    losartan (COZAAR) 50 MG tablet Take 1 tablet (50 mg) by mouth daily. 90 tablet 0    metoprolol tartrate (LOPRESSOR) 50 MG tablet Take 1 tablet (50 mg) by mouth 2 times daily. 180 tablet 3    mirtazapine (REMERON) 30 MG tablet TAKE 1 TABLET BY MOUTH NIGHTLY 90 tablet 1    montelukast (SINGULAIR) 10 MG tablet Take 1 tablet (10 mg) by mouth every evening. 90 tablet 3    mupirocin (BACTROBAN) 2 % ointment APPLY 1 APPLICATION TOPICALLY 2 TIMES DAILY. USE A SMALL AMOUNT AS DIRECTED 22 g 1    naloxone (NARCAN) 4 mg/0.1 mL  nasal spray Spray 1 spray into one nostril once as needed.      ondansetron (ZOFRAN ODT) 8 MG disintegrating tablet TAKE 1 TABLET ON OR UNDER THE TONGUE EVERY 8 HOURS AS NEEDED FOR NAUSEA VOMITING. 30 tablet 0  oxycodone-acetaminophen (PERCOCET) 10-325 MG tablet Take 0.5 tablets by mouth every 6 hours as needed for Severe Pain (Pain Score 7-10). 30 tablet 0    pantoprazole (PROTONIX) 40 MG tablet Take 1 tablet (40 mg) by mouth every morning (before breakfast). 90 tablet 3    pregabalin (LYRICA) 75 MG capsule '75mg'$  Morning and '150mg'$  nightly 90 capsule 0       Allergies:  Allergies   Allergen Reactions    Nsaids Nausea Only and Other     Per pt: Burning of stomach lining and nausea     Sulfa Drugs Anaphylaxis    Benadryl [Diphenhydramine] Rash and Itching     Per 12/25/20 TE: Do you have any additional known allergies to over the counter or prescription medications? Yes   If yes, please list additional medication(s) dye codeine on benadryl  and explain reaction(s)itching, rash     Latex Itching     Per 12/25/20 TE: Are you allergic to latex? Yes   If yes, please explain reaction(s)itchiness     Levaquin [Levofloxacin] Other     Hospitalized for "heart" problem    Macrolides Other     History of QT-C prolongation    Morphine Itching       Social History:  Social History     Socioeconomic History    Marital status: Married     Spouse name: Not on file    Number of children: Not on file    Years of education: Not on file    Highest education level: Not on file   Occupational History    Not on file   Tobacco Use    Smoking status: Former    Smokeless tobacco: Never   Substance and Sexual Activity    Alcohol use: Not Currently    Drug use: Never    Sexual activity: Not on file   Other Topics Concern    Not on file   Social History Narrative    Not on file     Social Determinants of Health     Financial Resource Strain: Not on file   Food Insecurity: Not on file   Transportation Needs: Not on file   Physical Activity: Not on  file   Stress: Not on file   Social Connections: Not on file   Intimate Partner Violence: Low Risk  (02/10/2022)    [place] IPV     Have you ever been emotionally or physically abused by your partner or someone important to you?: Not on file     Within the last year have you been hit, slapped, kicked or otherwise physically hurt by someone?: Not on file     Since you've been pregnant, have you been slapped, kicked or otherwise physically hurt by someone?: Not on file     Within the last year has anyone forced you to have sexual activities?: Not on file     Are you afraid of your spouse or partner you listed above?: Not on file   Housing Stability: Not on file     Social History     Socioeconomic History    Marital status: Married     Spouse name: Not on file    Number of children: Not on file    Years of education: Not on file    Highest education level: Not on file   Occupational History    Not on file   Tobacco Use    Smoking status: Former    Smokeless tobacco:  Never   Substance and Sexual Activity    Alcohol use: Not Currently    Drug use: Never    Sexual activity: Not on file   Other Topics Concern    Not on file   Social History Narrative    Not on file     Social Determinants of Health     Financial Resource Strain: Not on file   Food Insecurity: Not on file   Transportation Needs: Not on file   Physical Activity: Not on file   Stress: Not on file   Social Connections: Not on file   Intimate Partner Violence: Low Risk  (02/10/2022)    [place] IPV     Have you ever been emotionally or physically abused by your partner or someone important to you?: Not on file     Within the last year have you been hit, slapped, kicked or otherwise physically hurt by someone?: Not on file     Since you've been pregnant, have you been slapped, kicked or otherwise physically hurt by someone?: Not on file     Within the last year has anyone forced you to have sexual activities?: Not on file     Are you afraid of your spouse or partner you  listed above?: Not on file   Housing Stability: Not on file       Family History:  Family History   Problem Relation Age of Onset    Diabetes Mother     Heart Disease Mother     Kidney Disease Mother     Hypertension Father     Alcohol/Drug Father          Review of Systems:12 point review of systems was negative except per HPI    Physical Examination:: BP 138/79 (BP Location: Left arm, BP Patient Position: Sitting, BP cuff size: Large)   Pulse 85   Temp 96.9 F (36.1 C) (Temporal)   Resp 16   Ht '5\' 5"'$  (1.651 m)   Wt 103.2 kg (227 lb 8 oz)   SpO2 100%   BMI 37.86 kg/m    General: Well developed, well nourished obese female, in no acute distress, AAOx3  HEENT: Normocephalic, atraumatic, PERRLA, oropharynx moist  Neck: Soft, supple  Lungs: few crackles right mid-lower field posteriorly. Minimal rhonchi  CVS: Regular rate and rhythm, no murmurs, rubs, or gallops  Abdomen: Soft, nontender, nondistended, no masses palpated, No CVA tenderness bilaterally  Extremity: Warm, well perfused. No cyanosis, clubbing, 2+ edema: No focal deficits    Labs & Studies:  Common labs:   Albumin   Date Value Ref Range Status   11/28/2021 4.1 3.5 - 5.2 g/dL Final     ALT (SGPT)   Date Value Ref Range Status   11/28/2021 18 0 - 33 U/L Final     AST (SGOT)   Date Value Ref Range Status   11/28/2021 24 0 - 32 U/L Final     BUN   Date Value Ref Range Status   11/28/2021 13 8 - 23 mg/dL Final     Calcium   Date Value Ref Range Status   11/28/2021 9.7 8.5 - 10.6 mg/dL Final     Chloride   Date Value Ref Range Status   11/28/2021 100 98 - 107 mmol/L Final     Cholesterol   Date Value Ref Range Status   10/30/2020 217 <200 mg/dL Final     Comment:     Borderline Risk 200-240 mg/dL  High Risk > 240 mg/dL       Creatinine   Date Value Ref Range Status   11/28/2021 0.76 0.51 - 0.95 mg/dL Final     GFR   Date Value Ref Range Status   11/21/2020 >60 mL/min Final     Comment:     This is an estimated glomerular filtration rate   (mL/min/1.73  m2) based on the MDRD equation.   CKD Stage 3: GFR 30-59   CKD Stage 4: GFR 15-29   CKD Stage 5: GFR  < 15 or dialysis dependent       Glucose   Date Value Ref Range Status   11/28/2021 240 (H) 70 - 99 mg/dL Final     HDL-Cholesterol   Date Value Ref Range Status   10/30/2020 72 mg/dL Final     Comment:     An HDL Cholesterol <40 mg/dL is a risk factor for  coronary heart disease.       Hgb   Date Value Ref Range Status   11/28/2021 10.3 (L) 11.2 - 15.7 gm/dL Final     Glyco Hgb (A1C)   Date Value Ref Range Status   04/02/2021 7.2 (H) 4.8 - 5.8 % Final     Comment:     Hemoglobin A1c values of 5.7-6.4 percent indicate an increased risk for   developing diabetes mellitus. Hemoglobin A1c values greater than or equal   to 6.5 percent are diagnostic of diabetes mellitus. Diagnosis should be   confirmed by repeating the Hb A1c test. Hb F higher than 10 percent of   total Hb may yield falsely low results. Conditions that shorten red cell   survival, such as the presence of unstable hemoglobins like Hb SS, Hb CC,   and Hb SC, or other causes of hemolytic anemia may yield falsely low   results. Iron deficiency anemia may yield falsely high results.        Magnesium   Date Value Ref Range Status   05/12/2020 2.1 1.6 - 2.4 mg/dL Final     Plt Count   Date Value Ref Range Status   11/28/2021 271 140 - 370 1000/mm3 Final     Potassium   Date Value Ref Range Status   11/28/2021 4.3 3.5 - 5.1 mmol/L Final     Sodium   Date Value Ref Range Status   11/28/2021 142 136 - 145 mmol/L Final     Triglycerides   Date Value Ref Range Status   10/30/2020 129 10 - 170 mg/dL Final     WBC   Date Value Ref Range Status   11/28/2021 5.3 4.0 - 10.0 1000/mm3 Final       Urinalysis:   Color   Date Value Ref Range Status   04/16/2021 Yellow Yellow Final   05/13/2020 Yellow Yellow Final     Appearance   Date Value Ref Range Status   04/16/2021 Clear Clear Final   05/13/2020 Turbid Clear Final     Glucose   Date Value Ref Range Status   04/16/2021  Negative Negative Final   05/13/2020 4+ (A) Negative Final     Bilirubin   Date Value Ref Range Status   04/16/2021 Negative Negative Final   05/13/2020 Negative Negative Final     Ketones   Date Value Ref Range Status   04/16/2021 Negative Negative Final   05/13/2020 3+ (A) Negative Final     Specific Gravity   Date Value Ref Range Status  04/16/2021 >=1.030 1.002 - 1.030 Final   05/13/2020 1.030 1.002 - 1.030 Final     Blood   Date Value Ref Range Status   04/16/2021 Negative Negative Final   05/13/2020 Negative Negative Final     pH   Date Value Ref Range Status   04/16/2021 5.5 5.0 - 8.0 Final   05/13/2020 6.0 5.0 - 8.0 Final     Protein   Date Value Ref Range Status   04/16/2021 Trace (A) Negative Final   05/13/2020 2+ (A) Negative Final     Urobilinogen   Date Value Ref Range Status   04/16/2021 0.2 0.2 - 1.0 Final   05/13/2020 1+ (A) Negative Final     Nitrite   Date Value Ref Range Status   04/16/2021 Negative Negative Final   05/13/2020 Negative Negative Final     Leuk Esterase   Date Value Ref Range Status   04/16/2021 Negative Negative Final   05/13/2020 75 (A) Negative Leu/uL Final     Comment:     Urine culture added by reflex based on this result.  Test interpretation:  25 Leu/uL = Trace  75 Leu/uL = 1+  250 Leu/uL = 2+  500 Leu/uL = 3+       WBC   Date Value Ref Range Status   05/13/2020 6-10 (A) 0-2/HPF Final     Comment:     The absence of >5-10 WBC, visible bacteria, leukocyte esterase, and nitrite   in urine has about 92- 98% negative predictive value for detecting urinary   tract infections. Performing urine cultures in these circumstances are   unlikely to provide clinical benefit.       RBC   Date Value Ref Range Status   05/13/2020 3-5 (A) 0-2/HPF Final       Microbiology:  No results found for: "BLOODCULT", "FUNGALBC", "AFBBACTCULT", "URINECULTURE", "QTFERON", "QUANTIFERON", "CRYPTOAG", "CRYPTOCAGCSF", "COCCICF", "COCCICFCSF", "COCCIIMMDIIF", "COCCIIMMCSF", "HISTOAGUR", "CMVDNAQT",  "CMVDNAQTCSF"    Sodium   Date Value Ref Range Status   11/28/2021 142 136 - 145 mmol/L Final     Potassium   Date Value Ref Range Status   11/28/2021 4.3 3.5 - 5.1 mmol/L Final       Glyco Hgb (A1C)   Date Value Ref Range Status   04/02/2021 7.2 (H) 4.8 - 5.8 % Final     Comment:     Hemoglobin A1c values of 5.7-6.4 percent indicate an increased risk for   developing diabetes mellitus. Hemoglobin A1c values greater than or equal   to 6.5 percent are diagnostic of diabetes mellitus. Diagnosis should be   confirmed by repeating the Hb A1c test. Hb F higher than 10 percent of   total Hb may yield falsely low results. Conditions that shorten red cell   survival, such as the presence of unstable hemoglobins like Hb SS, Hb CC,   and Hb SC, or other causes of hemolytic anemia may yield falsely low   results. Iron deficiency anemia may yield falsely high results.          Radiologic Studies:      Other:  Sputum culture    PFT 01/29/22        Assessment/Plan:  66 year old female with sarcoidosis and recurrent chest infections. 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 Also on chronic immunosuppression with methotrexate and prednisone (now just on prednisone).    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  To get sputum culture since doxycycline did not work.  Sever cough: acute on chronic, on lyrica. Wanted Tussionex but discussed about side effects. Will try robitussin with codeine as alrternative    On lyrica for chronic cough.    Symptoms of tiredness worsened after COVID infection March 2020      [place] and Flu vaccine: "those things make me sicker".    Sputum culture again but start doxycycline (this time for at least 2 months since it helped last time but only partially)    CT Chest pending, main issue to look for would be bronchiectasis.    RTC 4 weeks, Consider bronchoscopy if persist.    Nicole Kindred, MD

## 2022-03-04 ENCOUNTER — Other Ambulatory Visit
Admission: RE | Admit: 2022-03-04 | Discharge: 2022-03-04 | Disposition: A | Discharge status: Home / Self Care | Payer: No Typology Code available for payment source | Attending: Pulmonary Medicine | Admitting: Pulmonary Medicine

## 2022-03-04 DIAGNOSIS — R059 Cough, unspecified: Secondary | ICD-10-CM | POA: Insufficient documentation

## 2022-03-05 ENCOUNTER — Ambulatory Visit
Admission: RE | Admit: 2022-03-05 | Discharge: 2022-03-05 | Disposition: A | Discharge status: Home / Self Care | Payer: No Typology Code available for payment source | Attending: Pulmonary Medicine | Admitting: Pulmonary Medicine

## 2022-03-05 ENCOUNTER — Encounter (INDEPENDENT_AMBULATORY_CARE_PROVIDER_SITE_OTHER): Payer: Self-pay | Admitting: Internal Medicine

## 2022-03-05 ENCOUNTER — Other Ambulatory Visit (INDEPENDENT_AMBULATORY_CARE_PROVIDER_SITE_OTHER): Payer: No Typology Code available for payment source

## 2022-03-05 DIAGNOSIS — R059 Cough, unspecified: Secondary | ICD-10-CM | POA: Insufficient documentation

## 2022-03-05 DIAGNOSIS — R918 Other nonspecific abnormal finding of lung field: Secondary | ICD-10-CM

## 2022-03-05 DIAGNOSIS — J479 Bronchiectasis, uncomplicated: Secondary | ICD-10-CM

## 2022-03-05 DIAGNOSIS — D869 Sarcoidosis, unspecified: Secondary | ICD-10-CM

## 2022-03-05 NOTE — Telephone Encounter (Signed)
Comprehensive Medication Review - Primary Care     Contacted patient via MyChart to schedule a CMR with Eric Dehner, PharmD.      Sent MyChart message to patient. Provided contact number for [facility] MTM program & requested patient to call back if they wish to schedule appointment.     Attempt: 2     Judy Bracamontes, CPhT  Pharmacy Technician III (MTM)  [facility]  Email: [email or web address]  Phone: [phone]

## 2022-03-07 NOTE — Telephone Encounter (Signed)
Comprehensive Medication Review - Primary Care     Contacted patient via telephone to schedule a CMR with Eric Dehner, PharmD.     Was unable to reach patient and left voice mail on mobile phone number. Provided contact number for [facility] MTM program & requested patient to call back if they wish to schedule appointment.     Attempt: 3 (Final Call)     Outcomes MTM will be updated as 'Unable to reach patient'      Judy Bracamontes, CPhT  Pharmacy Technician III (MTM)  [facility]  Email: [email or web address]  Phone: [phone]

## 2022-03-08 LAB — RESPIRATORY CULTURE W/GRAM STAIN: Respiratory Culture Result: NORMAL — AB

## 2022-03-11 ENCOUNTER — Encounter (INDEPENDENT_AMBULATORY_CARE_PROVIDER_SITE_OTHER): Payer: No Typology Code available for payment source | Admitting: Internal Medicine

## 2022-03-19 ENCOUNTER — Encounter (INDEPENDENT_AMBULATORY_CARE_PROVIDER_SITE_OTHER): Payer: Self-pay | Admitting: Internal Medicine

## 2022-03-19 ENCOUNTER — Other Ambulatory Visit (INDEPENDENT_AMBULATORY_CARE_PROVIDER_SITE_OTHER): Payer: Self-pay | Admitting: Internal Medicine

## 2022-03-19 DIAGNOSIS — F331 Major depressive disorder, recurrent, moderate: Secondary | ICD-10-CM

## 2022-03-19 DIAGNOSIS — T7840XA Allergy, unspecified, initial encounter: Secondary | ICD-10-CM

## 2022-03-19 DIAGNOSIS — K219 Gastro-esophageal reflux disease without esophagitis: Secondary | ICD-10-CM

## 2022-03-19 DIAGNOSIS — R638 Other symptoms and signs concerning food and fluid intake: Secondary | ICD-10-CM

## 2022-03-19 DIAGNOSIS — D869 Sarcoidosis, unspecified: Secondary | ICD-10-CM

## 2022-03-19 NOTE — Telephone Encounter (Signed)
From: Ksenia Johns  To: Lorelee Cover, MD  Sent: 03/19/2022 1:10 PM PST  Subject: Medication refills    Dear Dr. I need new prescription for the following medication pantoprazole, folic acid, montelukast sod '10mg'$  ,mirtazapine '30mg'$  sent to [facility] please thank you so much

## 2022-03-19 NOTE — Telephone Encounter (Signed)
[facility]     NARF is not currently included in the Pharmacy Refill Clinic protocols. Re-routing to the responsible staff for processing.  Thank you

## 2022-03-19 NOTE — Telephone Encounter (Signed)
[facility]     Scheduling Request:   [facility]     Please remind pt to schedule f/u appt w Dr Lorelee Cover, for f/u with GI    *Please inform pt to wait to complete any ordered labs until AFTER their scheduled visit, unless otherwise instructed by their provider*    Authorized 90 days supply + 0 RF until f/u scheduled.      ---------------------------------------------------------------------------    Folic Acid 1 mg is not currently included in the Pharmacy Refill Clinic protocols. Re-routing to the responsible staff for processing.  Thank you          ---------------------------------------------------------------------------  The following meds were approved per protocol:    Medications ordered in this Encounter   Medications    montelukast (SINGULAIR) 10 MG tablet     Sig: Take 1 tablet (10 mg) by mouth every evening.     Dispense:  90 tablet     Refill:  3    pantoprazole (PROTONIX) 40 MG tablet     Sig: Take 1 tablet (40 mg) by mouth every morning (before breakfast).     Dispense:  90 tablet     Refill:  0

## 2022-03-19 NOTE — Telephone Encounter (Signed)
*The requested med passed all protocol parameters in the associated med info guide - Protocol details reviewed by pharmacist.   Sending Mirtazapine to [facility] in separate encounter     Proton Pump Inhibitor Refill Protocol    Last visit in enc specialty: 01/29/2022     Recent Visits in This Encounter Department       Date Provider Department Visit Type Primary Dx    01/29/2022 Lorelee Cover, MD [place] [facility] Visit Type 2 diabetes mellitus with other specified complication, with long-term current use of insulin (CMS-HCC)    12/20/2021 Lorelee Cover, [place] [facility] Visit Type 2 diabetes mellitus without complication, with long-term current use of insulin (CMS-HCC)    09/04/2021 [name], MD [facility] Non-Face-to-Face Chronic pain syndrome    08/28/2021 [name], MD [facility] Visit Chronic cough    04/29/2021 [name], MD [facility] [place] Non-Face-to-Face Chronic pain syndrome           Population Health Visits  Recent [facility] Visits    None       Next appt in enc specialty: 03/28/2022      Future Appointments 03/19/2022 - 03/18/2027        Date Visit Type Department Provider     03/28/2022 10:20 AM RETURN PRIMARY CARE PATIENT [facility] Lillie Fragmin, MD    Appointment Notes:     follow up             03/31/2022 10:40 AM [place] Lora Paula, MD    Appointment Notes:     2 month F/U             04/09/2022  8:30 AM RETURN [facility] Eda Keys, MD    Appointment Notes:     4w             06/09/2022 12:40 PM EP RETURN [facility]  Arrive at: [place], 1st Floor, [place], Emmaline Life, MD    Appointment Notes:     EP Ret Dx: Essential hypertension (Per MD Ok to [facility])                        LABS required:  (None)    Monitoring required:  (None)    Luan Moore, CPhT  (Rx Refill and [facility])      Leukotriene Receptor Antagonist Refill Protocol    Last visit in enc specialty: 01/29/2022     Recent Visits in This Encounter Department       Date Provider Department Visit Type Primary Dx    01/29/2022 Lorelee Cover, MD [place] [facility] Visit Type 2 diabetes mellitus with other specified complication, with long-term current use of insulin (CMS-HCC)    12/20/2021 Lorelee Cover, [place] Office Visit Type 2 diabetes mellitus without complication, with long-term current use of insulin (CMS-HCC)    09/04/2021 [name], MD [facility] Non-Face-to-Face Chronic pain syndrome    08/28/2021 [name], MD [facility] Visit Chronic cough    04/29/2021 [name], MD [facility] [place] Non-Face-to-Face Chronic pain syndrome           Population Health Visits  Recent [facility] Visits    None       Next  f/u appt due:  none   Next appt in enc specialty: 03/28/2022      Future Appointments 03/19/2022 - 03/18/2027        Date Visit Type Department Provider     03/28/2022 10:20 AM RETURN PRIMARY CARE PATIENT [facility] Lillie Fragmin, MD    Appointment Notes:     follow up             03/31/2022 10:40 AM [place] Lora Paula, MD    Appointment Notes:     2 month F/U             04/09/2022  8:30 AM RETURN [facility] [place] [facility] Eda Keys, MD    Appointment Notes:     4w             06/09/2022 12:40 PM EP RETURN [facility] CARDI[facility]ASC[facility]  Arrive at: [facility], 1st Floor, [place] Check In Desk [place], Emmaline Life, MD    Appointment Notes:     EP Ret Dx: Essential hypertension (Per MD Ok to [facility])                        LABS required:  (None)    Monitoring required:  (None)

## 2022-03-21 MED ORDER — MIRTAZAPINE 30 MG OR TABS
30.0000 mg | ORAL_TABLET | Freq: Every evening | ORAL | 1 refills | Status: AC
Start: 2022-03-21 — End: ?

## 2022-03-22 MED ORDER — PANTOPRAZOLE SODIUM 40 MG OR TBEC
40.0000 mg | DELAYED_RELEASE_TABLET | Freq: Every day | ORAL | 0 refills | Status: DC
Start: 2022-03-22 — End: 2022-07-15

## 2022-03-22 MED ORDER — MONTELUKAST SODIUM 10 MG OR TABS
10.0000 mg | ORAL_TABLET | Freq: Every evening | ORAL | 3 refills | Status: DC
Start: 2022-03-22 — End: 2022-10-08

## 2022-03-27 ENCOUNTER — Encounter (HOSPITAL_BASED_OUTPATIENT_CLINIC_OR_DEPARTMENT_OTHER): Payer: Medicaid Other | Admitting: Clinical Cardiac Electrophysiology

## 2022-03-27 DIAGNOSIS — Z95810 Presence of automatic (implantable) cardiac defibrillator: Secondary | ICD-10-CM

## 2022-03-27 DIAGNOSIS — Z4502 Encounter for adjustment and management of automatic implantable cardiac defibrillator: Secondary | ICD-10-CM

## 2022-03-27 MED ORDER — FOLIC ACID 1 MG OR TABS
1.0000 mg | ORAL_TABLET | Freq: Every day | ORAL | 1 refills | Status: DC
Start: 2022-03-27 — End: 2022-10-08

## 2022-03-28 ENCOUNTER — Encounter (INDEPENDENT_AMBULATORY_CARE_PROVIDER_SITE_OTHER): Payer: Self-pay | Admitting: Internal Medicine

## 2022-03-28 ENCOUNTER — Other Ambulatory Visit: Payer: BLUE CROSS/BLUE SHIELD | Attending: Internal Medicine | Admitting: Internal Medicine

## 2022-03-28 VITALS — BP 119/64 | HR 63 | Temp 97.3°F | Resp 18 | Wt 228.0 lb

## 2022-03-28 DIAGNOSIS — D869 Sarcoidosis, unspecified: Secondary | ICD-10-CM

## 2022-03-28 DIAGNOSIS — G894 Chronic pain syndrome: Secondary | ICD-10-CM

## 2022-03-28 DIAGNOSIS — E782 Mixed hyperlipidemia: Secondary | ICD-10-CM | POA: Insufficient documentation

## 2022-03-28 DIAGNOSIS — E1169 Type 2 diabetes mellitus with other specified complication: Secondary | ICD-10-CM | POA: Insufficient documentation

## 2022-03-28 DIAGNOSIS — R296 Repeated falls: Secondary | ICD-10-CM

## 2022-03-28 DIAGNOSIS — I10 Essential (primary) hypertension: Secondary | ICD-10-CM | POA: Insufficient documentation

## 2022-03-28 DIAGNOSIS — Z0000 Encounter for general adult medical examination without abnormal findings: Secondary | ICD-10-CM | POA: Insufficient documentation

## 2022-03-28 DIAGNOSIS — E1143 Type 2 diabetes mellitus with diabetic autonomic (poly)neuropathy: Secondary | ICD-10-CM | POA: Insufficient documentation

## 2022-03-28 DIAGNOSIS — J9611 Chronic respiratory failure with hypoxia: Secondary | ICD-10-CM

## 2022-03-28 DIAGNOSIS — M797 Fibromyalgia: Secondary | ICD-10-CM

## 2022-03-28 DIAGNOSIS — K219 Gastro-esophageal reflux disease without esophagitis: Secondary | ICD-10-CM

## 2022-03-28 DIAGNOSIS — Z794 Long term (current) use of insulin: Secondary | ICD-10-CM | POA: Insufficient documentation

## 2022-03-28 DIAGNOSIS — D500 Iron deficiency anemia secondary to blood loss (chronic): Secondary | ICD-10-CM

## 2022-03-28 LAB — CBC WITH DIFF, BLOOD
ANC-Automated: 2.8 10*3/uL (ref 1.6–7.0)
Abs Basophils: 0 10*3/uL (ref <0.2)
Abs Eosinophils: 0.1 10*3/uL (ref 0.0–0.5)
Abs Lymphs: 1.3 10*3/uL (ref 0.8–3.1)
Abs Monos: 0.6 10*3/uL (ref 0.2–0.8)
Basophils: 1 %
Eosinophils: 2 %
Hct: 35.7 % (ref 34.0–45.0)
Hgb: 10.3 gm/dL — ABNORMAL LOW (ref 11.2–15.7)
Lymphocytes: 27 %
MCH: 25.8 pg — ABNORMAL LOW (ref 26.0–32.0)
MCHC: 28.9 g/dL — ABNORMAL LOW (ref 32.0–36.0)
MCV: 89.3 um3 (ref 79.0–95.0)
MPV: 11.9 fL (ref 9.4–12.4)
Monocytes: 12 %
Plt Count: 208 10*3/uL (ref 140–370)
RBC: 4 10*6/uL (ref 3.90–5.20)
RDW: 14.6 % — ABNORMAL HIGH (ref 12.0–14.0)
Segs: 59 %
WBC: 4.8 10*3/uL (ref 4.0–10.0)

## 2022-03-28 LAB — LIPID(CHOL FRACT) PANEL, BLOOD
Cholesterol: 315 mg/dL — ABNORMAL HIGH (ref <200)
HDL-Cholesterol: 39 mg/dL
LDL-Chol (Calc): 236 mg/dL — ABNORMAL HIGH (ref <160)
Non-HDL Cholesterol: 276 mg/dL
Triglycerides: 200 mg/dL — ABNORMAL HIGH (ref 10–170)

## 2022-03-28 LAB — COMPREHENSIVE METABOLIC PANEL, BLOOD
ALT (SGPT): 17 U/L (ref 0–33)
AST (SGOT): 24 U/L (ref 0–32)
Albumin: 4 g/dL (ref 3.5–5.2)
Alkaline Phos: 116 U/L (ref 40–130)
Anion Gap: 6 mmol/L — ABNORMAL LOW (ref 7–15)
BUN: 16 mg/dL (ref 8–23)
Bicarbonate: 35 mmol/L — ABNORMAL HIGH (ref 22–29)
Bilirubin, Tot: 0.29 mg/dL (ref <1.2)
Calcium: 9.6 mg/dL (ref 8.5–10.6)
Chloride: 100 mmol/L (ref 98–107)
Creatinine: 0.78 mg/dL (ref 0.51–0.95)
Glucose: 176 mg/dL — ABNORMAL HIGH (ref 70–99)
Potassium: 4.6 mmol/L (ref 3.5–5.1)
Sodium: 141 mmol/L (ref 136–145)
Total Protein: 7.3 g/dL (ref 6.0–8.0)
eGFR Based on CKD-EPI 2021 Equation: >60 mL/min/{1.73_m2}

## 2022-03-28 LAB — TSH, BLOOD: TSH: 2.12 u[IU]/mL (ref 0.27–4.20)

## 2022-03-28 LAB — GLYCOSYLATED HGB(A1C), BLOOD: Glyco Hgb (A1C): 7.4 % — ABNORMAL HIGH (ref 4.8–5.8)

## 2022-03-28 LAB — VITAMIN B12, BLOOD: Vitamin B12: 387 pg/mL (ref 232–1245)

## 2022-03-28 MED ORDER — OXYCODONE-ACETAMINOPHEN 10-325 MG OR TABS
0.5000 | ORAL_TABLET | Freq: Four times a day (QID) | ORAL | 0 refills | Status: DC | PRN
Start: 2022-03-28 — End: 2022-05-26

## 2022-03-28 MED ORDER — METOPROLOL TARTRATE 50 MG OR TABS
50.0000 mg | ORAL_TABLET | Freq: Two times a day (BID) | ORAL | 3 refills | Status: AC
Start: 2022-03-28 — End: ?

## 2022-03-28 NOTE — Interdisciplinary (Signed)
Blood drawn from right arm with 23 gauge needle. 4 tubes taken.   Patient identity authenticated by Thersia Petraglia Lynn Story Conti, LVN.

## 2022-03-28 NOTE — Progress Notes (Signed)
Subjective:  Kimberly Curtis is a 67 year old female  who was evaluated by Lorelee Cover, MD presenting with:   Chief Complaint   Patient presents with    Follow Up     Problem list was updated and reviewed today with patient.    HPI:      1)  Sarcoidosis:  She has been doing much better for the last 30 days after she was started on Antibiotics. She has been wheezing less and having less SOB. She is oxygen dependent and has been evaluated by Pulmonary regularly. CT scan reviewed with her today.  She has not done the repeat sleep study yet. 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       2) Hypertensive Heart Disease:  She continues to have occasional elevated numbers at home and may need an optimization of the medications. She has been having pain constantly which aggravates the BP as well. She has been better than before but still uncontrolled. We will follow. She has been seen by Cardiology.      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 We wil increase her Lantus to 60 units and Humalog as per sliding scale. She will follow up with Endocrinologist. She has been skipping meals, we urged her to be more compliant with her diet and medication. We will follow.     4) Iron deficiency Anemia of chronic disease:  We rechecked her Hb and it is 12 mg/dl. We will continue to monitor. Stable condition. She needs to continue her medications. We will follow. Blood work ordered today.      5) Chronic Pain Syndrome:  She has continued to be in severe pain, she has not been taking the medications as often as she needs them to not overload her system. She is "always in pain".      6) Chronic Hypoxic Respiratory Failure:  She continues to be oxygen dependent 24 hours  a day at 3 LPM Nasal Canula.      7) Bilateral Leg Edema:  She has also been complaining of puffiness in hands and feet. She was taken off diuretic by Cardiology. We will reincorporate HCTZ as needed. We asked her not to take this daily. We need to be careful with her electrolytes as well.     Patient Active Problem List    Diagnosis Date Noted    Hypertensive heart disease without HF (heart failure) 01/29/2022    Chronic pain of both shoulders 08/28/2021    Bilateral hip pain 08/28/2021    Chronic cough 08/28/2021    Chronic hypoxemic respiratory failure (CMS-HCC) 02/25/2021    Fibromyalgia 10/30/2020    Anxiety 10/12/2020    Allergy, initial encounter 10/12/2020    S/P placement of cardiac pacemaker 10/12/2020    Vitamin D deficiency 10/12/2020    Sarcoidosis 07/09/2020    Chronic pain syndrome 07/09/2020    Recurrent falls 07/09/2020    Chronic pain of both knees 07/09/2020    Gastroesophageal reflux disease, unspecified whether esophagitis present 07/09/2020    Nausea 07/09/2020    Essential hypertension 07/09/2020    Itching 07/09/2020    Mixed hyperlipidemia 07/09/2020    Type 2 diabetes mellitus with diabetic autonomic neuropathy, with long-term  current use of insulin (CMS-HCC)        Medical/Family and Surgical History:    Past medical history and family history reviewed and updated today as below.      Social History     Socioeconomic History    Marital status: Married   Tobacco Use    Smoking status: Former    Smokeless tobacco: Never   Substance and Sexual Activity    Alcohol use: Not Currently    Drug use: Never    Past Surgical History:   Procedure Laterality Date    AICD pacemaker N/A 09/2018    CHOLECYSTECTOMY      HYSTERECTOMY      KNEE ARTHROSCOPY Left       Family History   Problem Relation Name Age of Onset    Diabetes Mother      Heart Disease Mother      Kidney Disease Mother      Hypertension Father      Alcohol/Drug Father        Past Medical History:   Diagnosis Date    Diabetes mellitus  (CMS-HCC)     Hypertension     Sarcoidosis of lung (CMS-HCC)     bilat    Tachycardia 09/2018        Health Maintenance:  Health Maintenance Due   Topic Date Due    Breast Cancer Screen  Never done    COVID-19 Vaccine (1) Never done    Shingles Vaccine (1 of 2) Never done    PHQ9 Depression Monitoring doc flowsheet  07/30/2021    Diabetes A1c  09/30/2021    LDL Monitoring  10/30/2021    Diabetic Retinal Exam  03/27/2022        Allergies and medications reviewed and updated as below.    Allergies   Allergen Reactions    Nsaids Nausea Only and Other     Per pt: Burning of stomach lining and nausea     Sulfa Drugs Anaphylaxis    Benadryl [Diphenhydramine] Rash and Itching     Per 12/25/20 TE: Do you have any additional known allergies to over the counter or prescription medications? Yes   If yes, please list additional medication(s) dye codeine on benadryl  and explain reaction(s)itching, rash     Latex Itching     Per 12/25/20 TE: Are you allergic to latex? Yes   If yes, please explain reaction(s)itchiness     Levaquin [Levofloxacin] Other     Hospitalized for "heart" problem    Macrolides Other     History of QT-C prolongation    Morphine Itching       Current Outpatient Medications   Medication Sig    albuterol (PROVENTIL) (2.5 MG/3ML) 0.083% nebulization 3 mL (2.5 mg) by Nebulization route every 4 hours as needed for Wheezing.    albuterol 108 (90 Base) MCG/ACT inhaler Inhale 2 puffs by mouth every 6 hours as needed for Wheezing.    Albuterol Sulfate 108 (90 Base) MCG/ACT AEPB Inhale 1 puff by mouth as needed.    ASPIRIN LOW DOSE 81 MG EC tablet TAKE 1 TABLET BY MOUTH EVERY DAY    atorvastatin (LIPITOR) 40 MG tablet Take 1 tablet (40 mg) by mouth daily.    controlled substance agreement controlled substance agreement    doxycycline (MONODOX) 100 MG capsule Take 1 capsule (100 mg) by mouth 2 times daily.    famotidine (PEPCID) 40 MG tablet Take 1 tablet (40 mg) by mouth daily.  fexofenadine (ALLEGRA) 180 MG tablet  Take 1 tablet (180 mg) by mouth daily.    fluticasone-umeclidinium-vilanterol (TRELEGY ELLIPTA) 100-62.5-25 MCG/INH AEPB Inhale 1 puff by mouth daily.    folic acid (FOLVITE) 1 MG tablet Take 1 tablet (1 mg) by mouth daily.    guaiFENesin-codeine (ROBITUSSIN AC) 100-10 MG/5ML oral solution Take 5 mL by mouth every 6 hours as needed for Cough.    hydroCHLOROthiazide (HYDRODIURIL) 25 MG tablet Take 1 tablet (25 mg) by mouth daily.    hydrOXYzine HCL (ATARAX) 50 MG tablet Take 1 tablet (50 mg) by mouth 2 times daily as needed for Itching. (Patient taking differently: Take 1 tablet (50 mg) by mouth 2 times daily as needed for Itching. hives)    Insulin Glargine Solostar (LANTUS SOLOSTAR) 100 UNIT/ML SOPN Inject 80 Units under the skin nightly.    insulin lispro, 1 Unit Dial, (HUMALOG KWIKPEN) 100 units/mL INJECT 20 UNITS UNDER THE SKIN 3 TIMES DAILY AS NEEDED FOR HIGH BLOOD SUGAR. IN THE MORNING 20 UNITS + SLIDING SCALE MID DAY: 17 + SLIDING SCALE AND IN THE EVENING 12 + SLIDING SCALE    Insulin Pen Needles (BD PEN NEEDLE NANO 2ND GEN) 32G X 4 MM MISC Inject 1 each under the skin 4 times daily. With each insulin administration    losartan (COZAAR) 50 MG tablet Take 1 tablet (50 mg) by mouth daily.    metoprolol tartrate (LOPRESSOR) 50 MG tablet Take 1 tablet (50 mg) by mouth 2 times daily.    mirtazapine (REMERON) 30 MG tablet Take 1 tablet (30 mg) by mouth nightly.    montelukast (SINGULAIR) 10 MG tablet Take 1 tablet (10 mg) by mouth every evening.    mupirocin (BACTROBAN) 2 % ointment APPLY 1 APPLICATION TOPICALLY 2 TIMES DAILY. USE A SMALL AMOUNT AS DIRECTED    naloxone (NARCAN) 4 mg/0.1 mL nasal spray Spray 1 spray into one nostril once as needed.    ondansetron (ZOFRAN ODT) 8 MG disintegrating tablet TAKE 1 TABLET ON OR UNDER THE TONGUE EVERY 8 HOURS AS NEEDED FOR NAUSEA VOMITING.    oxycodone-acetaminophen (PERCOCET) 10-325 MG tablet Take 0.5 tablets by mouth every 6 hours as needed for Severe Pain (Pain Score  7-10).    pantoprazole (PROTONIX) 40 MG tablet Take 1 tablet (40 mg) by mouth every morning (before breakfast).    pregabalin (LYRICA) 75 MG capsule '75mg'$  Morning and '150mg'$  nightly     No current facility-administered medications for this visit.       ROS:   Review of Systems   Constitutional:  Positive for decreased activity due to SOB, appetite change and fatigue. Negative for chills, fever and unexpected weight change.   HENT:  Positive for congestion, postnasal drip, Negative for tinnitus and trouble swallowing.    Eyes: Negative.    Respiratory:  Positive for cough, chest tightness and shortness of breath. Negative for choking, wheezing and stridor.    Cardiovascular:  Positive for chest pain, palpitations and leg swelling.   Genitourinary: Negative.    Musculoskeletal:  Positive for arthralgias (Both hands and back), back pain and joint swelling.        Gait instability present all the time.    Skin:  Negative at this time  Allergic/Immunologic: Positive for environmental allergies.   Neurological:  Positive for dizziness, weakness, light-headedness and headaches. Negative for seizures, syncope and speech difficulty. bjective:         Physical Exam  Constitutional:       Appearance: She  is obese, Oxygen Dependent.    HENT:      Head: Normocephalic and atraumatic.      Right Ear: Tympanic membrane and ear canal normal.      Left Ear: Tympanic membrane and ear canal normal.      Mouth/Throat:      Mouth: Mucous membranes are moist.      Pharynx: Oropharynx is clear.  No oropharyngeal exudate.   Eyes:      Extraocular Movements: Extraocular movements intact.      Conjunctiva/sclera: Conjunctivae normal.      Pupils: Pupils are equal, round, and reactive to light.   Cardiovascular:      Rate and Rhythm: Normal rate and regular rhythm.      Pulses:           Dorsalis pedis pulses are 2+ on the right side and 2+ on the left side.      Heart sounds: Normal heart sounds.   Pulmonary:      Effort: Pulmonary effort is  normal.      Breath sounds: Normal breath sounds.   Musculoskeletal:         General: Swelling and tenderness (both hands and Lower back.) present.      Cervical back: Normal range of motion and neck supple.      Right lower leg: No Edema present.      Left lower leg: No Edema present.      Right foot: Normal range of motion. No deformity, bunion or foot drop.      Left foot: Normal range of motion. No deformity, bunion or foot drop.   Skin:     General: Skin is warm.      Findings: No Rash present.   Neurological:      General: No focal deficit present.      Mental Status: She is alert and oriented to person, place, and time.   Psychiatric:         Mood and Affect: Mood normal.         Behavior: Behavior normal.      03/28/22  1006   BP: (!) 139/55   Pulse: 63   Temp: 97.3 F (36.3 C)   Resp: 18   SpO2: 100%     Body mass index is 37.94 kg/m.         Labs and chart reviewed.    Lab Results   Component Value Date    A1C 7.2 (H) 04/02/2021    A1C 7.3 (H) 10/30/2020    A1C 8.4 (H) 07/09/2020     Lab Results   Component Value Date    CHOL 217 10/30/2020    HDL 72 10/30/2020    LDLCALC 119 10/30/2020    TRIG 129 10/30/2020     Lab Results   Component Value Date    TSH 1.56 11/28/2021   CT Chest W/O Contrast  Order: [id number]  Status: Final result       Visible to patient: Yes (seen)       Next appt: 03/31/2022 at 10:40 AM in CARDIOLOGY Lora Paula, MD)       Dx: Sarcoidosis; Cough, unspecified type    0 Result Notes  Details    Reading Physician Reading Date Result Priority   Iva Lento, [place]  [phone] 03/09/2022      Narrative & Impression  EXAM DESCRIPTION:  CT CHEST W/O CONTRAST     CLINICAL HISTORY:  Shortness of breath,  sarcoidosis     TECHNIQUE:  Contiguous helical CT slices were obtained at 0.625 mm collimation through the thorax without intravenous contrast. Images were reformatted in the coronal and sagittal planes. Thick-slab maximum intensity projections were also created.      Radiation Dose:  Phantom: 32 cm body  Up-to-date CT equipment and radiation dose reduction techniques were employed. CTDIvol: 13.1 mGy. DLP: 421 mGy-cm.     COMPARISON:  09/25/20, 04/06/20 outside CT     FINDINGS:  LUNGS/AIRWAYS: Mild secretions in the lower trachea extending into the left mainstem bronchus. Mild basal distribution cylindrical bronchiectasis. Right upper lobe peribronchovascular nodule measuring 2.7 x 2.6 cm with regional architectural distortion and traction bronchiectasis, not significantly changed in overall dimensions. Increased satellited nodularity in the right upper lobe and new peribronchovascular distribution punctate nodularity in the right middle lobe. Stable nonspecific areas of cystic change in the basal left lower lobe and inferior lingula. Stable right apical 44m nodule. Stable partially calcified 12 mm nodule in the right upper lobe (S3, I147). Stable 1.9cm left lower lobe nodule (S3, I79)     PLEURA: No pleural effusion or pneumothorax.     CARDIOVASCULAR: Left chest wall dual chamber ICD. Aortic and mitral valve annulus calcifications. Minimal coronary artery calcifications. No significant pericardial effusion. Borderline caliber of the main pulmonary artery measuring 3.1 cm in diameter. Ascending aorta measures 3.8 cm in diameter. Mild atherosclerotic calcification of the thoracic aorta and arch vessels.     LYMPH NODES: No enlarged axillary lymph nodes. Enlarged lymph nodes throughout the mediastinum and bilateral hila which demonstrates scattered coarse calcifications not significantly changed in size compared to prior.     ABDOMEN: Calcified granulomas in the liver and spleen.     REGIONAL SKELETON AND SOFT TISSUES: No acute osseous findings.     DOSE STATEMENT:  [place] employ modern techniques for CT dose reduction, including protocol review, automatic exposure control, and iterative reconstruction techniques.  These features assure that  radiation dose levels in CT are optimized and are consistent with state-of-the-art, low dose CT practice.           Signed by: JRolly Salter12/17/2023 14:03:36  IMPRESSION:  IMPRESSION:  Increased and new peribronchovascular distribution punctate nodularity in the right upper lobe and right middle lobe concerning for worsening pulmonary sarcoidosis.     Long term stability of coalescent nodular opacity in the right upper lobe with regional traction bronchiectasis, likely chronic sequelae of sarcoidosis. Additional smaller solid bilateral pulmonary nodules are stable.     Stable calcified mediastinal and hilar lymphadenopathy in keep with lymph node manifestation of sarcoidosis.              Specimen Collected: 03/09/22 14:06 Last Resulted: 03/09/22 14:03             Assessment and Plan:      1)  Sarcoidosis:  She has been doing much better for the last 30 days after she was started on Antibiotics. She has been wheezing less and having less SOB. She is oxygen dependent and has been evaluated by Pulmonary regularly. CT scan reviewed with her today.  She has not done the repeat sleep study yet. 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       2) Hypertensive Heart Disease:  She continues to have occasional elevated numbers at home and may need an optimization of the medications. She has been having pain constantly which  aggravates the BP as well. She has been better than before but still uncontrolled. We will follow. She has been seen by Cardiology.      3)  Uncontrolled Type 2 Diabetes Mellitus with Hyperglycemia  with current [place] of insulin(CMS-HCC):  On Dexcom review she is having an average of BS 210 and HbA1c is 8.2  She has has Lantus 39 at night, and Humalog as per sliding scale. We wil increase her Lantus to 60 units and Humalog as per sliding scale. She will follow up with Endocrinologist. She has been skipping meals, we urged her to be more compliant with  her diet and medication. We will follow.     4) Iron deficiency Anemia of chronic disease:  We rechecked her Hb and it is 12 mg/dl. We will continue to monitor. Stable condition. She needs to continue her medications. We will follow. Blood work ordered today.      5) Chronic Pain Syndrome:  She has continued to be in severe pain, she has not been taking the medications as often as she needs them to not overload her system. She is "always in pain".      6) Chronic Hypoxic Respiratory Failure:  She continues to be oxygen dependent 24 hours a day at 3 LPM Nasal Canula.      7) Bilateral Leg Edema:  She has also been complaining of puffiness in hands and feet. She was taken off diuretic by Cardiology. We will reincorporate HCTZ as needed. We asked her not to take this daily. We need to be careful with her electrolytes as well.         Referrals:   As above    Return to Clinic: 3 months.      Patient Instructions: There are no Patient Instructions on file for this visit.   I conducted a 40-minute face-to-face encounter with the patient during which we discussed observed changes during the visit and reviewed medical records and studies.     Barriers to learning assessed: None.  Patient/family verbalizes understanding and is agreeable to above plan.  Medications were reconciled during this visit, and complex decision making was performed. Discussed diagnoses and plans of care. Discussed importance to take medications as directed, potential side effects, purpose and to immediately report to healthcare provider for any adverse effects.  Reviewed 911 or Emergency Department precautions, and when to seek further medical care for emergent conditions.  All questions and concerns answered to patient/family satisfaction.The plan was carefully reviewed verbally with the patient/family member/caregiver, and also affirmed that the patient/family member/caregiver understood next steps and follow up plan.    Reviewed verbally and AVS  available via MyChart for patient.      Lorelee Cover, MD   Signature Derived From Controlled Access [place], March 28, 2022, 10:21 AM.  [facility]

## 2022-03-31 ENCOUNTER — Ambulatory Visit (INDEPENDENT_AMBULATORY_CARE_PROVIDER_SITE_OTHER): Payer: Medicaid Other | Admitting: Heart Failure and Transplant Cardiology

## 2022-03-31 ENCOUNTER — Encounter (INDEPENDENT_AMBULATORY_CARE_PROVIDER_SITE_OTHER): Payer: Self-pay | Admitting: Heart Failure and Transplant Cardiology

## 2022-03-31 VITALS — BP 125/71 | HR 61 | Temp 98.1°F | Resp 16 | Ht 65.0 in | Wt 228.0 lb

## 2022-03-31 DIAGNOSIS — D8685 Sarcoid myocarditis: Secondary | ICD-10-CM

## 2022-03-31 NOTE — Patient Instructions (Signed)
Drop off the cardiac MRI and coronary angiogram images to our office and we will upload them.  Let me know if you would like [place] to order the coronary CT study.

## 2022-03-31 NOTE — Result Encounter Note (Signed)
Can we please add her on Thursday.     Thanks.

## 2022-03-31 NOTE — Progress Notes (Signed)
[facility]      Primary Care Physician:  Lorelee Cover  Referring Provider:   Morene Antu       ID. 67 year old female here for eval/management of poss cardiac sarcoidosis.    HPI:    Pt started on doxy by Dr. Cletis Media 03/03/22 for productive cough. CT chest with "increased and new peribronchovascular distribution punctate nodularity in the RUL and RML."    Chest pains in center of chest for past month. Perhaps related to cough. Sporadic. Can last up to a day. Not related to exertion. Not progressive.    Allergies:  Patient is allergic to nsaids, sulfa drugs, benadryl [diphenhydramine], latex, levaquin [levofloxacin], macrolides, and morphine.    Current Meds:  Current Outpatient Medications   Medication Sig    albuterol (PROVENTIL) (2.5 MG/3ML) 0.083% nebulization 3 mL (2.5 mg) by Nebulization route every 4 hours as needed for Wheezing.    albuterol 108 (90 Base) MCG/ACT inhaler Inhale 2 puffs by mouth every 6 hours as needed for Wheezing.    Albuterol Sulfate 108 (90 Base) MCG/ACT AEPB Inhale 1 puff by mouth as needed.    ASPIRIN LOW DOSE 81 MG EC tablet TAKE 1 TABLET BY MOUTH EVERY DAY    atorvastatin (LIPITOR) 40 MG tablet Take 1 tablet (40 mg) by mouth daily.    controlled substance agreement controlled substance agreement    doxycycline (MONODOX) 100 MG capsule Take 1 capsule (100 mg) by mouth 2 times daily.    famotidine (PEPCID) 40 MG tablet Take 1 tablet (40 mg) by mouth daily.    fexofenadine (ALLEGRA) 180 MG tablet Take 1 tablet (180 mg) by mouth daily.    fluticasone-umeclidinium-vilanterol (TRELEGY ELLIPTA) 100-62.5-25 MCG/INH AEPB Inhale 1 puff by mouth daily.    folic acid (FOLVITE) 1 MG tablet Take 1 tablet (1 mg) by mouth daily.    guaiFENesin-codeine (ROBITUSSIN AC) 100-10 MG/5ML oral solution Take 5 mL by mouth every 6 hours as needed for Cough.    hydroCHLOROthiazide (HYDRODIURIL) 25 MG tablet  Take 1 tablet (25 mg) by mouth daily.    hydrOXYzine HCL (ATARAX) 50 MG tablet Take 1 tablet (50 mg) by mouth 2 times daily as needed for Itching. (Patient taking differently: Take 1 tablet (50 mg) by mouth 2 times daily as needed for Itching. hives)    Insulin Glargine Solostar (LANTUS SOLOSTAR) 100 UNIT/ML SOPN Inject 80 Units under the skin nightly.    insulin lispro, 1 Unit Dial, (HUMALOG KWIKPEN) 100 units/mL INJECT 20 UNITS UNDER THE SKIN 3 TIMES DAILY AS NEEDED FOR HIGH BLOOD SUGAR. IN THE MORNING 20 UNITS + SLIDING SCALE MID DAY: 17 + SLIDING SCALE AND IN THE EVENING 12 + SLIDING SCALE    Insulin Pen Needles (BD PEN NEEDLE NANO 2ND GEN) 32G X 4 MM MISC Inject 1 each under the skin 4 times daily. With each insulin administration    losartan (COZAAR) 50 MG tablet Take 1 tablet (50 mg) by mouth daily.    metoprolol tartrate (LOPRESSOR) 50 MG tablet Take 1 tablet (50 mg) by mouth 2 times daily.    mirtazapine (REMERON) 30 MG tablet Take 1 tablet (30 mg) by mouth nightly.    montelukast (SINGULAIR) 10 MG tablet Take 1 tablet (10 mg) by mouth every evening.    mupirocin (BACTROBAN) 2 % ointment APPLY 1 APPLICATION TOPICALLY 2 TIMES DAILY. USE A SMALL AMOUNT AS DIRECTED  naloxone (NARCAN) 4 mg/0.1 mL nasal spray Spray 1 spray into one nostril once as needed.    ondansetron (ZOFRAN ODT) 8 MG disintegrating tablet TAKE 1 TABLET ON OR UNDER THE TONGUE EVERY 8 HOURS AS NEEDED FOR NAUSEA VOMITING.    oxycodone-acetaminophen (PERCOCET) 10-325 MG tablet Take 0.5 tablets by mouth every 6 hours as needed for Severe Pain (Pain Score 7-10).    pantoprazole (PROTONIX) 40 MG tablet Take 1 tablet (40 mg) by mouth every morning (before breakfast).    pregabalin (LYRICA) 75 MG capsule '75mg'$  Morning and '150mg'$  nightly     No current facility-administered medications for this visit.       Past Medical History:   Diagnosis Date    Diabetes mellitus (CMS-HCC)     Hypertension     Sarcoidosis of lung (CMS-HCC)     bilat    Tachycardia  09/2018     Patient Active Problem List   Diagnosis    Type 2 diabetes mellitus with diabetic autonomic neuropathy, with long-term current use of insulin (CMS-HCC)    Sarcoidosis    Chronic pain syndrome    Recurrent falls    Chronic pain of both knees    Gastroesophageal reflux disease, unspecified whether esophagitis present    Nausea    Essential hypertension    Itching    Mixed hyperlipidemia    Anxiety    Allergy, initial encounter    S/P placement of cardiac pacemaker    Vitamin D deficiency    Fibromyalgia    Chronic hypoxemic respiratory failure (CMS-HCC)    Chronic pain of both shoulders    Bilateral hip pain    Chronic cough    Hypertensive heart disease without HF (heart failure)    Iron deficiency anemia due to chronic blood loss    Type 2 diabetes mellitus with other specified complication, with long-term current use of insulin (CMS-HCC)     Social History     Socioeconomic History    Marital status: Married   Tobacco Use    Smoking status: Former    Smokeless tobacco: Never   Substance and Sexual Activity    Alcohol use: Not Currently    Drug use: Never     Family History   Problem Relation Name Age of Onset    Diabetes Mother      Heart Disease Mother      Kidney Disease Mother      Hypertension Father      Alcohol/Drug Father         Health Maintenance   Topic Date Due    Breast Cancer Screen  Never done    COVID-19 Vaccine (1) Never done    Shingles Vaccine (1 of 2) Never done    PHQ9 Depression Monitoring doc flowsheet  07/30/2021    Diabetic Retinal Exam  03/27/2022    Medicare Annual Wellness Visit  Never done    Pneumococcal Vaccine (1 - PCV) 01/12/2024 (Originally 09/20/1961)    Diabetes Urine Microalbumin  08/29/2022    Diabetes A1c  09/26/2022    Diabetic Foot Exam  12/21/2022    LDL Monitoring  03/29/2023    Diabetes CMP  03/29/2023    Colorectal Cancer Screening  04/15/2028    Tetanus (2 - Td or Tdap) 07/10/2030    Hepatitis C Screening  Completed    Universal HIV Screening  Completed     Polio Vaccine  Aged Out    IMM_Hep A Vaccine Series  Aged Out  HPV Vaccine <= 26 Yrs  Aged Out    Meningococcal MCV4 Vaccine  Aged Out    Influenza  Discontinued         Physical Exam:  BP 125/71 (BP Location: Left arm, BP Patient Position: Sitting, BP cuff size: Large)   Pulse 61   Temp 98.1 F (36.7 C) (Temporal)   Resp 16   Ht '5\' 5"'$  (1.651 m)   Wt 103.4 kg (227 lb 15.3 oz)   BMI 37.93 kg/m      GENERAL APPEARANCE: Well developed, well nourished, in no acute distress.   SKIN: No rashes, ulcerations or petechiae.   HEENT: Sclerae anicteric. Conjunctivae were pink and moist. Pupils equal, round.  Oropharynx clear without thrush.   NECK: No adenopathy, no thyromegaly. No distension noted.  CHEST: Symmetrical expansion with deep inspiration. No rales, rhonchi, or wheezes on anterior exam.  CARDIOVASCULAR: RRR. No murmurs. No S3. No S4. Distant heart sounds.  ABDOMEN: Soft and nontender with normal bowel sounds. No ascites was noted.   MUSCULOSKELETAL: Gait was normal.  Muscle strength and tone were normal.   EXTREMITIES: No cyanosis, clubbing. Tr BLE edema. Peripheral pulses were 2+ and symmetric. No painful erythema nodosum in the lower extremities. WWP.  NEUROLOGIC: Alert and oriented x 3. Grossly intact.  MOOD: Calm, pleasant.       Lab Data:  WBC 4.8 (01/05) HGB 10.3* (01/05) PLT 208 (01/05)    HCT 35.7 (01/05)         Na 141 (01/05) CL 100 (01/05) BUN 16 (01/05) GLU   176* (01/05)   K 4.6 (01/05) CO2 35* (01/05) Cr 0.78 (01/05)           Lab Results   Component Value Date    COLORUA Yellow 04/16/2021    APPEARUA Clear 04/16/2021    GLUCOSEUA Negative 04/16/2021    BILIUA Negative 04/16/2021    KETONEUA Negative 04/16/2021    SGUA >=1.030 04/16/2021    BLOODUA Negative 04/16/2021    PHUA 5.5 04/16/2021    PROTEINUA Trace (A) 04/16/2021    UROBILUA 0.2 04/16/2021    NITRITEUA Negative 04/16/2021    LEUKESTUA Negative 04/16/2021    WBCUA 6-10 (A) 05/13/2020    RBCUA 3-5 (A) 05/13/2020       Lab Results    Component Value Date    WBC 4.8 03/28/2022    RBC 4.00 03/28/2022    HGB 10.3 (L) 03/28/2022    HCT 35.7 03/28/2022    MCV 89.3 03/28/2022    MCHC 28.9 (L) 03/28/2022    RDW 14.6 (H) 03/28/2022    PLT 208 03/28/2022    MPV 11.9 03/28/2022     Lab Results   Component Value Date    BUN 16 03/28/2022    CREAT 0.78 03/28/2022    CL 100 03/28/2022    NA 141 03/28/2022    K 4.6 03/28/2022    [place] 9.6 03/28/2022    TBILI 0.29 03/28/2022    ALB 4.0 03/28/2022    TP 7.3 03/28/2022    AST 24 03/28/2022    ALK 116 03/28/2022    BICARB 35 (H) 03/28/2022    ALT 17 03/28/2022    GLU 176 (H) 03/28/2022            The 10-year ASCVD risk score (Arnett DK, et al., 2019) is: 29.4%    Values used to calculate the score:      Age: 84 years  Sex: Female      Is Non-Hispanic African American: Yes      Diabetic: Yes      Tobacco smoker: No      Systolic Blood Pressure: 542 mmHg      Is BP treated: Yes      HDL Cholesterol: 39 mg/dL      Total Cholesterol: 315 mg/dL    Lipids:    Lab Results   Component Value Date    CHOL 315 (H) 03/28/2022    HDL 39 03/28/2022    LDLCALC 236 (H) 03/28/2022    TRIG 200 (H) 03/28/2022       Diabetes screen:  Lab Results   Component Value Date    A1C 7.4 (H) 03/28/2022     Thyroid:    Lab Results   Component Value Date    TSH 2.12 03/28/2022     Assessment Summary:    Pt with chest pain likely due to chronic cough. Had recent [facility] 09/2020 that was negative. Pt and husband to obtain these images for my personal review. I did offer a CCTA but husband and pt declined given # of CTs performed in past year and c/f radiation exposure which is reasonable.    #. 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 On home O2 3LNC  #. 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 LVEF 50-55%. Invasive coronary angiogram neg 09/2020 per charting.  #. 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  #. HTN  #. HLD  #. Morbid obesity  #. DM2, insulin dependent  #. OSA    - pt and  husband to rerequest CMR and LHC images on CD and will drop off these images to the clinic once obtained.  - should the chest pain persist/worsen, pt/husband are welcome to contact the clinic or our RN Larene Beach for ordering a CCTA for further coronary eval for chest pain. At this time, it's less likely to be the cause for the pain and the productive cough from bronchitis is more likely the reason given the recent negative LHC.  - no new cardiac meds at this time.  - Drs. Hoffmayer and Cletis Media cc'd to this clinic note.      No orders of the defined types were placed in this encounter.    Patient Instructions   Drop off the cardiac MRI and coronary angiogram images to our office and we will upload them.  Let me know if you would like [place] to order the coronary CT study.  Return in about 6 months (around 09/29/2022).    Future Appointments   Date Time Provider [place]   01/29/2022 10:30 AM [facility] P[facility] ROOM [facility] Lab Exec. [id number]   01/29/2022 11:00 AM Eda Keys, MD [facility] Pulm Sle Exec. [phone]   01/29/2022  1:00 PM Lorelee Cover, MD EAS [facility]   02/07/2022 10:00 AM Derwood Kaplan, MD [facility] Arthriti [facility]   02/24/2022  9:40 AM Lora Paula, MD [facility] CARDVASC [place]   06/09/2022 12:40 PM Hoffmayer, Emmaline Life, MD [facility] CARDVASC SCV       Problem List Items Addressed This Visit    None          I spent 45 minutes face to face with the patient. Greater than 50% of the visit was spent discussing their heart condition, details as above in assessment and plan, all questions answered.    Cristal Ford, MD,  MAS  Assistant Clinical Professor  Advanced HF, MCS, Heart-Transplant  Cardiac MRI and CT  Departments of Medicine and Radiology    Office #: [phone]  E-mail: [email or web address]  Pager: [phone], [phone]

## 2022-04-03 ENCOUNTER — Encounter (INDEPENDENT_AMBULATORY_CARE_PROVIDER_SITE_OTHER): Payer: Self-pay | Admitting: Hospital

## 2022-04-03 ENCOUNTER — Encounter (INDEPENDENT_AMBULATORY_CARE_PROVIDER_SITE_OTHER): Payer: Self-pay | Admitting: Internal Medicine

## 2022-04-03 ENCOUNTER — Telehealth (INDEPENDENT_AMBULATORY_CARE_PROVIDER_SITE_OTHER): Payer: BLUE CROSS/BLUE SHIELD | Admitting: Internal Medicine

## 2022-04-03 DIAGNOSIS — J4489 Other specified chronic obstructive pulmonary disease: Secondary | ICD-10-CM

## 2022-04-03 DIAGNOSIS — E782 Mixed hyperlipidemia: Secondary | ICD-10-CM

## 2022-04-03 DIAGNOSIS — D500 Iron deficiency anemia secondary to blood loss (chronic): Secondary | ICD-10-CM

## 2022-04-03 DIAGNOSIS — G588 Other specified mononeuropathies: Secondary | ICD-10-CM | POA: Insufficient documentation

## 2022-04-03 DIAGNOSIS — E1169 Type 2 diabetes mellitus with other specified complication: Secondary | ICD-10-CM

## 2022-04-03 DIAGNOSIS — E1143 Type 2 diabetes mellitus with diabetic autonomic (poly)neuropathy: Secondary | ICD-10-CM

## 2022-04-03 DIAGNOSIS — Z794 Long term (current) use of insulin: Secondary | ICD-10-CM

## 2022-04-03 DIAGNOSIS — M5416 Radiculopathy, lumbar region: Secondary | ICD-10-CM | POA: Insufficient documentation

## 2022-04-03 DIAGNOSIS — I119 Hypertensive heart disease without heart failure: Secondary | ICD-10-CM

## 2022-04-03 MED ORDER — FERROUS SULFATE 325 (65 FE) MG OR TABS
325.0000 mg | ORAL_TABLET | Freq: Every day | ORAL | 3 refills | Status: AC
Start: 2022-04-03 — End: ?

## 2022-04-03 NOTE — Progress Notes (Addendum)
Patient Verification and Telemedicine Consent:    I have confirmed the accuracy and validity of the patient's identification: Yes  The patient, and/or their surrogate, has been informed that today's evaluation will be conducted using a home telemedicine visit method, which includes audio, video, and digital data transmission: Yes  The patient, and/or their surrogate, has been informed of their right to refuse this form of evaluation at any time during the assessment period and has been informed of any alternatives available for evaluation: Yes  The patient, and/or their surrogate, has been informed that they may require further assessments in the future: Yes  The patient, and/or their surrogate, has signed a valid Informed Consent document that outlines the associated risks, benefits, alternatives, and costs, or they meet the criteria for exemption from these requirements as mandated by applicable law. I confirm that this document is currently on file in the [facility]    Subjective:  Kimberly Curtis is a 66 year old female  who was evaluated by Lorelee Cover, MD presenting with:   Chief Complaint   Patient presents with    Follow up Results     And pain      Problem list was updated and reviewed today with patient.    HPI:     1)  Type 2 diabetes mellitus with diabetic autonomic neuropathy, with long-term current use of insulin (CMS-HCC)  Her HbA1c was 7.4% which is the same that it was on the last sample.  She will continue improving her diet at this time. We will follow    2) Hypertensive heart disease without HF (heart failure)  Her BP levels have been controlled. Continued to improve diet and exercise at this time.     3) Mixed Hyperlipidemia due to DM:  On review from 03/28/2022 Trigs 200, TC 315, HDL 39, LDL 236.  She has not been taking her medications due to the antibiotics that have been prescribed for her lung infection. We will restart ASAP and follow up.     4) Iron deficiency  Anemia:  Her Hb continues to be around 10/4 since September. She has not been taking any Iron supplementations and we will restart this today. We will follow this in 2-3 months. Stable condition.     5) COPD:  Stable at this time. Continue with Chronic O2 supplementation and follow up with pulmonary as well. We will follow. Stable condition.       Patient Active Problem List    Diagnosis Date Noted    Iron deficiency anemia due to chronic blood loss 03/28/2022    Type 2 diabetes mellitus with other specified complication, with long-term current use of insulin (CMS-HCC) 03/28/2022    Hypertensive heart disease without HF (heart failure) 01/29/2022    Chronic pain of both shoulders 08/28/2021    Bilateral hip pain 08/28/2021    Chronic cough 08/28/2021    Chronic hypoxemic respiratory failure (CMS-HCC) 02/25/2021    Fibromyalgia 10/30/2020    Anxiety 10/12/2020    Allergy, initial encounter 10/12/2020    S/P placement of cardiac pacemaker 10/12/2020    Vitamin D deficiency 10/12/2020    Sarcoidosis 07/09/2020    Chronic pain syndrome 07/09/2020    Recurrent falls 07/09/2020    Chronic pain of both knees 07/09/2020    Gastroesophageal reflux disease, unspecified whether esophagitis present 07/09/2020    Nausea 07/09/2020    Essential hypertension 07/09/2020    Itching 07/09/2020    Mixed hyperlipidemia 07/09/2020  Type 2 diabetes mellitus with diabetic autonomic neuropathy, with long-term current use of insulin (CMS-HCC)        Medical/Family and Surgical History:    Past medical history and family history reviewed and updated today as below.      Social History     Socioeconomic History    Marital status: Married   Tobacco Use    Smoking status: Former    Smokeless tobacco: Never   Substance and Sexual Activity    Alcohol use: Not Currently    Drug use: Never    Past Surgical History:   Procedure Laterality Date    AICD pacemaker N/A 09/2018    CHOLECYSTECTOMY      HYSTERECTOMY      KNEE ARTHROSCOPY Left        Family History   Problem Relation Name Age of Onset    Diabetes Mother      Heart Disease Mother      Kidney Disease Mother      Hypertension Father      Alcohol/Drug Father        Past Medical History:   Diagnosis Date    Diabetes mellitus (CMS-HCC)     Hypertension     Sarcoidosis of lung (CMS-HCC)     bilat    Tachycardia 09/2018           Health Maintenance:  Health Maintenance Due   Topic Date Due    Breast Cancer Screen  Never done    COVID-19 Vaccine (1) Never done    Shingles Vaccine (1 of 2) Never done    Diabetic Retinal Exam  03/27/2022    Medicare Annual Wellness Visit  Never done    PHQ2 depression screen  04/01/2022        Allergies and medications reviewed and updated as below.    Allergies   Allergen Reactions    Nsaids Nausea Only and Other     Per pt: Burning of stomach lining and nausea     Sulfa Drugs Anaphylaxis    Benadryl [Diphenhydramine] Rash and Itching     Per 12/25/20 TE: Do you have any additional known allergies to over the counter or prescription medications? Yes   If yes, please list additional medication(s) dye codeine on benadryl  and explain reaction(s)itching, rash     Latex Itching     Per 12/25/20 TE: Are you allergic to latex? Yes   If yes, please explain reaction(s)itchiness     Levaquin [Levofloxacin] Other     Hospitalized for "heart" problem    Macrolides Other     History of QT-C prolongation    Morphine Itching       Current Outpatient Medications   Medication Sig    albuterol (PROVENTIL) (2.5 MG/3ML) 0.083% nebulization 3 mL (2.5 mg) by Nebulization route every 4 hours as needed for Wheezing.    albuterol 108 (90 Base) MCG/ACT inhaler Inhale 2 puffs by mouth every 6 hours as needed for Wheezing.    Albuterol Sulfate 108 (90 Base) MCG/ACT AEPB Inhale 1 puff by mouth as needed.    ASPIRIN LOW DOSE 81 MG EC tablet TAKE 1 TABLET BY MOUTH EVERY DAY    atorvastatin (LIPITOR) 40 MG tablet Take 1 tablet (40 mg) by mouth daily.    controlled substance agreement controlled  substance agreement    doxycycline (MONODOX) 100 MG capsule Take 1 capsule (100 mg) by mouth 2 times daily.    famotidine (PEPCID) 40 MG tablet Take  1 tablet (40 mg) by mouth daily.    fexofenadine (ALLEGRA) 180 MG tablet Take 1 tablet (180 mg) by mouth daily.    fluticasone-umeclidinium-vilanterol (TRELEGY ELLIPTA) 100-62.5-25 MCG/INH AEPB Inhale 1 puff by mouth daily.    folic acid (FOLVITE) 1 MG tablet Take 1 tablet (1 mg) by mouth daily.    guaiFENesin-codeine (ROBITUSSIN AC) 100-10 MG/5ML oral solution Take 5 mL by mouth every 6 hours as needed for Cough.    hydroCHLOROthiazide (HYDRODIURIL) 25 MG tablet Take 1 tablet (25 mg) by mouth daily.    hydrOXYzine HCL (ATARAX) 50 MG tablet Take 1 tablet (50 mg) by mouth 2 times daily as needed for Itching. (Patient taking differently: Take 1 tablet (50 mg) by mouth 2 times daily as needed for Itching. hives)    Insulin Glargine Solostar (LANTUS SOLOSTAR) 100 UNIT/ML SOPN Inject 80 Units under the skin nightly.    insulin lispro, 1 Unit Dial, (HUMALOG KWIKPEN) 100 units/mL INJECT 20 UNITS UNDER THE SKIN 3 TIMES DAILY AS NEEDED FOR HIGH BLOOD SUGAR. IN THE MORNING 20 UNITS + SLIDING SCALE MID DAY: 17 + SLIDING SCALE AND IN THE EVENING 12 + SLIDING SCALE    Insulin Pen Needles (BD PEN NEEDLE NANO 2ND GEN) 32G X 4 MM MISC Inject 1 each under the skin 4 times daily. With each insulin administration    losartan (COZAAR) 50 MG tablet Take 1 tablet (50 mg) by mouth daily.    metoprolol tartrate (LOPRESSOR) 50 MG tablet Take 1 tablet (50 mg) by mouth 2 times daily.    mirtazapine (REMERON) 30 MG tablet Take 1 tablet (30 mg) by mouth nightly.    montelukast (SINGULAIR) 10 MG tablet Take 1 tablet (10 mg) by mouth every evening.    mupirocin (BACTROBAN) 2 % ointment APPLY 1 APPLICATION TOPICALLY 2 TIMES DAILY. USE A SMALL AMOUNT AS DIRECTED    naloxone (NARCAN) 4 mg/0.1 mL nasal spray Spray 1 spray into one nostril once as needed.    ondansetron (ZOFRAN ODT) 8 MG disintegrating  tablet TAKE 1 TABLET ON OR UNDER THE TONGUE EVERY 8 HOURS AS NEEDED FOR NAUSEA VOMITING.    oxycodone-acetaminophen (PERCOCET) 10-325 MG tablet Take 0.5 tablets by mouth every 6 hours as needed for Severe Pain (Pain Score 7-10).    pantoprazole (PROTONIX) 40 MG tablet Take 1 tablet (40 mg) by mouth every morning (before breakfast).    pregabalin (LYRICA) 75 MG capsule '75mg'$  Morning and '150mg'$  nightly     No current facility-administered medications for this visit.       ROS:       Full ros as per EPIC and reviewed with patient.      Objective: There were no vitals taken for this visit.     GEN: Appears to be in no apparent distress, breathing comfortably  Eyes: anicteric   HENT: Normocephalic, atraumatic in appearance    Pulm: Breathing pattern appears normal, no increased work of breathing appreciated visually   Neuro: Alert, awake, mild tremor   Psych: Non-pressured speech   Skin: No jaundice appreciated   Musculoskeletal: Normal muscle bulk per visual exam      Labs and chart reviewed.    Lab Results   Component Value Date    A1C 7.4 (H) 03/28/2022    A1C 7.2 (H) 04/02/2021    A1C 7.3 (H) 10/30/2020     Lab Results   Component Value Date    CHOL 315 (H) 03/28/2022    HDL 39 03/28/2022  [place] 236 (H) 03/28/2022    TRIG 200 (H) 03/28/2022     Lab Results   Component Value Date    TSH 2.12 03/28/2022       Assessment and Plan:     1)  Type 2 diabetes mellitus with diabetic autonomic neuropathy, with long-term current use of insulin (CMS-HCC)  Her HbA1c was 7.4% which is the same that it was on the last sample.  She will continue improving her diet at this time. We will follow    2) Hypertensive heart disease without HF (heart failure)  Her BP levels have been controlled. Continued to improve diet and exercise at this time.     3) Mixed Hyperlipidemia due to DM:  On review from 03/28/2022 Trigs 200, TC 315, HDL 39, LDL 236.  She has not been taking her medications due to the antibiotics that have been prescribed  for her lung infection. We will restart ASAP and follow up.     4) Iron deficiency Anemia:  Her Hb continues to be around 10/4 since September. She has not been taking any Iron supplementations and we will restart this today. We will follow this in 2-3 months. Stable condition.     5) COPD:  Stable at this time. Continue with Chronic O2 supplementation and follow up with pulmonary as well. We will follow. Stable condition.     Orders Places: There are no diagnoses linked to this encounter.   Referrals:   No referrals found.      Return to Clinic: No follow-ups on file.     Patient Instructions: There are no Patient Instructions on file for this visit.       Barriers to learning assessed: None.  Patient/family verbalizes understanding and is agreeable to above plan.  Medications were reconciled during this visit, and complex decision making was performed. Discussed diagnoses and plans of care. Discussed importance to take medications as directed, potential side effects, purpose and to immediately report to healthcare provider for any adverse effects.  Reviewed 911 or Emergency Department precautions, and when to seek further medical care for emergent conditions.  All questions and concerns answered to patient/family satisfaction.The plan was carefully reviewed verbally with the patient/family member/caregiver, and also affirmed that the patient/family member/caregiver understood next steps and follow up plan.    Reviewed verbally and AVS available via MyChart for patient.      Lorelee Cover, MD   Signature Derived From Controlled Access Password, April 03, 2022, 2:26 PM.  [facility]

## 2022-04-04 ENCOUNTER — Encounter (INDEPENDENT_AMBULATORY_CARE_PROVIDER_SITE_OTHER): Payer: Self-pay

## 2022-04-09 ENCOUNTER — Encounter (INDEPENDENT_AMBULATORY_CARE_PROVIDER_SITE_OTHER): Payer: Medicaid Other | Admitting: Pulmonary Medicine

## 2022-04-21 ENCOUNTER — Other Ambulatory Visit (INDEPENDENT_AMBULATORY_CARE_PROVIDER_SITE_OTHER): Payer: Self-pay

## 2022-04-21 ENCOUNTER — Encounter (INDEPENDENT_AMBULATORY_CARE_PROVIDER_SITE_OTHER): Payer: Self-pay | Admitting: Internal Medicine

## 2022-04-21 ENCOUNTER — Other Ambulatory Visit (INDEPENDENT_AMBULATORY_CARE_PROVIDER_SITE_OTHER): Payer: Self-pay | Admitting: Internal Medicine

## 2022-04-21 DIAGNOSIS — I10 Essential (primary) hypertension: Secondary | ICD-10-CM

## 2022-04-21 DIAGNOSIS — G894 Chronic pain syndrome: Secondary | ICD-10-CM

## 2022-04-21 DIAGNOSIS — E119 Type 2 diabetes mellitus without complications: Secondary | ICD-10-CM

## 2022-04-21 NOTE — Telephone Encounter (Signed)
[facility] Patient Outreach     Kimberly Curtis is a 67 year old female who I have contacted regarding a referral for the Diabetes & Nutrition Education Program.    Telephonic call placed to inform of eligibility for services, but was unable to reach patient.     Voicemail left with instructions to call back scheduler if interested in services at: [phone]

## 2022-04-21 NOTE — Telephone Encounter (Signed)
[facility]       Controlled substances (Refill and PA requests) are not currently included in Pharmacy Refill Clinic protocol. Re-routing to appropriate staff for processing. Thank you.

## 2022-04-22 MED ORDER — LOSARTAN POTASSIUM 50 MG OR TABS
50.0000 mg | ORAL_TABLET | Freq: Every day | ORAL | 0 refills | Status: DC
Start: 2022-04-22 — End: 2022-07-23

## 2022-04-22 MED ORDER — PREGABALIN 75 MG OR CAPS
ORAL_CAPSULE | ORAL | 0 refills | Status: DC
Start: 2022-04-22 — End: 2022-05-29

## 2022-04-22 MED ORDER — BD PEN NEEDLE NANO 2ND GEN 32G X 4 MM MISC
1.0000 | Freq: Four times a day (QID) | 3 refills | Status: DC
Start: 2022-04-22 — End: 2022-07-10

## 2022-04-22 NOTE — Telephone Encounter (Signed)
Refills pended in separate encounter.    - Cat RN, Ambulatory Float Team

## 2022-04-22 NOTE — Telephone Encounter (Signed)
Pharmacy updated per patient. [facility].    Patient also requesting refills for losartan and insulin pen needles. Pended orders here.    Patient has one more month supply of losartan left. Sent [employer] to confirm if refill is needed.    - Cat RN, Ambulatory Float Team

## 2022-05-10 ENCOUNTER — Encounter (INDEPENDENT_AMBULATORY_CARE_PROVIDER_SITE_OTHER): Payer: Self-pay | Admitting: Internal Medicine

## 2022-05-10 ENCOUNTER — Other Ambulatory Visit (INDEPENDENT_AMBULATORY_CARE_PROVIDER_SITE_OTHER): Payer: Self-pay | Admitting: Internal Medicine

## 2022-05-10 DIAGNOSIS — E119 Type 2 diabetes mellitus without complications: Secondary | ICD-10-CM

## 2022-05-11 NOTE — Telephone Encounter (Signed)
[facility]     Insulin Glargine (Lantus SoloStar) is not currently included in the Pharmacy Refill Clinic protocols. Re-routing to the responsible staff for processing. Thank you!

## 2022-05-13 MED ORDER — LANTUS SOLOSTAR 100 UNIT/ML SC SOLN
PEN_INJECTOR | SUBCUTANEOUS | 0 refills | Status: DC
Start: 2022-05-13 — End: 2022-05-26

## 2022-05-13 NOTE — Telephone Encounter (Signed)
From: Gennifer Maser  To: Lorelee Cover, MD  Sent: 05/10/2022 11:23 PM PST  Subject: Completely out of Lantus     Can you please send a refill prescription on my Lantus I am completely out of my Lantus so I need you to send this prescription in as soon as possible please thank you so much at [place] [facility] [place]

## 2022-05-19 ENCOUNTER — Encounter (INDEPENDENT_AMBULATORY_CARE_PROVIDER_SITE_OTHER): Payer: Self-pay | Admitting: Hospital

## 2022-05-23 ENCOUNTER — Encounter (INDEPENDENT_AMBULATORY_CARE_PROVIDER_SITE_OTHER): Payer: Self-pay | Admitting: Internal Medicine

## 2022-05-23 DIAGNOSIS — G894 Chronic pain syndrome: Secondary | ICD-10-CM

## 2022-05-23 DIAGNOSIS — M797 Fibromyalgia: Secondary | ICD-10-CM

## 2022-05-23 DIAGNOSIS — E119 Type 2 diabetes mellitus without complications: Secondary | ICD-10-CM

## 2022-05-26 ENCOUNTER — Other Ambulatory Visit (INDEPENDENT_AMBULATORY_CARE_PROVIDER_SITE_OTHER): Payer: Self-pay | Admitting: Internal Medicine

## 2022-05-26 DIAGNOSIS — E119 Type 2 diabetes mellitus without complications: Secondary | ICD-10-CM

## 2022-05-26 NOTE — Telephone Encounter (Signed)
For your review

## 2022-05-26 NOTE — Telephone Encounter (Signed)
[facility]       Controlled substances (Refill and PA requests) & Lantus are not currently included in Pharmacy Refill Clinic protocol. Re-routing to appropriate staff for processing. Thank you.

## 2022-05-26 NOTE — Telephone Encounter (Signed)
From: Francee Senna  To: Lorelee Cover, MD  Sent: 05/23/2022 12:43 PM PST  Subject: Medication refills     I need a refill on my Oxycodone 10-325 and I also will be needing another refill on my Lantus very soon I only have two pens left I am taking 80 units a night can you please send those refills to [facility] at [address] , [place] please thank you so much I truly appreciate it

## 2022-05-26 NOTE — Telephone Encounter (Signed)
Established with: Kimberly Curtis   Last OV with PCP: 04/03/2022   Next OV with PCP: Visit date not found     Requested Medication(s):  Requested Prescriptions     Pending Prescriptions Disp Refills    oxycodone-acetaminophen (PERCOCET) 10-325 MG tablet 30 tablet 0     Sig: Take 0.5 tablets by mouth every 6 hours as needed for Severe Pain (Pain Score 7-10).    LANTUS SOLOSTAR 100 UNIT/ML SOPN 15 mL 0       Send to:   [facility] [place]  [place] [address]  Phone: [phone] Fax: [phone]       Last labs:   Lab Results   Component Value Date    CHOL 315 (H) 03/28/2022    HDL 39 03/28/2022    LDLCALC 236 (H) 03/28/2022    TRIG 200 (H) 03/28/2022    TSH 2.12 03/28/2022    A1C 7.4 (H) 03/28/2022        Blood Pressure   03/31/22 125/71   03/28/22 119/64   03/03/22 138/79        Health Maintenance Due   Topic Date Due    Breast Cancer Screen  Never done    COVID-19 Vaccine (1) Never done    Shingles Vaccine (1 of 2) Never done    Diabetic Retinal Exam  03/27/2022    Medicare Annual Wellness Visit  Never done

## 2022-05-27 ENCOUNTER — Other Ambulatory Visit (INDEPENDENT_AMBULATORY_CARE_PROVIDER_SITE_OTHER): Payer: Self-pay | Admitting: Internal Medicine

## 2022-05-27 DIAGNOSIS — Z794 Long term (current) use of insulin: Secondary | ICD-10-CM

## 2022-05-27 MED ORDER — LANTUS SOLOSTAR 100 UNIT/ML SC SOLN
80.0000 [IU] | PEN_INJECTOR | Freq: Every day | SUBCUTANEOUS | 0 refills | Status: DC
Start: 2022-05-27 — End: 2022-07-15

## 2022-05-27 MED ORDER — OXYCODONE-ACETAMINOPHEN 10-325 MG OR TABS
0.5000 | ORAL_TABLET | Freq: Four times a day (QID) | ORAL | 0 refills | Status: DC | PRN
Start: 2022-05-27 — End: 2022-07-31

## 2022-05-27 MED ORDER — LANTUS SOLOSTAR 100 UNIT/ML SC SOLN
PEN_INJECTOR | SUBCUTANEOUS | 0 refills | Status: AC
Start: 2022-05-27 — End: ?

## 2022-05-27 NOTE — Telephone Encounter (Signed)
[place]  is not currently included in the Pharmacy Refill Clinic protocols. Re-routing to the responsible staff for processing.  Thank you.

## 2022-05-28 ENCOUNTER — Other Ambulatory Visit (INDEPENDENT_AMBULATORY_CARE_PROVIDER_SITE_OTHER): Payer: Self-pay | Admitting: Internal Medicine

## 2022-05-28 DIAGNOSIS — G894 Chronic pain syndrome: Secondary | ICD-10-CM

## 2022-05-28 NOTE — Telephone Encounter (Signed)
[place]  is not currently included in the Pharmacy Refill Clinic protocols. Re-routing to the responsible staff for processing.  Thank you.

## 2022-05-29 MED ORDER — PREGABALIN 75 MG OR CAPS
ORAL_CAPSULE | ORAL | 0 refills | Status: DC
Start: 2022-05-29 — End: 2022-07-14

## 2022-05-29 NOTE — Telephone Encounter (Signed)
[facility]       Controlled substances (Refill and PA requests) are not currently included in Pharmacy Refill Clinic protocol. Re-routing to appropriate staff for processing. Thank you.

## 2022-06-03 ENCOUNTER — Telehealth (INDEPENDENT_AMBULATORY_CARE_PROVIDER_SITE_OTHER): Payer: Self-pay

## 2022-06-03 NOTE — Telephone Encounter (Signed)
Called patient and left voicemail regarding upcoming appointment. Per patient's insurance we are not contracted. Advised patient to call back.     Patient can keep appointment and pay out of pocket.      If patient will like to continue services, we can reschedule appointment and request a peer to peer for authorization.     Patient can cancel and reach out to her PCP to refer to a in network provider.

## 2022-06-04 ENCOUNTER — Telehealth (INDEPENDENT_AMBULATORY_CARE_PROVIDER_SITE_OTHER): Payer: Self-pay | Admitting: Pulmonary Medicine

## 2022-06-04 NOTE — Telephone Encounter (Signed)
-----   Message from Leonette Monarch sent at 06/03/2022  3:27 PM PDT -----  Regarding: Denial Script  Kimberly Curtis, Kimberly Curtis MR# [id number] is not financially cleared for a RETURN ALD VISIT which was scheduled for 06/09/22 and Kimberly Curtis is at risk for non-payment. The health plan has denied this request due to Kimberly Curtis. PT REDIRECTED TO DR. Felicita Gage SORTO [phone]. If you would like to appeal the decision please call NOT APPLICABLE. The health plan will accept appeals from either the provider and/or patient. I have LEFT MESSAGE FOR PATIENT. Please consider cancelling the appointment until appeal if applicable has been completed.

## 2022-06-04 NOTE — Telephone Encounter (Signed)
Called patient and lvm in regard to denied auth for visit with Dr. Cletis Media on 06/09/2022.  Relayed per authorization team message that patient is out of network with [facility] and is being referred to be seen with a Dr. Charlotta Newton.  Provided contact phone number to Dr. Nathanial Millman office.  Patient's appointment for 06/09/2022 will be cancelled.  Thank you

## 2022-06-09 ENCOUNTER — Encounter (INDEPENDENT_AMBULATORY_CARE_PROVIDER_SITE_OTHER): Payer: Medicaid Other | Admitting: Pulmonary Medicine

## 2022-06-09 ENCOUNTER — Ambulatory Visit: Payer: BLUE CROSS/BLUE SHIELD | Admitting: Clinical Cardiac Electrophysiology

## 2022-06-30 ENCOUNTER — Encounter (INDEPENDENT_AMBULATORY_CARE_PROVIDER_SITE_OTHER): Payer: 141 | Admitting: Clinical Cardiac Electrophysiology

## 2022-06-30 DIAGNOSIS — Z4502 Encounter for adjustment and management of automatic implantable cardiac defibrillator: Secondary | ICD-10-CM

## 2022-06-30 DIAGNOSIS — Z95810 Presence of automatic (implantable) cardiac defibrillator: Secondary | ICD-10-CM

## 2022-07-01 ENCOUNTER — Telehealth: Payer: Self-pay | Admitting: Pharmacist

## 2022-07-01 NOTE — Telephone Encounter (Signed)
Comprehensive Medication Review - Primary Care     Contacted patient via MyChart to schedule a CMR with Eric Dehner, PharmD.     Sent MyChart message to patient. Provided contact number for [facility] MTM program & requested patient to call back if they wish to schedule appointment.     Attempt: 1    Judy Bracamontes, CPhT  Pharmacy Technician III (MTM)  [facility]  Email: [email or web address]  Phone: [phone]

## 2022-07-08 NOTE — Telephone Encounter (Signed)
Comprehensive Medication Review - Primary Care     Contacted patient via MyChart to schedule a CMR with Eric Dehner, PharmD.      Sent MyChart message to patient. Provided contact number for [facility] MTM program & requested patient to call back if they wish to schedule appointment.     Attempt: 2     Judy Bracamontes, CPhT  Pharmacy Technician III (MTM)  [facility]  Email: [email or web address]  Phone: [phone]

## 2022-07-10 ENCOUNTER — Other Ambulatory Visit (INDEPENDENT_AMBULATORY_CARE_PROVIDER_SITE_OTHER): Payer: Self-pay | Admitting: Internal Medicine

## 2022-07-10 DIAGNOSIS — E119 Type 2 diabetes mellitus without complications: Secondary | ICD-10-CM

## 2022-07-10 DIAGNOSIS — G894 Chronic pain syndrome: Secondary | ICD-10-CM

## 2022-07-10 MED ORDER — BD PEN NEEDLE NANO 2ND GEN 32G X 4 MM MISC
1.0000 | Freq: Four times a day (QID) | 2 refills | Status: DC
Start: 2022-07-10 — End: 2022-10-08

## 2022-07-10 NOTE — Telephone Encounter (Signed)
Pharmacy change request              Leafy Half, CPhT  (Rx Refill and PA Clinic)      Diabetic Supplies Refill Protocol    Last visit in enc specialty: 04/03/2022     Recent Visits in This Encounter Department       Date Provider Department Visit Type Primary Dx    04/03/2022 Suzan Nailer, MD [place] [facility] Telemedicine Type 2 diabetes mellitus with other specified complication, with long-term current use of insulin (CMS-HCC)    03/28/2022 Suzan Nailer, MD [facility] Office Visit Type 2 diabetes mellitus with other specified complication, with long-term current use of insulin (CMS-HCC)    01/29/2022 Suzan Nailer, MD [facility] Office Visit Type 2 diabetes mellitus with other specified complication, with long-term current use of insulin (CMS-HCC)    12/20/2021 Suzan Nailer, MD [facility] Office Visit Type 2 diabetes mellitus without complication, with long-term current use of insulin (CMS-HCC)    09/04/2021 Gillian Scarce, MD, MPH [facility] Non-Face-to-Face Chronic pain syndrome           Population Health Visits  Recent [facility] Visits    None       Next appt in enc specialty: Visit date not found      Future Appointments 07/10/2022 - 07/09/2027        Date Visit Type Department Provider     09/29/2022 10:40 AM CARDIO RETURN ENC CARDIOVASCULAR Verdene Lennert, MD    Appointment Notes:     2 month F/Uleft VM for confirmation. AF.             04/27/2023  2:00 PM EP RETURN SCV CARDIOVASCULAR  Arrive at: [facility], 1st Floor, [place] Check In Desk Hoffmayer, Everlene Balls, MD    Appointment Notes:     EP Ret Dx: Essential hypertension (Per MD Ok to [facility])                        **No recent notes or labs reviewed per pharmacy dept protocol**    Pt requires insulin?  yes

## 2022-07-10 NOTE — Telephone Encounter (Signed)
Incoming call from pharmacy rep name: michelle  requesting refill  Patient is out of medication       Established with: Diaz-Gonzalez, Thermon Leyland     Last OV in department: 04/03/2022   Next OV in department: Visit date not found    Requested Medication(s):  Requested Prescriptions     Pending Prescriptions Disp Refills    Insulin Pen Needles (BD PEN NEEDLE NANO 2ND GEN) 32G X 4 MM MISC 400 each 3     Sig: Inject 1 each under the skin 4 times daily. With each insulin administration     Allergies   Allergen Reactions    Nsaids Nausea Only and Other     Per pt: Burning of stomach lining and nausea     Sulfa Drugs Anaphylaxis    Benadryl [Diphenhydramine] Rash and Itching     Per 12/25/20 TE: Do you have any additional known allergies to over the counter or prescription medications? Yes   If yes, please list additional medication(s) dye codeine on benadryl  and explain reaction(s)itching, rash     Latex Itching     Per 12/25/20 TE: Are you allergic to latex? Yes   If yes, please explain reaction(s)itchiness     Levaquin [Levofloxacin] Other     Hospitalized for "heart" problem    Macrolides Other     History of QT-C prolongation    Morphine Itching        Send to:         [facility] [address], [place] - [phone]  [address] [place] [address]  Phone: [phone] Fax: [phone]       Last labs:   Lab Results   Component Value Date    CHOL 315 (H) 03/28/2022    HDL 39 03/28/2022    LDLCALC 236 (H) 03/28/2022    TRIG 200 (H) 03/28/2022    TSH 2.12 03/28/2022    A1C 7.4 (H) 03/28/2022      Blood Pressure   03/31/22 125/71   03/28/22 119/64   03/03/22 138/79      Health Maintenance Due   Topic Date Due    Breast Cancer Screen  Never done    Shingles Vaccine (1 of 2) Never done    COVID-19 Vaccine (1 - 2023-24 season) Never done    Diabetic Retinal Exam  03/27/2022    Medicare Annual Wellness Visit  Never done    Diabetes Urine Microalbumin  08/29/2022        Current Medication(s):   Current  Outpatient Medications   Medication Sig Dispense Refill    albuterol (PROVENTIL) (2.5 MG/3ML) 0.083% nebulization 3 mL (2.5 mg) by Nebulization route every 4 hours as needed for Wheezing. 1 each 2    albuterol 108 (90 Base) MCG/ACT inhaler Inhale 2 puffs by mouth every 6 hours as needed for Wheezing. 1 each 0    Albuterol Sulfate 108 (90 Base) MCG/ACT AEPB Inhale 1 puff by mouth as needed.      ASPIRIN LOW DOSE 81 MG EC tablet TAKE 1 TABLET BY MOUTH EVERY DAY 120 tablet 1    atorvastatin (LIPITOR) 40 MG tablet Take 1 tablet (40 mg) by mouth daily. 90 tablet 1    controlled substance agreement controlled substance agreement 1 each 0    doxycycline (MONODOX) 100 MG capsule Take 1 capsule (100 mg) by mouth 2 times daily. 60 capsule 3    famotidine (PEPCID) 40 MG tablet Take 1  tablet (40 mg) by mouth daily. 90 tablet 1    ferrous sulfate 325 (65 Fe) MG tablet Take 1 tablet (325 mg) by mouth daily. 90 tablet 3    fexofenadine (ALLEGRA) 180 MG tablet Take 1 tablet (180 mg) by mouth daily. 90 tablet 1    fluticasone-umeclidinium-vilanterol (TRELEGY ELLIPTA) 100-62.5-25 MCG/INH AEPB Inhale 1 puff by mouth daily.      folic acid (FOLVITE) 1 MG tablet Take 1 tablet (1 mg) by mouth daily. 90 tablet 1    guaiFENesin-codeine (ROBITUSSIN AC) 100-10 MG/5ML oral solution Take 5 mL by mouth every 6 hours as needed for Cough. 120 mL 0    hydroCHLOROthiazide (HYDRODIURIL) 25 MG tablet Take 1 tablet (25 mg) by mouth daily. 90 tablet 3    hydrOXYzine HCL (ATARAX) 50 MG tablet Take 1 tablet (50 mg) by mouth 2 times daily as needed for Itching. (Patient taking differently: Take 1 tablet (50 mg) by mouth 2 times daily as needed for Itching. hives) 180 tablet 3    insulin lispro, 1 Unit Dial, (HUMALOG KWIKPEN) 100 units/mL INJECT 20 UNITS UNDER THE SKIN 3 TIMES DAILY AS NEEDED FOR HIGH BLOOD SUGAR. IN THE MORNING 20 UNITS + SLIDING SCALE MID DAY: 17 + SLIDING SCALE AND IN THE EVENING 12 + SLIDING SCALE 15 mL 11    Insulin Pen Needles (BD  PEN NEEDLE NANO 2ND GEN) 32G X 4 MM MISC Inject 1 each under the skin 4 times daily. With each insulin administration 400 each 3    LANTUS SOLOSTAR 100 UNIT/ML SOPN INJECT 80 UNITS SUBCUTANEOUSLY ONCE DAILY AT  NIGHT 15 mL 0    LANTUS SOLOSTAR 100 UNIT/ML SOPN Apply 80 Units topically daily. 15 mL 0    LANTUS SOLOSTAR 100 UNIT/ML SOPN INJECT 80 UNITS SUBCUTANEOUSLY AT NIGHT 15 mL 0    losartan (COZAAR) 50 MG tablet Take 1 tablet (50 mg) by mouth daily. 90 tablet 0    metoprolol tartrate (LOPRESSOR) 50 MG tablet Take 1 tablet (50 mg) by mouth 2 times daily. 180 tablet 3    mirtazapine (REMERON) 30 MG tablet Take 1 tablet (30 mg) by mouth nightly. 90 tablet 1    montelukast (SINGULAIR) 10 MG tablet Take 1 tablet (10 mg) by mouth every evening. 90 tablet 3    mupirocin (BACTROBAN) 2 % ointment APPLY 1 APPLICATION TOPICALLY 2 TIMES DAILY. USE A SMALL AMOUNT AS DIRECTED 22 g 1    naloxone (NARCAN) 4 mg/0.1 mL nasal spray Spray 1 spray into one nostril once as needed.      ondansetron (ZOFRAN ODT) 8 MG disintegrating tablet TAKE 1 TABLET ON OR UNDER THE TONGUE EVERY 8 HOURS AS NEEDED FOR NAUSEA VOMITING. 30 tablet 0    oxycodone-acetaminophen (PERCOCET) 10-325 MG tablet Take 0.5 tablets by mouth every 6 hours as needed for Severe Pain (Pain Score 7-10). 30 tablet 0    pantoprazole (PROTONIX) 40 MG tablet Take 1 tablet (40 mg) by mouth every morning (before breakfast). 90 tablet 0    pregabalin (LYRICA) 75 MG capsule TAKE 1 CAPSULE BY MOUTH THREE TIMES DAILY 90 capsule 0     No current facility-administered medications for this visit.        Encounter created by Care Assist MA.  If further action required please route encounter to appropriate in clinic MA/LVN/Resident pool.

## 2022-07-11 NOTE — Telephone Encounter (Signed)
[facility]       Controlled substances (Refill and PA requests) are not currently included in Pharmacy Refill Clinic protocol. Re-routing to appropriate staff for processing. Thank you.

## 2022-07-14 MED ORDER — PREGABALIN 75 MG OR CAPS
ORAL_CAPSULE | ORAL | 0 refills | Status: DC
Start: 2022-07-14 — End: 2022-08-07

## 2022-07-15 ENCOUNTER — Other Ambulatory Visit (INDEPENDENT_AMBULATORY_CARE_PROVIDER_SITE_OTHER): Payer: Self-pay | Admitting: Internal Medicine

## 2022-07-15 DIAGNOSIS — E119 Type 2 diabetes mellitus without complications: Secondary | ICD-10-CM

## 2022-07-15 DIAGNOSIS — K219 Gastro-esophageal reflux disease without esophagitis: Secondary | ICD-10-CM

## 2022-07-15 MED ORDER — PANTOPRAZOLE SODIUM 40 MG OR TBEC
40.0000 mg | DELAYED_RELEASE_TABLET | Freq: Every day | ORAL | 2 refills | Status: DC
Start: 2022-07-15 — End: 2022-10-08

## 2022-07-15 NOTE — Telephone Encounter (Signed)
*  The requested med passed all protocol parameters in the associated med info guide - Protocol details reviewed by pharmacist.       Proton Pump Inhibitor Refill Protocol    Last visit in enc specialty: 04/03/2022     Recent Visits in This Encounter Department       Date Provider Department Visit Type Primary Dx    04/03/2022 Suzan Nailer, MD [place] [facility] Telemedicine Type 2 diabetes mellitus with other specified complication, with long-term current use of insulin (CMS-HCC)    03/28/2022 Suzan Nailer, MD [facility] Office Visit Type 2 diabetes mellitus with other specified complication, with long-term current use of insulin (CMS-HCC)    01/29/2022 Suzan Nailer, MD [facility] [place] Office Visit Type 2 diabetes mellitus with other specified complication, with long-term current use of insulin (CMS-HCC)    12/20/2021 Suzan Nailer, MD [facility] Office Visit Type 2 diabetes mellitus without complication, with long-term current use of insulin (CMS-HCC)    09/04/2021 Gillian Scarce, MD, MPH [facility] Non-Face-to-Face Chronic pain syndrome           Population Health Visits  Recent [facility] Visits    None       Next appt in enc specialty: Visit date not found      Future Appointments 07/15/2022 - 07/14/2027        Date Visit Type Department Provider     09/29/2022 10:40 AM CARDIO RETURN ENC CARDIOVASCULAR Verdene Lennert, MD    Appointment Notes:     2 month F/Uleft VM for confirmation. AF.             04/27/2023  2:00 PM EP RETURN SCV CARDIOVASCULAR  Arrive at: [facility], 1st Floor, [place] Check In Desk Hoffmayer, Everlene Balls, MD    Appointment Notes:     EP Ret Dx: Essential hypertension (Per MD Ok to [facility])                        LABS required:  (None)    Monitoring required:  (None)

## 2022-07-15 NOTE — Telephone Encounter (Addendum)
[facility]     Lantus is not currently included in the Pharmacy Refill Clinic protocols. Re-routing to the responsible staff for processing.  Thank you  ---------------------------------------------------------------------------  The following meds were approved per protocol:    Medications ordered in this Encounter   Medications    pantoprazole (PROTONIX) 40 MG tablet     Sig: Take 1 tablet (40 mg) by mouth every morning (before breakfast).     Dispense:  90 tablet     Refill:  2

## 2022-07-16 ENCOUNTER — Encounter (INDEPENDENT_AMBULATORY_CARE_PROVIDER_SITE_OTHER): Payer: Self-pay | Admitting: Internal Medicine

## 2022-07-16 MED ORDER — LANTUS SOLOSTAR 100 UNIT/ML SC SOLN
80.0000 [IU] | PEN_INJECTOR | Freq: Every day | SUBCUTANEOUS | 0 refills | Status: DC
Start: 2022-07-16 — End: 2022-08-07

## 2022-07-16 NOTE — Telephone Encounter (Signed)
Requested medication:  Requested Prescriptions     Pending Prescriptions Disp Refills    LANTUS SOLOSTAR 100 UNIT/ML SOPN 15 mL 0     Sig: Apply 80 Units topically daily.     Signed Prescriptions Disp Refills    pantoprazole (PROTONIX) 40 MG tablet 90 tablet 2     Sig: Take 1 tablet (40 mg) by mouth every morning (before breakfast).     Authorizing Provider: Suzan Nailer     Ordering User: SMALL, DANIEL       Last filled: 05/27/22   QTY: 15ml  Refills authorized: No     Last Office Visit with PCP Dr, Suzan Nailer: 04/03/2022  Next Office Visit with PCP Dr, Deeann Cree, Thermon Leyland: Visit date not found

## 2022-07-20 ENCOUNTER — Other Ambulatory Visit (INDEPENDENT_AMBULATORY_CARE_PROVIDER_SITE_OTHER): Payer: Self-pay | Admitting: Internal Medicine

## 2022-07-20 DIAGNOSIS — I10 Essential (primary) hypertension: Secondary | ICD-10-CM

## 2022-07-21 NOTE — Telephone Encounter (Addendum)
*The requested med passed all protocol parameters in the associated med info guide - Protocol details reviewed by pharmacist.       Angiotensin II Receptor Blocker (ARB) Refill Protocol    Last visit in enc specialty: 04/03/2022     Recent Visits in This Encounter Department       Date Provider Department Visit Type Primary Dx    04/03/2022 Suzan Nailer, MD [place] [facility] Telemedicine Type 2 diabetes mellitus with other specified complication, with long-term current use of insulin (CMS-HCC)    03/28/2022 Suzan Nailer, MD [place] [facility] Office Visit Type 2 diabetes mellitus with other specified complication, with long-term current use of insulin (CMS-HCC)    01/29/2022 Suzan Nailer, MD [place] [facility] Office Visit Type 2 diabetes mellitus with other specified complication, with long-term current use of insulin (CMS-HCC)    12/20/2021 Suzan Nailer, MD [place] [facility] [place] Office Visit Type 2 diabetes mellitus without complication, with long-term current use of insulin (CMS-HCC)    09/04/2021 Gillian Scarce, MD, MPH [place] [facility] Non-Face-to-Face Chronic pain syndrome           Population Health Visits  Recent [facility] Visits    None       Next appt in enc specialty: 08/07/2022      Future Appointments 07/21/2022 - 07/20/2027        Date Visit Type Department Provider     08/07/2022 10:40 AM RETURN PRIMARY EXTEND VIDEO [place] [facility] Beckie Busing, MD    Appointment Notes:     Follow up             09/29/2022 10:40 AM CARDIO RETURN ENC CARDIOVASCULAR Verdene Lennert, MD    Appointment Notes:     2 month F/Uleft VM for confirmation. AF.             04/27/2023  2:00 PM EP RETURN SCV CARDIOVASCULAR  Arrive at: [facility], 1st Floor, [place] Check In Desk Hoffmayer, Everlene Balls, MD    Appointment Notes:     EP Ret Dx: Essential hypertension (Per MD Ok to [facility])                        LABS  required:  (Q year Potassium, Creatinine)    Lab Results   Component Value Date    NA 141 03/28/2022    K 4.6 03/28/2022    CL 100 03/28/2022    BICARB 35 (H) 03/28/2022    BUN 16 03/28/2022    CREAT 0.78 03/28/2022       Lab Results   Component Value Date    GFRNON >60 11/21/2020    EGFRCKDEPI >60 03/28/2022         Monitoring required:  (Q year BP) (pregnancy prn)  *If pt is pregnant, REFER TO MD*    (BP range: Systolic=90-150  ZOXWRUEAV=40-98)  Blood Pressure   03/31/22 125/71   03/28/22 119/64   03/03/22 138/79       Pulse Readings from Last 3 Encounters:   03/31/22 61   03/28/22 63   03/03/22 85         Last Digital Health Monitoring Vitals:        Last MyChart BP Values:        Last Pt Entered MyChart BP Values:       -----------------------------------------------------------------------------------------------------------    The above technician note was evaluated by the pharmacist.  Pharmacist Assessment and Plan:    04/03/22 visit:  2) Hypertensive heart disease without HF (heart failure)  Her BP levels have been controlled. Continued to improve diet and exercise at this time.        Quenten Raven, PharmD, [facility]  [facility]   Refill and Prior Auth Clinical Services  Phone:  [phone]  Ext:  [phone]

## 2022-07-23 NOTE — Telephone Encounter (Signed)
Comprehensive Medication Review - Primary Care     Contacted patient via telephone to schedule a CMR with Eric Dehner, PharmD.     Was unable to reach patient and left voice mail on home phone number. Provided contact number for [facility] MTM program & requested patient to call back if they wish to schedule appointment.     Attempt: 3 (Final)     Outcomes MTM will be updated as 'Unable to reach patient'      Judy Bracamontes, CPhT  Pharmacy Technician III (MTM)  [facility]  Email: [email or web address]  Phone: [phone]

## 2022-07-30 ENCOUNTER — Encounter (INDEPENDENT_AMBULATORY_CARE_PROVIDER_SITE_OTHER): Payer: Self-pay | Admitting: Internal Medicine

## 2022-07-30 DIAGNOSIS — G894 Chronic pain syndrome: Secondary | ICD-10-CM

## 2022-07-30 DIAGNOSIS — M797 Fibromyalgia: Secondary | ICD-10-CM

## 2022-07-31 MED ORDER — OXYCODONE-ACETAMINOPHEN 10-325 MG OR TABS
0.5000 | ORAL_TABLET | Freq: Four times a day (QID) | ORAL | 0 refills | Status: DC | PRN
Start: 2022-07-31 — End: 2022-10-08

## 2022-07-31 NOTE — Telephone Encounter (Signed)
Patient is requesting refill via MyChart  For Opioids:     Medication requested Requested Prescriptions     Pending Prescriptions Disp Refills    oxycodone-acetaminophen (PERCOCET) 10-325 MG tablet 30 tablet 0     Sig: Take 0.5 tablets by mouth every 6 hours as needed for Severe Pain (Pain Score 7-10).         Last Filled Date 05/27/22   Quantity Last Filled  30   Last Labs  Lab Results   Component Value Date    AMPCLASS Negative 10/30/2021    BARBCLASS Negative 10/30/2021    BENZYLCGNN Negative 10/30/2021    METHADONE Negative 10/30/2021    OPIATESCL Negative 10/30/2021    OXY Negative 10/30/2021    PHENCYCLDN Negative 10/30/2021    TETCANNABIN Negative 10/30/2021       For opioids,date of last urine toxicology screen within one year?  Yes, urine drug screen on file dated: 10/30/21     Visit within 3 months?  Has not been seen in over 3 months.  Routing to physician as Lorain Childes.     Narcotic contract in letter section or media section signed:  Yes, on file.    CURES report within 4 months in EPIC?  Yes, CURES report on file found within 4 months. Please referr to encounter/Media date 03/28/2022     Is the pharmacy correct?  [facility] [address] [place], [place] - [address]  [address]  [place] [address]  Phone: [phone] Fax: [phone]          Is the prescription type correct?  yes -      Established with: Diaz-GonzalezThermon Leyland   Future Appointments   Date Time Provider Department Center   08/07/2022 10:40 AM Diaz-Gonzalez, Thermon Leyland, MD E[facility] [place]   09/29/2022 10:40 AM Verdene Lennert, MD ENC CARDVASC [place]   04/27/2023  2:00 PM Hoffmayer, Everlene Balls, MD SCV CARDVASC SCV     Last office visit: 04/03/2022  Next office visit: 08/07/2022    Please delete prior to routing:   If sending refill to cross covering MD, please use "Prescribe" unless instructed otherwise per physician instructions.   If sending refill to cross covering MD, please tee up only minimum number until next visit and until  narcotic contract/utox is obtained.

## 2022-07-31 NOTE — Telephone Encounter (Signed)
From: Kimberly Curtis  To: Thermon Leyland Diaz-Gonzalez  Sent: 07/30/2022 7:13 PM PDT  Subject: Refill prescription     I hoping you can refill my prescription of the Oxycodone Acetaminophen 10/325 for me please I truly appreciate it thank you very much

## 2022-08-01 ENCOUNTER — Encounter (INDEPENDENT_AMBULATORY_CARE_PROVIDER_SITE_OTHER): Payer: Self-pay | Admitting: Internal Medicine

## 2022-08-01 NOTE — Telephone Encounter (Signed)
A user error has taken place: encounter opened in error, closed for administrative reasons.

## 2022-08-07 ENCOUNTER — Telehealth (INDEPENDENT_AMBULATORY_CARE_PROVIDER_SITE_OTHER): Payer: BLUE CROSS/BLUE SHIELD | Admitting: Internal Medicine

## 2022-08-07 DIAGNOSIS — R110 Nausea: Secondary | ICD-10-CM

## 2022-08-07 DIAGNOSIS — Z1231 Encounter for screening mammogram for malignant neoplasm of breast: Secondary | ICD-10-CM

## 2022-08-07 DIAGNOSIS — I10 Essential (primary) hypertension: Secondary | ICD-10-CM

## 2022-08-07 DIAGNOSIS — R0602 Shortness of breath: Secondary | ICD-10-CM

## 2022-08-07 DIAGNOSIS — E1169 Type 2 diabetes mellitus with other specified complication: Secondary | ICD-10-CM

## 2022-08-07 DIAGNOSIS — Z794 Long term (current) use of insulin: Secondary | ICD-10-CM

## 2022-08-07 DIAGNOSIS — E782 Mixed hyperlipidemia: Secondary | ICD-10-CM

## 2022-08-07 DIAGNOSIS — E119 Type 2 diabetes mellitus without complications: Secondary | ICD-10-CM

## 2022-08-07 DIAGNOSIS — D869 Sarcoidosis, unspecified: Secondary | ICD-10-CM

## 2022-08-07 DIAGNOSIS — G894 Chronic pain syndrome: Secondary | ICD-10-CM

## 2022-08-07 MED ORDER — ALBUTEROL SULFATE 108 (90 BASE) MCG/ACT IN AERS
2.0000 | INHALATION_SPRAY | Freq: Four times a day (QID) | RESPIRATORY_TRACT | 4 refills | Status: AC | PRN
Start: 2022-08-07 — End: ?

## 2022-08-07 MED ORDER — ONDANSETRON 8 MG OR TBDP
8.0000 mg | ORAL_TABLET | Freq: Three times a day (TID) | ORAL | 3 refills | Status: AC | PRN
Start: 2022-08-07 — End: ?

## 2022-08-07 MED ORDER — LANTUS SOLOSTAR 100 UNIT/ML SC SOLN
80.0000 [IU] | PEN_INJECTOR | Freq: Every day | SUBCUTANEOUS | 11 refills | Status: AC
Start: 2022-08-07 — End: ?

## 2022-08-07 MED ORDER — ALBUTEROL SULFATE (2.5 MG/3ML) 0.083% IN NEBU
2.5000 mg | INHALATION_SOLUTION | RESPIRATORY_TRACT | 3 refills | Status: AC | PRN
Start: 2022-08-07 — End: ?

## 2022-08-07 MED ORDER — PREGABALIN 75 MG OR CAPS
ORAL_CAPSULE | ORAL | 0 refills | Status: DC
Start: 2022-08-07 — End: 2022-10-08

## 2022-08-07 NOTE — Progress Notes (Signed)
Patient Verification and Telemedicine Consent:    I have confirmed the accuracy and validity of the patient's identification: Yes  The patient, and/or their surrogate, has been informed that today's evaluation will be conducted using a home telemedicine visit method, which includes audio, video, and digital data transmission: Yes  The patient, and/or their surrogate, has been informed of their right to refuse this form of evaluation at any time during the assessment period and has been informed of any alternatives available for evaluation: Yes  The patient, and/or their surrogate, has been informed that they may require further assessments in the future: Yes  The patient, and/or their surrogate, has signed a valid Informed Consent document that outlines the associated risks, benefits, alternatives, and costs, or they meet the criteria for exemption from these requirements as mandated by applicable law. I confirm that this document is currently on file in the [id number]    Subjective:  Kimberly Curtis is a 67 year old female  who was evaluated by Suzan Nailer, MD presenting with:   Chief Complaint   Patient presents with    Follow Up     Problem list was updated and reviewed today with patient.    HPI:   We have not seen her in a few months. She has had several deaths in her family. But now she is ready to get back on track.     1) Type 2 diabetes mellitus with diabetic autonomic neuropathy, with long-term current use of insulin (CMS-HCC)  Her glycemias have been controlled numbers. HbA1c was 7.4% which is the same that it was on the last sample.  She will continue improving her diet at this time. We need to recheck her levels ASAP. will follow     2) Hypertensive heart disease without HF (heart failure)  Her BP levels have been controlled. Continued to improve diet and exercise at this time.   We will follow.      3) Mixed Hyperlipidemia due to DM:  On review from 03/28/2022 Trigs 200, TC  315, HDL 39, LDL 236.  She has not been taking her medications due to the antibiotics that have been prescribed for her lung infection. We will restart ASAP and follow up.      4) Iron deficiency Anemia:  Her Hb continues to be around 10/4 since September. She has not been taking any Iron supplementations and we will restart this today. We will follow this in 2-3 months. Stable condition.      5) COPD:  Stable at this time. She has continued with Chronic O2 supplementation and follow up with pulmonary as well.   We will follow. Stable condition.     6) Sarcoidosis:  She has not been seen by a Lung specialist since last year. Her insurance changed. So she needs to needs a new Cardiologist and Pulmonary specialist. She will be sent for referrals for the above.   We will follow.     7) Chronic Pain Syndrome:  She has been having continued pain, mostly on her shoulders. She has been taking her pain medication for this.       Patient Active Problem List    Diagnosis Date Noted    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) 04/03/2022    Iron deficiency anemia due to chronic blood loss 03/28/2022    Mixed hyperlipidemia due to type 2 diabetes mellitus (CMS-HCC) 03/28/2022    Hypertensive heart disease without HF (heart failure)  01/29/2022    Chronic pain of both shoulders 08/28/2021    Bilateral hip pain 08/28/2021    Chronic cough 08/28/2021    Chronic hypoxemic respiratory failure (CMS-HCC) 02/25/2021    Fibromyalgia 10/30/2020    Anxiety 10/12/2020    Allergy, initial encounter 10/12/2020    S/P placement of cardiac pacemaker 10/12/2020    Vitamin D deficiency 10/12/2020    Sarcoidosis 07/09/2020    Chronic pain syndrome 07/09/2020    Recurrent falls 07/09/2020    Chronic pain of both knees 07/09/2020    Gastroesophageal reflux disease, unspecified whether esophagitis present 07/09/2020    Nausea 07/09/2020    Essential hypertension 07/09/2020    Itching 07/09/2020    Type 2 diabetes  mellitus with diabetic autonomic neuropathy, with long-term current use of insulin (CMS-HCC)        Medical/Family and Surgical History:    Past medical history and family history reviewed and updated today as below.      Social History     Socioeconomic History    Marital status: Married   Tobacco Use    Smoking status: Former    Smokeless tobacco: Never   Substance and Sexual Activity    Alcohol use: Not Currently    Drug use: Never    Past Surgical History:   Procedure Laterality Date    AICD pacemaker N/A 09/2018    CHOLECYSTECTOMY      HYSTERECTOMY      KNEE ARTHROSCOPY Left       Family History   Problem Relation Name Age of Onset    Diabetes Mother      Heart Disease Mother      Kidney Disease Mother      Hypertension Father      Alcohol/Drug Father        Past Medical History:   Diagnosis Date    Diabetes mellitus (CMS-HCC)     Hypertension     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    Allergies   Allergen Reactions    Nsaids Nausea Only and Other     Per pt: Burning of stomach lining and nausea     Sulfa Drugs Anaphylaxis    Benadryl [Diphenhydramine] Rash and Itching     Per 12/25/20 TE: Do you have any additional known allergies to over the counter or prescription medications? Yes   If yes, please list additional medication(s) dye codeine on benadryl  and explain reaction(s)itching, rash     Latex Itching     Per 12/25/20 TE: Are you allergic to latex? Yes   If yes, please explain reaction(s)itchiness     Levaquin [Levofloxacin]  Other     Hospitalized for "heart" problem    Macrolides Other     History of QT-C prolongation    Morphine Itching  Current Outpatient Medications   Medication Sig    albuterol (PROVENTIL) (2.5 MG/3ML) 0.083% nebulization 3 mL (2.5 mg) by Nebulization route every 4 hours as needed for Wheezing.    albuterol 108 (90 Base) MCG/ACT inhaler Inhale 2 puffs by mouth every 6 hours as needed for Wheezing.    Albuterol Sulfate 108 (90 Base) MCG/ACT AEPB Inhale 1 puff by mouth as needed.    ASPIRIN LOW DOSE 81 MG EC tablet TAKE 1 TABLET BY MOUTH EVERY DAY    atorvastatin (LIPITOR) 40 MG tablet Take 1 tablet (40 mg) by mouth daily.    controlled substance agreement controlled substance agreement    doxycycline (MONODOX) 100 MG capsule Take 1 capsule (100 mg) by mouth 2 times daily.    famotidine (PEPCID) 40 MG tablet Take 1 tablet (40 mg) by mouth daily.    ferrous sulfate 325 (65 Fe) MG tablet Take 1 tablet (325 mg) by mouth daily.    fexofenadine (ALLEGRA) 180 MG tablet Take 1 tablet (180 mg) by mouth daily.    fluticasone-umeclidinium-vilanterol (TRELEGY ELLIPTA) 100-62.5-25 MCG/INH AEPB Inhale 1 puff by mouth daily.    folic acid (FOLVITE) 1 MG tablet Take 1 tablet (1 mg) by mouth daily.    guaiFENesin-codeine (ROBITUSSIN AC) 100-10 MG/5ML oral solution Take 5 mL by mouth every 6 hours as needed for Cough.    hydroCHLOROthiazide (HYDRODIURIL) 25 MG tablet Take 1 tablet (25 mg) by mouth daily.    hydrOXYzine HCL (ATARAX) 50 MG tablet Take 1 tablet (50 mg) by mouth 2 times daily as needed for Itching. (Patient taking differently: Take 1 tablet (50 mg) by mouth 2 times daily as needed for Itching. hives)    insulin lispro, 1 Unit Dial, (HUMALOG KWIKPEN) 100 units/mL INJECT 20 UNITS UNDER THE SKIN 3 TIMES DAILY AS NEEDED FOR HIGH BLOOD SUGAR. IN THE MORNING 20 UNITS + SLIDING SCALE MID DAY: 17 + SLIDING SCALE AND IN THE EVENING 12 + SLIDING SCALE    Insulin Pen Needles (BD PEN NEEDLE NANO 2ND GEN) 32G X 4 MM MISC Inject  1 each under the skin 4 times daily. With each insulin administration    LANTUS SOLOSTAR 100 UNIT/ML SOPN Apply 80 Units topically daily.    LANTUS SOLOSTAR 100 UNIT/ML SOPN INJECT 80 UNITS SUBCUTANEOUSLY ONCE DAILY AT  NIGHT    LANTUS SOLOSTAR 100 UNIT/ML SOPN INJECT 80 UNITS SUBCUTANEOUSLY AT NIGHT    losartan (COZAAR) 50 MG tablet Take 1 tablet (50 mg) by mouth daily.    metoprolol tartrate (LOPRESSOR) 50 MG tablet Take 1 tablet (50 mg) by mouth 2 times daily.    mirtazapine (REMERON) 30 MG tablet Take 1 tablet (30 mg) by mouth nightly.    montelukast (SINGULAIR) 10 MG tablet Take 1 tablet (10 mg) by mouth every evening.    mupirocin (BACTROBAN) 2 % ointment APPLY 1 APPLICATION TOPICALLY 2 TIMES DAILY. USE A SMALL AMOUNT AS DIRECTED    naloxone (NARCAN) 4 mg/0.1 mL nasal spray Spray 1 spray into one nostril once as needed.    ondansetron (ZOFRAN ODT) 8 MG disintegrating tablet TAKE 1 TABLET ON OR UNDER THE TONGUE EVERY 8 HOURS AS NEEDED FOR NAUSEA VOMITING.    oxycodone-acetaminophen (PERCOCET) 10-325 MG tablet Take 0.5 tablets by mouth every 6 hours as needed for Severe Pain (Pain Score 7-10).    pantoprazole (PROTONIX) 40 MG tablet Take 1 tablet (40 mg) by mouth every morning (before breakfast).    pregabalin (LYRICA) 75 MG capsule TAKE 1  CAPSULE BY MOUTH THREE TIMES DAILY     No current facility-administered medications for this visit.       ROS:    Review of Systems   Constitutional:  Positive for fatigue.   HENT: Negative.     Eyes:  Negative for photophobia, pain, discharge, redness, itching and visual disturbance.   Respiratory:  Positive for chest tightness and shortness of breath.    Cardiovascular:  Negative for chest pain, palpitations and leg swelling.   Gastrointestinal:  Positive for diarrhea and nausea. Negative for blood in stool, constipation and vomiting.   Endocrine: Negative for cold intolerance, heat intolerance, polydipsia, polyphagia and polyuria.   Genitourinary: Negative.     Musculoskeletal:  Positive for arthralgias, back pain, joint swelling and myalgias. Negative for gait problem, neck pain and neck stiffness.   Allergic/Immunologic: Positive for environmental allergies.   Neurological:  Negative for dizziness, syncope, weakness, numbness and headaches.       Full ros as per EPIC and reviewed with patient.      Objective: There were no vitals taken for this visit.     GEN: Appears to be in no apparent distress, breathing comfortably  Eyes: anicteric   HENT: Normocephalic, atraumatic in appearance    Pulm: Breathing pattern appears normal, no increased work of breathing appreciated visually   Neuro: Alert, awake, mild tremor   Psych: Non-pressured speech   Skin: No jaundice appreciated   Musculoskeletal: Normal muscle bulk per visual exam      Labs and chart reviewed.    Lab Results   Component Value Date    A1C 7.4 (H) 03/28/2022    A1C 7.2 (H) 04/02/2021    A1C 7.3 (H) 10/30/2020     Lab Results   Component Value Date    CHOL 315 (H) 03/28/2022    HDL 39 03/28/2022    LDLCALC 236 (H) 03/28/2022    TRIG 200 (H) 03/28/2022     Lab Results   Component Value Date    TSH 2.12 03/28/2022       Assessment and Plan:     Kimberly Curtis was seen today for follow up.    Diagnoses and all orders for this visit:    1) Type 2 diabetes mellitus with diabetic autonomic neuropathy, with long-term current use of insulin (CMS-HCC)  Her glycemias have been controlled numbers. HbA1c was 7.4% which is the same that it was on the last sample.  She will continue improving her diet at this time. We need to recheck her levels ASAP. will follow     2) Hypertensive heart disease without HF (heart failure)  Her BP levels have been controlled. Continued to improve diet and exercise at this time.   We will follow.      3) Mixed Hyperlipidemia due to DM:  On review from 03/28/2022 Trigs 200, TC 315, HDL 39, LDL 236.  She has not been taking her medications due to the antibiotics that have been prescribed for  her lung infection. We will restart ASAP and follow up.      4) Iron deficiency Anemia:  Her Hb continues to be around 10/4 since September. She has not been taking any Iron supplementations and we will restart this today. We will follow this in 2-3 months. Stable condition.      5) COPD:  Stable at this time. She has continued with Chronic O2 supplementation and follow up with pulmonary as well.   We will follow. Stable condition.  6) Sarcoidosis:  She has not been seen by a Lung specialist since last year. Her insurance changed. So she needs to needs a new Cardiologist and Pulmonary specialist. She will be sent for referrals for the above.   We will follow.          Screening mammogram for breast cancer             Referrals:   No referrals found.      Return to Clinic: No follow-ups on file.     Patient Instructions: There are no Patient Instructions on file for this visit.   I conducted a 40-minute face-to-face encounter with the patient during which we discussed observed changes during the visit and reviewed medical records and studies.     Barriers to learning assessed: None.  Patient/family verbalizes understanding and is agreeable to above plan.  Medications were reconciled during this visit, and complex decision making was performed. Discussed diagnoses and plans of care. Discussed importance to take medications as directed, potential side effects, purpose and to immediately report to healthcare provider for any adverse effects.  Reviewed 911 or Emergency Department precautions, and when to seek further medical care for emergent conditions.  All questions and concerns answered to patient/family satisfaction.The plan was carefully reviewed verbally with the patient/family member/caregiver, and also affirmed that the patient/family member/caregiver understood next steps and follow up plan.    Reviewed verbally and AVS available via MyChart for patient.      Suzan Nailer, MD   Signature Derived  From Controlled Access Password, Aug 07, 2022, 10:47 AM.  [facility]

## 2022-08-07 NOTE — Addendum Note (Signed)
Addended by: Suzan Nailer on: 08/07/2022 11:08 AM     Modules accepted: Orders

## 2022-09-08 ENCOUNTER — Encounter (INDEPENDENT_AMBULATORY_CARE_PROVIDER_SITE_OTHER): Payer: Self-pay | Admitting: Internal Medicine

## 2022-09-09 NOTE — Telephone Encounter (Signed)
From: Marybeth Petruzzi  To: Thermon Leyland Diaz-Gonzalez  Sent: 09/08/2022 11:10 AM PDT  Subject: Dexcom Supplies     Can you please fill out the the approval forms for them so I can get my supplies I'm completely out of them they said they have not received it back from you yet I would truly appreciate it if you can get it back from you  Thank you so much and also have not made a appointment for me and my husband

## 2022-09-11 ENCOUNTER — Telehealth (INDEPENDENT_AMBULATORY_CARE_PROVIDER_SITE_OTHER): Payer: Self-pay | Admitting: Internal Medicine

## 2022-09-11 NOTE — Telephone Encounter (Signed)
Received urgent documents from [facility]. Forms placed in Dr.Diaz-Gonzalez, Vicente in box. Once MD reviews and signs, records will be faxed to number provided on forms. Once successfully faxed, records will be scanned into Epic and will be available under Media Tab for further viewing.

## 2022-09-12 ENCOUNTER — Telehealth: Payer: Self-pay | Admitting: Pharmacist

## 2022-09-12 NOTE — Telephone Encounter (Signed)
Comprehensive Medication Review - Primary Care     Contacted patient via MyChart to schedule a CMR with Eric Dehner, PharmD.     Sent MyChart message to patient. Provided contact number for [facility] MTM program & requested patient to call back if they wish to schedule appointment.     Attempt: 1    Judy Bracamontes, CPhT  Pharmacy Technician III (MTM)  [facility]  Email: [email or web address]  Phone: [phone]

## 2022-09-16 NOTE — Telephone Encounter (Signed)
Comprehensive Medication Review - Primary Care     Contacted patient via MyChart to schedule a CMR with Eric Dehner, PharmD.      Sent MyChart message to patient. Provided contact number for [facility] MTM program & requested patient to call back if they wish to schedule appointment.     Attempt: 2     Judy Bracamontes, CPhT  Pharmacy Technician III (MTM)  [facility]  Email: [email or web address]  Phone: [phone]

## 2022-09-18 ENCOUNTER — Encounter (INDEPENDENT_AMBULATORY_CARE_PROVIDER_SITE_OTHER): Payer: Self-pay | Admitting: Internal Medicine

## 2022-09-18 NOTE — Telephone Encounter (Signed)
Comprehensive Medication Review - Primary Care     Contacted patient via MyChart to schedule a CMR with Eric Dehner, PharmD. Provided contact number for [facility] MTM program & requested patient to call back if they wish to schedule appointment.     Attempt: 3 (Final)     Outcomes MTM will be updated as 'Unable to reach patient'      Judy Bracamontes, CPhT  Pharmacy Technician III (MTM)  [facility]  Email: [email or web address]  Phone: [phone]

## 2022-09-19 NOTE — Telephone Encounter (Signed)
From: Jahmiya Laughner  To: Suzan Nailer, MD  Sent: 09/18/2022 2:55 PM PDT  Subject: Vira Agar G7    Just got off the phone with the [facility] and they have not got the paperwork they need a new prescription and my medical records to renew my supplies for the Dexcom G7   Thank you very much

## 2022-09-23 ENCOUNTER — Encounter (INDEPENDENT_AMBULATORY_CARE_PROVIDER_SITE_OTHER): Payer: Self-pay | Admitting: Internal Medicine

## 2022-09-26 NOTE — Telephone Encounter (Signed)
The requested documents have been successfully completed and transmitted.     Faxed to provided number; To be scanned into pt chart under "Media" tab    Lesia Hausen  Signature Generated From [employer], September 26, 2022, 2:34 PM.

## 2022-09-29 ENCOUNTER — Encounter (INDEPENDENT_AMBULATORY_CARE_PROVIDER_SITE_OTHER): Payer: Self-pay | Admitting: Internal Medicine

## 2022-09-29 ENCOUNTER — Encounter (INDEPENDENT_AMBULATORY_CARE_PROVIDER_SITE_OTHER): Payer: Medicaid Other | Admitting: Clinical Cardiac Electrophysiology

## 2022-09-29 ENCOUNTER — Encounter (INDEPENDENT_AMBULATORY_CARE_PROVIDER_SITE_OTHER): Payer: Medicaid Other | Admitting: Heart Failure and Transplant Cardiology

## 2022-09-29 ENCOUNTER — Telehealth (INDEPENDENT_AMBULATORY_CARE_PROVIDER_SITE_OTHER): Payer: Self-pay | Admitting: Clinical Cardiac Electrophysiology

## 2022-09-29 DIAGNOSIS — Z45018 Encounter for adjustment and management of other part of cardiac pacemaker: Secondary | ICD-10-CM

## 2022-09-29 DIAGNOSIS — Z95810 Presence of automatic (implantable) cardiac defibrillator: Secondary | ICD-10-CM

## 2022-09-30 ENCOUNTER — Encounter (INDEPENDENT_AMBULATORY_CARE_PROVIDER_SITE_OTHER): Payer: Self-pay | Admitting: Internal Medicine

## 2022-09-30 NOTE — Telephone Encounter (Signed)
Red alert, scheduled summary:      Alert for: AF >5 min, atrial events falling in refractory resulting in loss of ventricular tracking at times. AP: 26%. VP: 95%. Mode: DDD. Atrial episodes x10, c/w AFL, longest ~10 min. Atrial Burden: 0.9%. Please confirm AC status. Normal device function.      Full report has been uploaded to media for review.

## 2022-09-30 NOTE — Telephone Encounter (Signed)
From: Lita Alcaide  To: Suzan Nailer, MD  Sent: 09/29/2022 7:16 PM PDT  Subject: Attention [name]    Please make sure that the send in the new updated medical records they have new prescription for the Dexcom G7 so that I can get my supplies and can you please set a new appointment for [place] to se tha doctor I just was released from the hospital today and my husband needs to see the doctor else well   Thank you ver much and I truly appreciate it

## 2022-10-01 ENCOUNTER — Encounter (HOSPITAL_COMMUNITY): Payer: Self-pay | Admitting: Clinical Cardiac Electrophysiology

## 2022-10-01 DIAGNOSIS — I480 Paroxysmal atrial fibrillation: Secondary | ICD-10-CM

## 2022-10-01 MED ORDER — APIXABAN 5 MG PO TABS
5.0000 mg | ORAL_TABLET | Freq: Two times a day (BID) | ORAL | 4 refills | Status: DC
Start: 2022-10-01 — End: 2023-03-21

## 2022-10-01 NOTE — Telephone Encounter (Signed)
Called and spoke with Pt; Action taken: Pt confirmed [facility] stated they will be sending temporary supply to prevent gap; Pt also expressed need for appt ASAP for an ER f/u; Pt d/c yesterday; Scheduled ER f/u with Elane Fritz for 7/16 @ 0900, but still on W/L for PCP cancellation    Patient/Pt Representative verbalized understanding and agrees with plan, will follow-up accordingly.    Lesia Hausen.  October 01, 2022, 1:38 PM.

## 2022-10-01 NOTE — Telephone Encounter (Signed)
Called and spoke with pt; Action taken: Advised pt I would s/w [facility] to see what the discrepancy is as all orders have been signed and sent along with requested medical records, and follow-up accordingly.     Patient/Pt Representative verbalized understanding and agrees with plan, will follow-up accordingly.    Lesia Hausen.  October 01, 2022, 11:08 AM.

## 2022-10-01 NOTE — Progress Notes (Signed)
Left voicemail that her device interrogation saw PAF. Given her age, female, sex, HF, DM, HTN she is at increased risk of CVA. Will stop ASA and start Eliquis 5mg  twice daily.

## 2022-10-01 NOTE — Telephone Encounter (Signed)
Called and s/w pharmacy staff;  Per Hilda Lias with [facility], records received, but not prescription; Advised Hilda Lias both items were sent together on same fax, but will re-fax order only; [facility] sending pt bridge supply until order received to prevent gap in therapy.    Lesia Hausen.  October 01, 2022, 11:26 AM.

## 2022-10-02 ENCOUNTER — Telehealth (HOSPITAL_COMMUNITY): Payer: Self-pay | Admitting: Clinical Cardiac Electrophysiology

## 2022-10-02 NOTE — Telephone Encounter (Signed)
"    Left voicemail that her device interrogation saw PAF. Given her age, female, sex, HF, DM, HTN she is at increased risk of CVA. Will stop ASA and start Eliquis 5mg  twice daily.    "  Called and spoke with Kimberly Curtis, identification verified.    She received the voicemail. She just got out of the hospital issues with hypoglycemia regulating. She is concerned about starting the new medication, but education about it - she understands and plans to start the new medication. (Oldest brother died from stroke, older sister had stroke on each side of her brain, baby sister had a small stroke). She is aware to stop Aspirin, not to take any NSAID medications, ED if she falls/hits her head while on eliquis, bruising will be easier/small cuts will need more time to heal than normal but okay to continue taking the medication if just these. Main side effect of medication is bleeding. She can talk to her pharmacist more about it on pick-up.     She will start the medication, and plans to talk more about eliquis with Dr. Dillard Essex on Wednesday.     Kimberly Octave, RN

## 2022-10-07 ENCOUNTER — Encounter (INDEPENDENT_AMBULATORY_CARE_PROVIDER_SITE_OTHER): Payer: Self-pay | Admitting: Family

## 2022-10-07 ENCOUNTER — Ambulatory Visit (INDEPENDENT_AMBULATORY_CARE_PROVIDER_SITE_OTHER): Payer: 141 | Admitting: Family

## 2022-10-07 VITALS — BP 130/64 | HR 77 | Temp 98.7°F | Ht 64.5 in | Wt 224.9 lb

## 2022-10-07 DIAGNOSIS — D869 Sarcoidosis, unspecified: Secondary | ICD-10-CM

## 2022-10-07 DIAGNOSIS — R197 Diarrhea, unspecified: Secondary | ICD-10-CM

## 2022-10-07 DIAGNOSIS — Z794 Long term (current) use of insulin: Secondary | ICD-10-CM

## 2022-10-07 DIAGNOSIS — Z950 Presence of cardiac pacemaker: Secondary | ICD-10-CM

## 2022-10-07 DIAGNOSIS — J4489 Other specified chronic obstructive pulmonary disease (CMS-HCC): Secondary | ICD-10-CM

## 2022-10-07 DIAGNOSIS — E1143 Type 2 diabetes mellitus with diabetic autonomic (poly)neuropathy: Secondary | ICD-10-CM

## 2022-10-07 MED ORDER — AMOXICILLIN-POT CLAVULANATE 875-125 MG OR TABS
1.00 | ORAL_TABLET | Freq: Two times a day (BID) | ORAL | Status: AC
Start: 2022-09-29 — End: 2022-10-09

## 2022-10-07 NOTE — Progress Notes (Signed)
Chief Complaint   Patient presents with    Hospital F/U       Subjective:      Kimberly Curtis is a 67 year old female who presents to primary care for:  This is a hospital follow up admitted at [facility] 09/25/22   Discharge diagnoses:   1. Multifocal cavitary pneumonia with severe sepsis  2. 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 Diabetes mellitus- having fluctuations on her BS   4. COPD exacerbation  5. OSA on CPAP  6. Noncardiac chest pain, MI was ruled     Pt was given Augmentin but states had been having diarrhea she still have 2 more days   She states she is better although she still has SOB on exertion but had been more functional     Cxray showed Cardiomegaly with mild pulmonary edema.   CTA showed 1. Multifocal bilateral airspace disease with some associated cavitation in the left lower lobe. Findings may represent cavitary pneumonia, although superimposed septic emboli could also have this appearance.   2. Stable bulky mediastinal and hilar adenopathy with some associated calcification consistent with clinical history of sarcoidosis.   3. Increased mild aneurysmal dilatation of ascending thoracic aorta.   4. No pulmonary emboli.     TTE showed Normal LV size and function - EF 55-60%   1a. Mild cLVH   1b. Diastology is moderately impaired (pseudo-normal LV filling on mitral   inflow) - elevated LA pressuer   2. Mild LAE   2a. Normal RA/RV: ICD wires are noted in the right heart   3. Mildly thickened AoV leaflets   4. Moderate to severe MAC with MV leaflet thickening and calcification -   Mild MR   5. Mild TR and PR   6. PA pressure is 45 mmHg   7. No mass, thrombi or pericardial effusion     Labs 7/7  Last Hgb 10.4  from 9.6 WBC 10   CMP- lft - normal Creat 0.8  GFR- 82          Review of Systems   Constitutional:  Positive for fatigue.   HENT: Negative.     Respiratory:  Positive for shortness of breath.    Gastrointestinal:  Positive for diarrhea.   Endocrine: Negative.     Genitourinary: Negative.    Musculoskeletal: Negative.    Neurological: Negative.    Psychiatric/Behavioral: Negative.                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       Surgical History:  Past Surgical History:   Procedure Laterality Date    AICD pacemaker N/A 09/2018    CHOLECYSTECTOMY      HYSTERECTOMY      KNEE ARTHROSCOPY Left        Allergies:  Allergies   Allergen Reactions    Nsaids Nausea Only and Other     Per pt: Burning of stomach lining and nausea     Sulfa Drugs Anaphylaxis    Benadryl [Diphenhydramine] Rash and Itching     Per 12/25/20 TE: Do you have any additional known allergies  to over the counter or prescription medications? Yes   If yes, please list additional medication(s) dye codeine on benadryl  and explain reaction(s)itching, rash     Latex Itching     Per 12/25/20 TE: Are you allergic to latex? Yes   If yes, please explain reaction(s)itchiness     Levaquin [Levofloxacin] Other     Hospitalized for "heart" problem    Macrolides Other     History of QT-C prolongation    Morphine Itching       Medications:  Current Outpatient Medications on File Prior to Visit   Medication Sig Dispense Refill    albuterol (PROVENTIL) (2.5 MG/3ML) 0.083% nebulization 3 mL (2.5 mg) by Nebulization route every 4 hours as needed for Wheezing. 3 mL 3    albuterol 108 (90 Base) MCG/ACT inhaler Inhale 2 puffs by mouth every 6 hours as needed for Wheezing. 18 g 4    Albuterol Sulfate 108 (90 Base) MCG/ACT AEPB Inhale 1 puff by mouth as needed.      amoxicillin-clavulanate (AUGMENTIN) 875-125 MG tablet Take 1 tablet by mouth 2 times daily.      apixaban (ELIQUIS) 5 MG TABS Take 1 tablet (5 mg) by mouth 2 times daily. 60 tablet 4    atorvastatin (LIPITOR) 40 MG tablet  Take 1 tablet (40 mg) by mouth daily. 90 tablet 1    controlled substance agreement controlled substance agreement 1 each 0    doxycycline (MONODOX) 100 MG capsule Take 1 capsule (100 mg) by mouth 2 times daily. 60 capsule 3    famotidine (PEPCID) 40 MG tablet Take 1 tablet (40 mg) by mouth daily. 90 tablet 1    ferrous sulfate 325 (65 Fe) MG tablet Take 1 tablet (325 mg) by mouth daily. 90 tablet 3    fexofenadine (ALLEGRA) 180 MG tablet Take 1 tablet (180 mg) by mouth daily. 90 tablet 1    fluticasone-umeclidinium-vilanterol (TRELEGY ELLIPTA) 100-62.5-25 MCG/INH AEPB Inhale 1 puff by mouth daily.      folic acid (FOLVITE) 1 MG tablet Take 1 tablet (1 mg) by mouth daily. 90 tablet 1    guaiFENesin-codeine (ROBITUSSIN AC) 100-10 MG/5ML oral solution Take 5 mL by mouth every 6 hours as needed for Cough. 120 mL 0    hydroCHLOROthiazide (HYDRODIURIL) 25 MG tablet Take 1 tablet (25 mg) by mouth daily. 90 tablet 3    hydrOXYzine HCL (ATARAX) 50 MG tablet Take 1 tablet (50 mg) by mouth 2 times daily as needed for Itching. (Patient taking differently: Take 1 tablet (50 mg) by mouth 2 times daily as needed for Itching. hives) 180 tablet 3    insulin lispro, 1 Unit Dial, (HUMALOG KWIKPEN) 100 units/mL INJECT 20 UNITS UNDER THE SKIN 3 TIMES DAILY AS NEEDED FOR HIGH BLOOD SUGAR. IN THE MORNING 20 UNITS + SLIDING SCALE MID DAY: 17 + SLIDING SCALE AND IN THE EVENING 12 + SLIDING SCALE 15 mL 11    Insulin Pen Needles (BD PEN NEEDLE NANO 2ND GEN) 32G X 4 MM MISC Inject 1 each under the skin 4 times daily. With each insulin administration 400 each 2    LANTUS SOLOSTAR 100 UNIT/ML SOPN Apply 80 Units topically daily. 15 mL 11    LANTUS SOLOSTAR 100 UNIT/ML SOPN INJECT 80 UNITS SUBCUTANEOUSLY ONCE DAILY AT  NIGHT 15 mL 0    LANTUS SOLOSTAR 100 UNIT/ML SOPN INJECT 80 UNITS SUBCUTANEOUSLY AT NIGHT 15 mL 0    losartan (COZAAR) 50 MG tablet Take 1 tablet (  50 mg) by mouth daily. 90 tablet 3    metoprolol tartrate (LOPRESSOR) 50 MG tablet  Take 1 tablet (50 mg) by mouth 2 times daily. 180 tablet 3    mirtazapine (REMERON) 30 MG tablet Take 1 tablet (30 mg) by mouth nightly. 90 tablet 1    montelukast (SINGULAIR) 10 MG tablet Take 1 tablet (10 mg) by mouth every evening. 90 tablet 3    mupirocin (BACTROBAN) 2 % ointment APPLY 1 APPLICATION TOPICALLY 2 TIMES DAILY. USE A SMALL AMOUNT AS DIRECTED 22 g 1    naloxone (NARCAN) 4 mg/0.1 mL nasal spray Spray 1 spray into one nostril once as needed.      ondansetron (ZOFRAN ODT) 8 MG disintegrating tablet Take 1 tablet (8 mg) on or under the tongue every 8 hours as needed for Nausea/Vomiting. 30 tablet 3    oxycodone-acetaminophen (PERCOCET) 10-325 MG tablet Take 0.5 tablets by mouth every 6 hours as needed for Severe Pain (Pain Score 7-10). 30 tablet 0    pantoprazole (PROTONIX) 40 MG tablet Take 1 tablet (40 mg) by mouth every morning (before breakfast). 90 tablet 2    pregabalin (LYRICA) 75 MG capsule TAKE 1 CAPSULE BY MOUTH THREE TIMES DAILY 90 capsule 0     No current facility-administered medications on file prior to visit.       Social History:  Social History     Socioeconomic History    Marital status: Married   Tobacco Use    Smoking status: Former    Smokeless tobacco: Never   Substance and Sexual Activity    Alcohol use: Not Currently    Drug use: Never     Family History:  Family History   Problem Relation Name Age of Onset    Diabetes Mother      Heart Disease Mother      Kidney Disease Mother      Hypertension Father      Alcohol/Drug Father               Objective:     Vitals:    10/07/22 0912   BP: 133/66   Pulse: 77   Temp: 98.7 F (37.1 C)   Height: 5' 4.5" (1.638 m)   Weight: 102 kg (224 lb 13.9 oz)   SpO2: 100%   TempSrc: Temporal   BMI kg/m2: 38 kg/m2       Physical Exam  Constitutional:       Appearance: Normal appearance.   Cardiovascular:      Pulses: Normal pulses.      Heart sounds: Normal heart sounds.      Comments: Occasional skipped beats  Pulmonary:      Breath sounds: Wheezing  (occasional wheezing) present.   Abdominal:      General: Bowel sounds are normal. There is no distension.      Tenderness: There is no abdominal tenderness. There is no rebound.   Skin:     Findings: No rash.   Neurological:      Mental Status: She is alert and oriented to person, place, and time.   Psychiatric:         Behavior: Behavior normal.            Assessment and Plan:     Kimberly Curtis was seen today for hospital f/u.    Diagnoses and all orders for this visit:    Type 2 diabetes mellitus with diabetic autonomic neuropathy, with long-term current use of insulin (CMS-HCC)  Comments:  A 67 year old female with fluctuating BS Pt is on Humalog and Lantus ., just recently been admitted in the [facility] for pneumonia Hx of Sarcoidosis  Advised to decrease Lantus to 2 units  if FBS is 70 and below   Orders:  -     Consult/Referral to [facility] Education [facility]    S/P placement of cardiac pacemaker  Pt will see Dr  Dr Dillard Essex [phone] [place] [phone] Tomorrow Urgent referral done   Pt will start Eliquis aware to d/c ASA   -     Consult/Referral to [facility]  -     Consult/Referral to Cardiology Clinic    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)  Followed at the Pulmonary Clinic - referral done   -     Consult/Referral to Pulmonary and Sleep; Future    Sarcoidosis  Sees Dr Leanora Ivanoff Urgent referral done   -     Consult/Referral to [facility]  -     Consult/Referral to Pulmonary and Sleep; Future    Diarrhea, unspecified type  Might be sec to Augmentin she still has 2 more days Will check for C diff  -     Clostridioides (Clostridium) difficile Testing Algorithm Liquid Stool in Sterile Container; Future       ER precautions discussed             Patient Instruction: See Patient Education Section    Followup:       Keep appt with PCP on Thursday           Electronically signed by:  Sharlynn Oliphant, NP   [place] [facility]

## 2022-10-08 ENCOUNTER — Telehealth (INDEPENDENT_AMBULATORY_CARE_PROVIDER_SITE_OTHER): Payer: Self-pay | Admitting: Internal Medicine

## 2022-10-08 ENCOUNTER — Encounter (INDEPENDENT_AMBULATORY_CARE_PROVIDER_SITE_OTHER): Payer: Self-pay | Admitting: Internal Medicine

## 2022-10-08 DIAGNOSIS — M797 Fibromyalgia: Secondary | ICD-10-CM

## 2022-10-08 DIAGNOSIS — R600 Localized edema: Secondary | ICD-10-CM

## 2022-10-08 DIAGNOSIS — E782 Mixed hyperlipidemia: Secondary | ICD-10-CM

## 2022-10-08 DIAGNOSIS — D869 Sarcoidosis, unspecified: Secondary | ICD-10-CM

## 2022-10-08 DIAGNOSIS — G894 Chronic pain syndrome: Secondary | ICD-10-CM

## 2022-10-08 DIAGNOSIS — E119 Type 2 diabetes mellitus without complications: Secondary | ICD-10-CM

## 2022-10-08 DIAGNOSIS — K219 Gastro-esophageal reflux disease without esophagitis: Secondary | ICD-10-CM

## 2022-10-08 DIAGNOSIS — T7840XA Allergy, unspecified, initial encounter: Secondary | ICD-10-CM

## 2022-10-08 DIAGNOSIS — L299 Pruritus, unspecified: Secondary | ICD-10-CM

## 2022-10-08 MED ORDER — FOLIC ACID 1 MG OR TABS
1.0000 mg | ORAL_TABLET | Freq: Every day | ORAL | 1 refills | Status: AC
Start: 2022-10-08 — End: ?

## 2022-10-08 MED ORDER — HYDROXYZINE HCL 50 MG OR TABS
50.0000 mg | ORAL_TABLET | Freq: Two times a day (BID) | ORAL | 3 refills | Status: DC | PRN
Start: 2022-10-08 — End: 2023-11-19

## 2022-10-08 MED ORDER — BD PEN NEEDLE NANO 2ND GEN 32G X 4 MM MISC
1.0000 | Freq: Four times a day (QID) | 2 refills | Status: AC
Start: 2022-10-08 — End: ?

## 2022-10-08 MED ORDER — ATORVASTATIN CALCIUM 40 MG OR TABS
40.0000 mg | ORAL_TABLET | Freq: Every day | ORAL | 1 refills | Status: AC
Start: 2022-10-08 — End: ?

## 2022-10-08 MED ORDER — FEXOFENADINE HCL 180 MG OR TABS
180.0000 mg | ORAL_TABLET | Freq: Every day | ORAL | 1 refills | Status: AC
Start: 2022-10-08 — End: ?

## 2022-10-08 MED ORDER — PANTOPRAZOLE SODIUM 40 MG OR TBEC
40.0000 mg | DELAYED_RELEASE_TABLET | Freq: Every day | ORAL | 2 refills | Status: AC
Start: 2022-10-08 — End: ?

## 2022-10-08 MED ORDER — MONTELUKAST SODIUM 10 MG OR TABS
10.0000 mg | ORAL_TABLET | Freq: Every evening | ORAL | 3 refills | Status: AC
Start: 2022-10-08 — End: ?

## 2022-10-08 MED ORDER — INSULIN LISPRO (1 UNIT DIAL) 100 UNIT/ML SC SOPN
PEN_INJECTOR | SUBCUTANEOUS | 11 refills | Status: AC
Start: 2022-10-08 — End: ?

## 2022-10-08 MED ORDER — HYDROCHLOROTHIAZIDE 25 MG OR TABS
25.0000 mg | ORAL_TABLET | Freq: Every day | ORAL | 3 refills | Status: AC
Start: 2022-10-08 — End: ?

## 2022-10-08 MED ORDER — PREGABALIN 75 MG OR CAPS
ORAL_CAPSULE | ORAL | 0 refills | Status: DC
Start: 2022-10-08 — End: 2023-02-10

## 2022-10-08 NOTE — Telephone Encounter (Signed)
From: Laekyn Holmer  To: Suzan Nailer, MD  Sent: 10/08/2022 3:12 PM PDT  Subject: Medication     Can you send in a prescription for the oxycodone and refill on all my medication and my husband until we can get all this figured out please I forgot yesterday when I was there and we needed our medicine   Thank you very much I truly appreciate it

## 2022-10-09 ENCOUNTER — Telehealth (INDEPENDENT_AMBULATORY_CARE_PROVIDER_SITE_OTHER): Payer: Self-pay | Admitting: Clinical Cardiac Electrophysiology

## 2022-10-09 ENCOUNTER — Encounter (INDEPENDENT_AMBULATORY_CARE_PROVIDER_SITE_OTHER): Payer: BLUE CROSS/BLUE SHIELD | Admitting: Internal Medicine

## 2022-10-09 MED ORDER — OXYCODONE-ACETAMINOPHEN 10-325 MG OR TABS
0.5000 | ORAL_TABLET | Freq: Four times a day (QID) | ORAL | 0 refills | Status: AC | PRN
Start: 2022-10-09 — End: ?

## 2022-10-09 NOTE — Telephone Encounter (Signed)
Vector Yellow Alert:     Alert for: R waves currently measuring 2.69mV. AP: 26%. VP: 94%. Mode: DDD. Atrial episodes x10, c/w AF, longest ~10 min. Atrial Burden: 0.9%. AC status previously requested. Normal device function.      Full report has been uploaded to media for review.

## 2022-10-10 ENCOUNTER — Telehealth (INDEPENDENT_AMBULATORY_CARE_PROVIDER_SITE_OTHER): Payer: Self-pay | Admitting: Internal Medicine

## 2022-10-10 NOTE — Telephone Encounter (Signed)
Vector updated with AC status.      Hoffmayer, Everlene Balls, MD  You47 minutes ago (7:59 AM)       Eliquis     You routed conversation to [employer], Everlene Balls, MD; Scv Device Team20 hours ago (11:52 AM)

## 2022-10-10 NOTE — Telephone Encounter (Signed)
Prior Auth Request for Hydroxyzine  has been placed in queue for processing. Please allow up to 72 business hours for initiation.     For GLP1(+/-GIP) medications, please allow up to 4-6 weeks for submission and determination.     If this is an urgent request due to immediate therapy please re-route as high priority.       Thank you!     [facility]   Refill and Prior Auth Clinical Services

## 2022-10-10 NOTE — Telephone Encounter (Signed)
Prior Auth Request APPROVED for Hydroxyzine         Valid until 03/24/2023          Pharmacy has been notified and will contact patient when ready to be picked up.     No further action needed, closing encounter.       Thank you!    [facility]   Refill and Prior [place]

## 2022-10-10 NOTE — Telephone Encounter (Signed)
Prior Auth Request submitted for hydroxyzine  on 10/10/22  via CMM Key # [id number]    ** Please allow 3-5 business days for determination from insurance**      Diagnosis:  itching  ICD-10:    Medications Tried/Failed:    Justification:       PMAC will monitor status.     [facility]   Refill and Prior Auth Clinical Services

## 2022-10-15 ENCOUNTER — Telehealth (INDEPENDENT_AMBULATORY_CARE_PROVIDER_SITE_OTHER): Payer: Self-pay | Admitting: Internal Medicine

## 2022-10-15 ENCOUNTER — Encounter (INDEPENDENT_AMBULATORY_CARE_PROVIDER_SITE_OTHER): Payer: Self-pay | Admitting: Internal Medicine

## 2022-10-15 DIAGNOSIS — Z794 Long term (current) use of insulin: Secondary | ICD-10-CM

## 2022-10-15 DIAGNOSIS — E119 Type 2 diabetes mellitus without complications: Secondary | ICD-10-CM

## 2022-10-15 NOTE — Telephone Encounter (Signed)
GENERAL INQUIRY:    Caller Information:  Who is calling? Ancillary: Gerilyn Pilgrim from [facility] is calling on behalf of patient.  Best way to contact: [phone]  What is the reason for the call? Gerilyn Pilgrim calling in stating patients insurance has been fixed and a new order for Dexcom G7 supplies needs to be resubmitted. Per Gerilyn Pilgrim patient is out of medication and would like for order to be expedited and placed to advance diabetes supplies.   Encounter Details:  Is this a duplicate encounter?: No previous documentation found on this issue.  Action required by the office: Please process request  Patient Information:  Is MyChart active?: Yes.  Verification:  Has the inquiry been read verbatim to the caller and verbalizes satisfaction and confirms the above is accurate?: Yes  Caller has been advised this message will be transmitted to the office and can expect a response within the next 24-72 hours during working business days.  Encounter created by Evalyn Casco from the Care Assist Team.  Signature generated from controlled access password on October 15, 2022 at 4:04 PM.

## 2022-10-16 MED ORDER — DEXCOM G7 RECEIVER DEVI
1.0000 | Freq: Every day | 2 refills | Status: AC
Start: 2022-10-16 — End: 2023-01-14

## 2022-10-16 MED ORDER — DEXCOM G7 SENSOR MISC
1.0000 | 3 refills | Status: AC
Start: 2022-10-16 — End: 2022-11-15

## 2022-10-17 NOTE — Telephone Encounter (Signed)
From: Satcha Lett  To: Suzan Nailer, MD  Sent: 10/15/2022 5:03 PM PDT  Subject: Dexcom Supplies     The insurance just cleared my Dexcom supplies , can you please resubmit the medical records and the prescription for it please it just got done today please it's been crazy with this insurance   Thank you very much

## 2022-10-21 ENCOUNTER — Encounter (INDEPENDENT_AMBULATORY_CARE_PROVIDER_SITE_OTHER): Payer: Self-pay | Admitting: Hospital

## 2022-10-21 NOTE — Telephone Encounter (Signed)
Insurance is still not assigned to Kimberly Curtis. Notified pt via Mychart.

## 2022-10-21 NOTE — Telephone Encounter (Signed)
Can you please run pt's insurance to see if it has been switched back to [place], thank you

## 2022-10-29 ENCOUNTER — Telehealth (INDEPENDENT_AMBULATORY_CARE_PROVIDER_SITE_OTHER): Payer: Self-pay | Admitting: Internal Medicine

## 2022-10-29 NOTE — Telephone Encounter (Addendum)
GENERAL INQUIRY:    Caller Information:  Who is calling? Patient and Yetta Numbers (RN, [facility])   Best way to contact: [phone]   What is the reason for the call? Zarina (RN, [facility]) is assist the patient. Patient currently has [employer] which is not contracted with [place]. Patient was seen in [place] and admitted for sxs. She is currently experience sxs and I notified Zarina (RN, [facility]) patient can be seen in [place] [facility]. Thank you!  Per patient, she will contact Medicare to assist with the change in insurance to Medicare A&B.   Encounter Details:  Is this a duplicate encounter?: No previous documentation found on this issue.  Action required by the office: No action needed. Routing to clinic as FYI  Patient Information:  Is MyChart active?: Yes.  Verification:  Has the inquiry been read verbatim to the caller and verbalizes satisfaction and confirms the above is accurate?: Yes  Caller has been advised this message will be transmitted to the office and can expect a response within the next 24-72 hours during working business days.  Encounter created by Samson Frederic from the Care Assist Team.  Signature generated from controlled access password on October 29, 2022 at 11:48 AM.

## 2022-11-22 ENCOUNTER — Telehealth (HOSPITAL_COMMUNITY): Payer: Self-pay | Admitting: Student in an Organized Health Care Education/Training Program

## 2022-11-22 NOTE — Telephone Encounter (Signed)
Received Vector alert of shock delivered x2 for VF and detected NSVT on 8/29.    Reviewed chart. Pt is currently admitted to [facility] after syncopal episode and witnessed device shock. She was admitted to [facility] from 8/29-8/31.  Tested for c diff, treated for mild hypokalemia, and negative for c diff, but recovering for recent c diff infection. Seen by Dr. Jenne Pane who recommend to start empiric steroids for possible cardiac sarcoidosis. Currently on streroids.    Attempted to contact at number listed, but no answer. Left VM.     Will message Dr. Chase Caller for awareness.     Alert uploaded to media tab.

## 2022-12-08 ENCOUNTER — Encounter (INDEPENDENT_AMBULATORY_CARE_PROVIDER_SITE_OTHER): Payer: 141 | Admitting: Heart Failure and Transplant Cardiology

## 2022-12-25 ENCOUNTER — Telehealth (INDEPENDENT_AMBULATORY_CARE_PROVIDER_SITE_OTHER): Payer: Self-pay | Admitting: Pulmonary Medicine

## 2022-12-25 NOTE — Telephone Encounter (Signed)
We do not manage patient's CPAP.  Called patient's PCP office to provide information.

## 2022-12-25 NOTE — Telephone Encounter (Signed)
Fleet Contras called from the PCP's office Dr. Jordan Likes requesting the patient's CPAP setting be faxed to their office.  Please assist, thank you.    '     c/b [phone]  fax# [phone]

## 2022-12-29 ENCOUNTER — Encounter (INDEPENDENT_AMBULATORY_CARE_PROVIDER_SITE_OTHER): Payer: Self-pay | Admitting: Clinical Cardiac Electrophysiology

## 2022-12-29 DIAGNOSIS — Z4502 Encounter for adjustment and management of automatic implantable cardiac defibrillator: Secondary | ICD-10-CM

## 2022-12-29 DIAGNOSIS — Z95810 Presence of automatic (implantable) cardiac defibrillator: Secondary | ICD-10-CM

## 2023-01-22 ENCOUNTER — Encounter: Payer: Self-pay | Admitting: Internal Medicine

## 2023-01-22 DIAGNOSIS — E1169 Type 2 diabetes mellitus with other specified complication: Secondary | ICD-10-CM

## 2023-01-23 ENCOUNTER — Encounter (INDEPENDENT_AMBULATORY_CARE_PROVIDER_SITE_OTHER): Payer: 141 | Admitting: Internal Medicine

## 2023-01-23 ENCOUNTER — Telehealth (INDEPENDENT_AMBULATORY_CARE_PROVIDER_SITE_OTHER): Payer: Self-pay | Admitting: Internal Medicine

## 2023-01-23 NOTE — Telephone Encounter (Signed)
01/23/23 8:11 am   Left voicemail for pt to confirm video call

## 2023-02-10 ENCOUNTER — Other Ambulatory Visit (INDEPENDENT_AMBULATORY_CARE_PROVIDER_SITE_OTHER): Payer: Self-pay | Admitting: Family

## 2023-02-10 DIAGNOSIS — G894 Chronic pain syndrome: Secondary | ICD-10-CM

## 2023-02-10 NOTE — Telephone Encounter (Signed)
[facility]       Controlled substances (Refill and PA requests) are not currently included in Pharmacy Refill Clinic protocol. Re-routing to appropriate staff for processing. Thank you.

## 2023-03-11 NOTE — Telephone Encounter (Signed)
A user error has taken place: encounter opened in error, closed for administrative reasons.

## 2023-03-19 ENCOUNTER — Other Ambulatory Visit (HOSPITAL_COMMUNITY): Payer: Self-pay | Admitting: Clinical Cardiac Electrophysiology

## 2023-03-19 DIAGNOSIS — I480 Paroxysmal atrial fibrillation: Secondary | ICD-10-CM

## 2023-03-19 NOTE — Telephone Encounter (Signed)
 Received RX refill request from: erequest    Medication(s): Eliquis     Last Cardio visit: 12/02/2021 Dr. Asuncion Blanc  10/02/2022 - phone call, see below    Next Cardio appt: 04/27/2023    [id number]    Last Clinic notes/Medication list reviewed:   Jonathan Neighbor, RN     CI    10/02/22  8:51 AM  Note      "  Left voicemail that her device interrogation saw PAF. Given her age, female, sex, HF, DM, HTN she is at increased risk of CVA. Will stop ASA and start Eliquis  5mg  twice daily.    "  Called and spoke with Kimberly Curtis, identification verified.     She received the voicemail. She just got out of the hospital issues with hypoglycemia regulating. She is concerned about starting the new medication, but education about it - she understands and plans to start the new medication. (Oldest brother died from stroke, older sister had stroke on each side of her brain, baby sister had a small stroke). She is aware to stop Aspirin , not to take any NSAID medications, ED if she falls/hits her head while on eliquis , bruising will be easier/small cuts will need more time to heal than normal but okay to continue taking the medication if just these. Main side effect of medication is bleeding. She can talk to her pharmacist more about it on pick-up.      She will start the medication, and plans to talk more about eliquis  with Dr. Jennelle Mocha on Wednesday.      Jonathan Neighbor, RN               Labs checked     Lab Results   Component Value Date    NA 141 03/28/2022    K 4.6 03/28/2022    CL 100 03/28/2022    BICARB 35 (H) 03/28/2022    BUN 16 03/28/2022    CREAT 0.78 03/28/2022    GLU 176 (H) 03/28/2022    Universal 9.6 03/28/2022         Comment:     Medication/s pended and Routed to: Dr. Asuncion Blanc

## 2023-03-30 ENCOUNTER — Encounter (INDEPENDENT_AMBULATORY_CARE_PROVIDER_SITE_OTHER): Payer: Self-pay | Admitting: Clinical Cardiac Electrophysiology

## 2023-03-30 DIAGNOSIS — Z95810 Presence of automatic (implantable) cardiac defibrillator: Secondary | ICD-10-CM

## 2023-03-30 DIAGNOSIS — Z4502 Encounter for adjustment and management of automatic implantable cardiac defibrillator: Secondary | ICD-10-CM

## 2023-03-31 ENCOUNTER — Encounter (INDEPENDENT_AMBULATORY_CARE_PROVIDER_SITE_OTHER): Payer: Self-pay | Admitting: Hospital

## 2023-04-08 ENCOUNTER — Telehealth: Payer: Self-pay | Admitting: Pharmacist

## 2023-04-08 NOTE — Telephone Encounter (Signed)
 Comprehensive Medication Review - Primary Care     Contacted patient via MyChart to schedule a CMR with Harlow Ohms, PharmD.     Sent MyChart message to patient. Provided contact number for [facility] MTM program & requested patient to call back if they wish to schedule appointment.     Attempt: 1    Kerney Elbe, CPhT  Pharmacy Technician III (MTM)  [facility]  Email: [email or web address]  Phone: [phone]

## 2023-04-20 NOTE — Telephone Encounter (Signed)
Comprehensive Medication Review - Primary Care     Contacted patient via MyChart to schedule a CMR with Harlow Ohms, PharmD.      Sent MyChart message to patient. Provided contact number for [facility] MTM program & requested patient to call back if they wish to schedule appointment.     Attempt: 2     Kerney Elbe, CPhT  Pharmacy Technician III (MTM)  [facility]  Email: [email or web address]  Phone: [phone]

## 2023-04-24 ENCOUNTER — Telehealth (INDEPENDENT_AMBULATORY_CARE_PROVIDER_SITE_OTHER): Payer: Self-pay | Admitting: Internal Medicine

## 2023-04-24 NOTE — Telephone Encounter (Signed)
GENERAL INQUIRY:    Caller Information:  Who is calling? Ancillary: anna from [employer] is calling on behalf of patient.  Best way to contact: [phone]   What is the reason for the call? Tobi Bastos states hydroxyzine will need a PA.  Verification:   Encounter Verification: Prior to creating this encounter, Chart  reviewed for previous documentation related to this call to ensure it was not a duplicate.  The caller has been informed that this message will be transmitted to the office, and they can expect a response within the next 24 to 72 hours during business days.  Encounter Created By: Vladimir Crofts from the Care Assist Team.  Signature Generated from Controlled Access Password on: April 24, 2023 at 9:55 AM.

## 2023-04-24 NOTE — Telephone Encounter (Signed)
Prior Auth Request for Hydroxyzine 50 mg has been placed in queue for processing. Please allow up to 72 business hours for initiation.     For GLP1(+/-GIP) medications, please allow up to 4-6 weeks for submission and determination.     If this is an urgent request due to immediate therapy please re-route as high priority.       Thank you!     [facility]   Refill and Prior Auth Clinical Services

## 2023-04-24 NOTE — Telephone Encounter (Signed)
Prior Auth Request submitted for Hydroxyzine on 04/24/23  via [facility] Key # [id number]    ** Please allow 3-5 business days for determination from insuranceThe [employer]ct[employer] & [employer] Plan:  Blue shield  Group:   RX ID:  [id number]   BIN:  [id number]  PCN:  [id number]    Diagnosis:  Itching  ICD-10:  l29.9  Medications Tried/Failed:    Justification:       PMAC will monitor status.     [facility]   Refill and Prior Auth Clinical Services

## 2023-04-27 ENCOUNTER — Encounter (HOSPITAL_COMMUNITY): Payer: Self-pay

## 2023-04-27 ENCOUNTER — Ambulatory Visit: Payer: BLUE CROSS/BLUE SHIELD | Admitting: Clinical Cardiac Electrophysiology

## 2023-04-27 NOTE — Telephone Encounter (Signed)
Prior Auth Request APPROVED for Hydroxyzine HCl (Atarax)        Valid until 03/23/2024    Pharmacy has been notified and will contact patient when ready to be picked up.     No further action needed, closing encounter.       Thank you!    [facility]   Refill and Prior Auth Clinical Services

## 2023-05-01 ENCOUNTER — Encounter (INDEPENDENT_AMBULATORY_CARE_PROVIDER_SITE_OTHER): Payer: Self-pay

## 2023-05-12 ENCOUNTER — Telehealth (INDEPENDENT_AMBULATORY_CARE_PROVIDER_SITE_OTHER): Payer: Self-pay | Admitting: Clinical Cardiac Electrophysiology

## 2023-05-12 NOTE — Telephone Encounter (Signed)
Left messages on both [place] and Alysia Penna (husband) that she needs [place] to ER as she has been having VF and appropriate shocks. Spoke to [employer] and were worried about active cardiac sarcoid and we would need immunosuppression.

## 2023-05-18 ENCOUNTER — Telehealth (HOSPITAL_COMMUNITY): Payer: Self-pay | Admitting: Clinical Cardiac Electrophysiology

## 2023-05-18 ENCOUNTER — Encounter (HOSPITAL_COMMUNITY): Payer: Self-pay | Admitting: Clinical Cardiac Electrophysiology

## 2023-05-18 NOTE — Telephone Encounter (Signed)
 Spoke to [employer]. She is admitted to [facility]. Her insurance changed and now she needs to be seen at [facility]. Asked her to see Dr Olene Floss and she can change her remotes to him.

## 2023-05-18 NOTE — Telephone Encounter (Signed)
 Comprehensive Medication Review - Primary Care     Contacted patient via MyChart to schedule a CMR with Harlow Ohms, PharmD.     Sent MyChart message to patient. Provided contact number for [facility] MTM program & requested patient to call back if they wish to schedule appointment.     Attempt: 3    Kerney Elbe, CPhT  Pharmacy Technician III (MTM)  [facility]  Email: [email or web address]  Phone: [phone]

## 2023-05-25 DIAGNOSIS — Z794 Long term (current) use of insulin: Secondary | ICD-10-CM

## 2023-06-05 ENCOUNTER — Telehealth (INDEPENDENT_AMBULATORY_CARE_PROVIDER_SITE_OTHER): Payer: Self-pay | Admitting: Internal Medicine

## 2023-06-05 ENCOUNTER — Other Ambulatory Visit (INDEPENDENT_AMBULATORY_CARE_PROVIDER_SITE_OTHER): Payer: Self-pay | Admitting: Family

## 2023-06-05 DIAGNOSIS — T7840XA Allergy, unspecified, initial encounter: Secondary | ICD-10-CM

## 2023-06-05 DIAGNOSIS — I10 Essential (primary) hypertension: Secondary | ICD-10-CM

## 2023-06-08 MED ORDER — FEXOFENADINE HCL 180 MG OR TABS
180.0000 mg | ORAL_TABLET | Freq: Every day | ORAL | 1 refills | Status: AC
Start: 2023-06-08 — End: ?

## 2023-06-08 NOTE — Telephone Encounter (Signed)
 Merge Fexofenadine  from another encounter          *The requested med passed all protocol parameters in the associated med info guide - Protocol details reviewed by pharmacist.       Beta Blocker Refill Protocol    Last visit in enc specialty: 01/23/2023 (No Show)    Recent Visits in This Encounter Department       Date Provider Department Visit Type Primary Dx    10/07/2022 Kirk Orene Abraham, NP [place] [facility] [place] [facility] Visit Type 2 diabetes mellitus with diabetic autonomic neuropathy, with long-term current use of insulin     08/07/2022 Obie Patee, MD [place] [facility] Health Telemedicine Screening mammogram for breast cancer    04/03/2022 Obie Patee, MD [place] [facility] Health Telemedicine Type 2 diabetes mellitus with other specified complication, with long-term current use of insulin     03/28/2022 Obie Patee, MD [place] [facility] [place] [facility] Visit Type 2 diabetes mellitus with other specified complication, with long-term current use of insulin     01/29/2022 Obie Patee, MD [place] [facility] [place] [facility] Visit Type 2 diabetes mellitus with other specified complication, with long-term current use of insulin            Population Health Visits  Recent [facility] Visits    None       Next appt in enc specialty: Visit date not found      Future Appointments 06/08/2023 - 06/06/2028      None                LABS required:  (None)    Monitoring required:  (Q year BP, HR)    (BP range: Systolic=90-150  Ipjdunopr=49-09)  Blood Pressure   10/07/22 130/64   03/31/22 125/71   03/28/22 119/64       (HR range: 55-110)  Pulse Readings from Last 3 Encounters:   10/07/22 77   03/31/22 61   03/28/22 63         Last Digital Health Monitoring Vitals:        Last MyChart BP Values:        Last Pt Entered MyChart BP Values:         Sherie Mae Francella, CPhT  (Rx Refill [facility])      Antihistamine Refill Protocol    Last visit in enc  specialty: 01/23/2023 (No Show)    Recent Visits in This Encounter Department       Date Provider Department Visit Type Primary Dx    10/07/2022 Kirk Orene Abraham, NP [place] [facility] [place] [facility] Visit Type 2 diabetes mellitus with diabetic autonomic neuropathy, with long-term current use of insulin     08/07/2022 Obie Patee, MD [place] [facility] Health Telemedicine Screening mammogram for breast cancer    04/03/2022 Obie Patee, MD [place] [facility] Health Telemedicine Type 2 diabetes mellitus with other specified complication, with long-term current use of insulin     03/28/2022 Obie Patee, MD [place] [facility] [place] [facility] Visit Type 2 diabetes mellitus with other specified complication, with long-term current use of insulin     01/29/2022 Obie Patee, MD  [facility] [place] [facility] Visit Type 2 diabetes mellitus with other specified complication, with long-term current use of insulin            Population Health Visits  Recent [facility] Visits    None       Next appt in enc specialty: Visit date not found  Future Appointments 06/08/2023 - 06/06/2028      None                LABS required:  (None)    Monitoring required:  (None)

## 2023-06-08 NOTE — Telephone Encounter (Signed)
 Scheduling Request:   [facility]     Please remind pt to schedule f/u appt w Dr Obie Patee, for 6 month f/u for DM (video visit if appropriate), due on or after 04/09/2023.     *Please inform pt to wait to complete any ordered labs until AFTER their scheduled visit, unless otherwise instructed by their provider*

## 2023-06-08 NOTE — Telephone Encounter (Signed)
 Sent mychart message and Left message for patient to call back.  Call Center agent, if applicable please assist with:  Relaying Message  Book appointment if needed  Verify best way to reach patient  Please don't create new encounter.  Thank you.

## 2023-06-19 ENCOUNTER — Other Ambulatory Visit (INDEPENDENT_AMBULATORY_CARE_PROVIDER_SITE_OTHER): Payer: Self-pay | Admitting: Internal Medicine

## 2023-06-19 DIAGNOSIS — R110 Nausea: Secondary | ICD-10-CM

## 2023-06-22 NOTE — Telephone Encounter (Signed)
[place] [facility]     zofran is not currently included in the Pharmacy Refill Clinic protocols. Re-routing to the responsible staff for processing.  Thank you

## 2023-06-29 ENCOUNTER — Encounter (INDEPENDENT_AMBULATORY_CARE_PROVIDER_SITE_OTHER): Payer: Self-pay | Admitting: Clinical Cardiac Electrophysiology

## 2023-06-29 DIAGNOSIS — Z45018 Encounter for adjustment and management of other part of cardiac pacemaker: Secondary | ICD-10-CM

## 2023-06-29 DIAGNOSIS — Z95810 Presence of automatic (implantable) cardiac defibrillator: Secondary | ICD-10-CM

## 2023-07-11 ENCOUNTER — Other Ambulatory Visit (INDEPENDENT_AMBULATORY_CARE_PROVIDER_SITE_OTHER): Payer: Self-pay | Admitting: Internal Medicine

## 2023-07-11 DIAGNOSIS — R110 Nausea: Secondary | ICD-10-CM

## 2023-07-13 NOTE — Telephone Encounter (Signed)
[place] [facility]     Zofran is not currently included in the Pharmacy Refill Clinic protocols. Re-routing to the responsible staff for processing.  Thank you

## 2023-07-15 ENCOUNTER — Telehealth (INDEPENDENT_AMBULATORY_CARE_PROVIDER_SITE_OTHER): Payer: Self-pay | Admitting: Family

## 2023-07-15 DIAGNOSIS — E782 Mixed hyperlipidemia: Secondary | ICD-10-CM

## 2023-07-21 NOTE — Telephone Encounter (Signed)
 Posey Broach, CPhT  (Rx Refill [facility])      [place] Refill Protocol    Last visit in enc specialty: 01/23/2023 NO SHOW     Recent Visits in This Encounter Department       Date Provider Department Visit Type Primary Dx    10/07/2022 Marylee Snowball, NP [place] [facility] [place] [facility] Visit Type 2 diabetes mellitus with diabetic autonomic neuropathy, with long-term current use of insulin     08/07/2022 Fern Hover, MD [place] [facility] Health Telemedicine Screening mammogram for breast cancer    04/03/2022 Fern Hover, MD [place] [facility] Health Telemedicine Type 2 diabetes mellitus with other specified complication, with long-term current use of insulin     03/28/2022 Fern Hover, MD [place] [facility] [place] [facility] Visit Type 2 diabetes mellitus with other specified complication, with long-term current use of insulin     01/29/2022 Fern Hover, MD [place] [facility] [place] [facility] Visit Type 2 diabetes mellitus with other specified complication, with long-term current use of insulin            Population Health Visits  Recent [facility] Visits    None       Next f/u appt due:  Return Keep appt with PCP on Thursday.     Next appt in enc specialty: Visit date not found      Future Appointments 07/21/2023 - 07/19/2028      None                LABS required:  (Q year Lipid Panel, ALT (baseline only))    Per labs 04/18/23      Lab Results   Component Value Date    CHOL 315 (H) 03/28/2022    TRIG 200 (H) 03/28/2022    HDL 39 03/28/2022    NHDLV 276 03/28/2022    LDLCALC 236 (H) 03/28/2022        Lab Results   Component Value Date    ALT 17 03/28/2022         Monitoring required:  (None)

## 2023-07-21 NOTE — Telephone Encounter (Signed)
 Scheduling Request:   [facility] [facility]     Please remind pt to schedule f/u appt w Dr Fern Hover, for annual follow up (video visit if appropriate), due on or after 10/07/2023. Noted pt was no show to 01/23/2023 appt.     *Please inform pt to wait to complete any ordered labs until AFTER their scheduled visit, unless otherwise instructed by their provider*    Authorized 90 days supply + 0 RF until f/u scheduled.

## 2023-07-27 NOTE — Telephone Encounter (Signed)
 Comprehensive Medication Review - Primary Care([employer])     Contacted patient via telephone to schedule a CMR     Was unable to reach telephone and left voice mail on mobile phone number.     Provided contact number for [facility] MTM program & requested patient to call back if they wish to schedule appointment.     Attempt: 4      Madelyn Schick, CPhT  Pharmacy Technician III (MTM)  [facility]  Email: [email or web address]  Phone: [phone]

## 2023-08-03 NOTE — Telephone Encounter (Signed)
 Comprehensive Medication Review - Primary Care [facility])     Contacted patient via telephone to schedule a CMR      Was unable to reach telephone and left voice mail on mobile phone number.      Provided contact number for [facility] MTM program & requested patient to call back if they wish to schedule appointment.     Attempt: 5        Madelyn Schick, CPhT  Pharmacy Technician III (MTM)  [facility]  Email: [email or web address]  Phone: [phone]

## 2023-09-22 NOTE — Addendum Note (Signed)
 Addended by: LAVONIA PINE on: 09/22/2023 06:29 PM     Modules accepted: Orders

## 2023-09-22 NOTE — Telephone Encounter (Signed)
 Population Health Services Quality Outreach     Ordering Labs & Screening Tests    Situation:  The patient is due for routine screening and diagnostic tests based on clinical guidelines and medical history, including identified care gaps and health maintenance needs.    Background:  The patient has been identified through population health outreach as being due for preventive labs and screening based on risk factors and care gap guidelines.     Comprehensive Metabolic Panel (CMP)-ordered to evaluate kidney and liver function, electrolyte balance, and glucose levels as part of routine monitoring and preventive care., Microalbumin (Urine Albumin-to-Creatinine Ratio) - ordered to assess early kidney function and detect microalbuminuria in patients with diabetes or hypertension., and Diabetic Retinal Exam - ordered to screen for diabetic retinopathy and monitor ocular health in patients with diabetes.    Assessment:  Based on the patient's medical history, risk profile, and preventive care recommendations, ordering these tests is clinically appropriate.    Recommendation:  Proceed with placing the indicated labs, tests, and screenings under the Health Maintenance Screening Order Protocol (HMSOP), as guided by [facility] RN protocols.  An MA or RN will follow up with the patient to inform them about the referrals and additional preventive care items.

## 2023-09-23 ENCOUNTER — Encounter (INDEPENDENT_AMBULATORY_CARE_PROVIDER_SITE_OTHER): Payer: Self-pay | Admitting: Hospital

## 2023-09-23 ENCOUNTER — Other Ambulatory Visit: Payer: Self-pay

## 2023-09-23 ENCOUNTER — Encounter (INDEPENDENT_AMBULATORY_CARE_PROVIDER_SITE_OTHER): Payer: Self-pay | Admitting: Internal Medicine

## 2023-09-23 NOTE — Telephone Encounter (Signed)
 Population Health Services Quality Outreach       Subjective  The CMA contacted regarding Care Gap    HM Care Gaps Due:   Health Maintenance Due   Topic Date Due    Breast Cancer Screen  Never done    Pneumococcal Vaccine (1 of 2 - PCV) Never done    Shingles Vaccine (1 of 2) Never done    Diabetic Retinal Exam  03/27/2022    Medicare Annual Wellness Visit  Never done    Diabetes Urine Microalbumin  08/29/2022    Osteoporosis Screening  Never done    Advance Care Planning  Never done    COVID-19 Vaccine (1 - 2024-25 season) Never done    Diabetic Foot Exam  12/21/2022    Diabetes CMP  03/29/2023    PHQ2 depression screen  04/09/2023        Contact Outcome:Unable to reach patient. Left voice mail with direct contact information provided.  Please confirm that patient is still being seen with [facility].    OUTREACH PURPOSE: Diabetic eye exam and Diabetic labs     Upcoming Follow-Up Visits:   No future visits with PCP  Visit date not found      Plan: Will continue to follow up

## 2023-10-12 ENCOUNTER — Telehealth (HOSPITAL_COMMUNITY): Payer: Self-pay | Admitting: Clinical Cardiac Electrophysiology

## 2023-10-12 NOTE — Telephone Encounter (Signed)
 Vector Yellow Alert:      The following preliminary summary is pending physician review and final interpretation. [employer]. Alert for: Possible pacing on T waves. AP: 24%. VP: 68%. Mode: DDD. No episodes. Atrial Burden: 0.9%. Presenting ECG with blanked PVCs causing VP to possible fall on or near T waves, please review. Normal device function.   Battery 9 yrs

## 2023-11-11 ENCOUNTER — Encounter (INDEPENDENT_AMBULATORY_CARE_PROVIDER_SITE_OTHER): Payer: Self-pay | Admitting: Internal Medicine

## 2023-11-19 ENCOUNTER — Telehealth (INDEPENDENT_AMBULATORY_CARE_PROVIDER_SITE_OTHER): Payer: Self-pay | Admitting: Internal Medicine

## 2023-11-19 DIAGNOSIS — L299 Pruritus, unspecified: Secondary | ICD-10-CM

## 2023-11-20 MED ORDER — HYDROXYZINE HCL 50 MG OR TABS
50.0000 mg | ORAL_TABLET | Freq: Two times a day (BID) | ORAL | 0 refills | Status: AC | PRN
Start: 2023-11-20 — End: ?

## 2023-11-20 NOTE — Telephone Encounter (Signed)
 Sent mychart message and Left message for patient to call back.  Call Center agent, if applicable please assist with:  Relaying Message  Book appointment if needed  Verify best way to reach patient  Please don't create new encounter.  Thank you.

## 2023-11-20 NOTE — Telephone Encounter (Addendum)
 Kimberly Curtis, CPhT  (Rx Ref[facility])      Antihistamine Refill Protocol    Last visit in enc specialty: 01/23/2023 (No Show)    Recent Visits in This Encounter Department       Date Provider Department Visit Type Primary Dx    10/07/2022 Kirk Orene Abraham, NP [place] [facility] [place] [facility] Visit Type 2 diabetes mellitus with diabetic autonomic neuropathy, with long-term current use of insulin     08/07/2022 Obie Patee, MD [place] [facility] Health Telemedicine Screening mammogram for breast cancer    04/03/2022 Obie Patee, MD [place] [facility] Health Telemedicine Type 2 diabetes mellitus with other specified complication, with long-term current use of insulin     03/28/2022 Obie Patee, MD [place] [facility] [place] [facility] Visit Type 2 diabetes mellitus with other specified complication, with long-term current use of insulin     01/29/2022 Obie Patee, MD [place] [facility] [place] [facility] Visit Type 2 diabetes mellitus with other specified complication, with long-term current use of insulin            Recommended Follow up From Recent Visits       Date Provider Department Visit Type Follow Up    10/07/2022 Navarro, Blanca Arzadon, NP [place] [facility] Visit Return Keep appt with PCP on Thursday.      08/07/2022 Diaz-GonzalezPatee, MD [place] [facility] Telemedicine No Follow-up on file.    04/03/2022 Diaz-Gonzalez, Patee, MD [place] [facility] Telemedicine No Follow-up on file.    03/28/2022 Diaz-GonzalezPatee, MD [place] [facility] [place] [facility] Visit No Follow-up on file.    01/29/2022 Diaz-GonzalezPatee, MD [place] [facility] [place] [facility] Visit No Follow-up on file.           Population Health Visits  Recent [facility] Visits    None       Next appt in enc specialty: Visit date not found      Future Appointments 11/20/2023 - 11/18/2028      None                 LABS required:  (None)    Monitoring required:  (None)

## 2023-11-20 NOTE — Telephone Encounter (Signed)
 Scheduling Request:   [place] [facility]     Please call to schedule f/u appt w Dr Obie Patee, for annual follow up (video visit if appropriate), due on or after 10/07/2023. Noted pt was no show to 01/23/2023 appt.     **Please note: pt does not appear to regularly read MyChart reminders, please call or send letter**      *Please inform pt to wait to complete any ordered labs until AFTER their scheduled visit, unless otherwise instructed by their provider*    Authorized 90 days supply + 0 RF until f/u scheduled.

## 2023-12-08 ENCOUNTER — Telehealth (INDEPENDENT_AMBULATORY_CARE_PROVIDER_SITE_OTHER): Payer: Self-pay | Admitting: Internal Medicine

## 2023-12-08 NOTE — Telephone Encounter (Signed)
 Patient no longer with [facility]; Removed from Dr Obie panel accordingly.    Last noted PCP:  MANCEL DUTCH  NPI: [id number]  [phone]  [address]  [address]  [place], [place] [id number]     Per 07/27/23 OV: Patient is moving out of state to [place] , will give 3 months of medication supply until patient is able to establish care with PCP in the new state.     Josette Ned   December 08, 2023, 10:53 AM.

## 2023-12-16 ENCOUNTER — Other Ambulatory Visit (INDEPENDENT_AMBULATORY_CARE_PROVIDER_SITE_OTHER): Payer: Self-pay | Admitting: Family

## 2023-12-16 DIAGNOSIS — D869 Sarcoidosis, unspecified: Secondary | ICD-10-CM

## 2023-12-22 ENCOUNTER — Encounter (INDEPENDENT_AMBULATORY_CARE_PROVIDER_SITE_OTHER): Payer: Self-pay

## 2024-01-01 ENCOUNTER — Other Ambulatory Visit (INDEPENDENT_AMBULATORY_CARE_PROVIDER_SITE_OTHER): Payer: Self-pay | Admitting: Internal Medicine

## 2024-01-01 DIAGNOSIS — D869 Sarcoidosis, unspecified: Secondary | ICD-10-CM

## 2024-01-05 ENCOUNTER — Other Ambulatory Visit: Payer: Self-pay

## 2024-01-05 NOTE — Telephone Encounter (Signed)
 Population Health Services Quality Outreach       Subjective  The MA contacted regarding Blood Pressure    HM Care Gaps Due:   Health Maintenance Due   Topic Date Due    Breast Cancer Screen  Never done    Pneumococcal Vaccine (1 of 2 - PCV) Never done    Shingles Vaccine (1 of 2) Never done    Diabetic Retinal Exam  03/27/2022    Medicare Annual Wellness Visit  Never done    Diabetes Urine Microalbumin  08/29/2022    Osteoporosis Screening  Never done    Diabetic Foot Exam  12/21/2022    Diabetes CMP  03/29/2023    Diabetes A1c  11/09/2023    COVID-19 Vaccine (1 - 2024-25 season) Never done        Contact Outcome:Unable to reach patient. Left voice mail with direct contact information provided.     OUTREACH PURPOSE: Blood Pressure follow up     Upcoming Follow-Up Visits:   No future visits with PCP  Visit date not found      Plan: Will continue to follow up

## 2024-04-14 ENCOUNTER — Other Ambulatory Visit (INDEPENDENT_AMBULATORY_CARE_PROVIDER_SITE_OTHER): Payer: Self-pay | Admitting: Internal Medicine

## 2024-04-14 DIAGNOSIS — L299 Pruritus, unspecified: Secondary | ICD-10-CM

## 2024-04-16 NOTE — Telephone Encounter (Signed)
 Subsequent Refill Request:     At last request, the pt was provided:    90 days supply + 0 RF      and instructed to:     Schedule Follow Up Appt      Was pt successfully contacted by the office?      No:   MyChart msg sent but UNREAD - pend qty from previous Rx      Has the pt completed the requested action?       No -  Appt NOT scheduled:   Pend 30ds + 0RF and route      Last visit in enc specialty: 01/23/2023 No Show       Recent Visits in This Encounter Department       Date Provider Department Visit Type Primary Dx    10/07/2022 Kirk Orene Abraham, NP [place] [facility] [place] [facility] Visit Type 2 diabetes mellitus with diabetic autonomic neuropathy, with long-term current use of insulin     08/07/2022 Obie Patee, MD [place] [facility] Health Telemedicine Screening mammogram for breast cancer    04/03/2022 Obie Patee, MD [place] [facility] Telemedicine Type 2 diabetes mellitus with other specified complication, with long-term current use of insulin     03/28/2022 Obie Patee, MD [place] [facility] [place] [facility] Visit Type 2 diabetes mellitus with other specified complication, with long-term current use of insulin     01/29/2022 Obie Patee, MD [place] [facility] [place] [facility] Visit Type 2 diabetes mellitus with other specified complication, with long-term current use of insulin            Recommended Follow up From Recent Visits       Date Provider Department Visit Type Follow Up    10/07/2022 Kirk Orene Abraham, NP [place] [facility] Visit Return Keep appt with PCP on Thursday.      08/07/2022 Diaz-GonzalezPatee, MD [place] [facility] Telemedicine No Follow-up on file.    04/03/2022 Diaz-Gonzalez, Patee, MD [place] [facility] Telemedicine No Follow-up on file.    03/28/2022 Diaz-GonzalezPatee, MD [place] [facility] [place] [facility] Visit No Follow-up on file.     01/29/2022 Diaz-GonzalezPatee, [place]  [facility] [place] [facility] Visit No Follow-up on file.           Population Health Visits  Recent [facility] Visits    None       Next appt in enc specialty: Visit date not found      Future Appointments 04/16/2024 - 04/15/2029      None                Paste FINAL communication to/from pt, below (ONLY if not in last encounter):  ^^^^^^^^^^^^^^^^^^^^^^^^^^^^^^^^^^^^^^^^^^^^^^^^^^^^^^^^^^^^^^^^^^^^^^^^^^^^^^  Per MyChart/Tele note on:
